# Patient Record
Sex: Female | Born: 1950 | Race: White | Hispanic: No | Smoking: Never smoker
Health system: Southern US, Community
[De-identification: ages and names within clinical notes are randomized; demographics above are authoritative.]

## PROBLEM LIST (undated history)

## (undated) DIAGNOSIS — E039 Hypothyroidism, unspecified: Secondary | ICD-10-CM

## (undated) DIAGNOSIS — G629 Polyneuropathy, unspecified: Secondary | ICD-10-CM

## (undated) DIAGNOSIS — I10 Essential (primary) hypertension: Secondary | ICD-10-CM

## (undated) DIAGNOSIS — E538 Deficiency of other specified B group vitamins: Secondary | ICD-10-CM

## (undated) DIAGNOSIS — F32A Depression, unspecified: Secondary | ICD-10-CM

## (undated) DIAGNOSIS — A419 Sepsis, unspecified organism: Secondary | ICD-10-CM

## (undated) DIAGNOSIS — F419 Anxiety disorder, unspecified: Secondary | ICD-10-CM

## (undated) DIAGNOSIS — M549 Dorsalgia, unspecified: Secondary | ICD-10-CM

## (undated) DIAGNOSIS — E079 Disorder of thyroid, unspecified: Secondary | ICD-10-CM

## (undated) DIAGNOSIS — F329 Major depressive disorder, single episode, unspecified: Secondary | ICD-10-CM

## (undated) DIAGNOSIS — F319 Bipolar disorder, unspecified: Secondary | ICD-10-CM

## (undated) HISTORY — PX: GASTRIC BYPASS: SHX52

## (undated) HISTORY — PX: HERNIA REPAIR: SHX51

## (undated) HISTORY — PX: OTHER SURGICAL HISTORY: SHX169

## (undated) HISTORY — PX: CHOLECYSTECTOMY: SHX55

## (undated) HISTORY — PX: ABDOMINAL HYSTERECTOMY: SHX81

## (undated) HISTORY — PX: LUMBAR FUSION: SHX111

---

## 2013-04-28 ENCOUNTER — Emergency Department: Payer: Self-pay | Admitting: Emergency Medicine

## 2013-04-28 LAB — COMPREHENSIVE METABOLIC PANEL
Albumin: 1.8 g/dL — ABNORMAL LOW (ref 3.4–5.0)
Alkaline Phosphatase: 183 U/L — ABNORMAL HIGH (ref 50–136)
Anion Gap: 7 (ref 7–16)
BUN: 10 mg/dL (ref 7–18)
Bilirubin,Total: 0.4 mg/dL (ref 0.2–1.0)
Calcium, Total: 8.1 mg/dL — ABNORMAL LOW (ref 8.5–10.1)
Chloride: 103 mmol/L (ref 98–107)
Co2: 31 mmol/L (ref 21–32)
Creatinine: 1.26 mg/dL (ref 0.60–1.30)
EGFR (African American): 54 — ABNORMAL LOW
EGFR (Non-African Amer.): 47 — ABNORMAL LOW
Glucose: 139 mg/dL — ABNORMAL HIGH (ref 65–99)
Osmolality: 283 (ref 275–301)
Potassium: 3.6 mmol/L (ref 3.5–5.1)
SGOT(AST): 22 U/L (ref 15–37)
SGPT (ALT): 21 U/L (ref 12–78)
Sodium: 141 mmol/L (ref 136–145)
Total Protein: 5.2 g/dL — ABNORMAL LOW (ref 6.4–8.2)

## 2013-04-28 LAB — CBC
HCT: 32.9 % — ABNORMAL LOW (ref 35.0–47.0)
HGB: 11.3 g/dL — ABNORMAL LOW (ref 12.0–16.0)
MCH: 32.2 pg (ref 26.0–34.0)
MCHC: 34.3 g/dL (ref 32.0–36.0)
MCV: 94 fL (ref 80–100)
Platelet: 339 10*3/uL (ref 150–440)
RBC: 3.49 10*6/uL — ABNORMAL LOW (ref 3.80–5.20)
RDW: 14.6 % — ABNORMAL HIGH (ref 11.5–14.5)
WBC: 6.9 10*3/uL (ref 3.6–11.0)

## 2013-04-28 LAB — URINALYSIS, COMPLETE
Bilirubin,UR: NEGATIVE
Blood: NEGATIVE
Glucose,UR: NEGATIVE mg/dL (ref 0–75)
Ketone: NEGATIVE
Nitrite: NEGATIVE
Ph: 6 (ref 4.5–8.0)
Protein: NEGATIVE
RBC,UR: 1 /HPF (ref 0–5)
Specific Gravity: 1.011 (ref 1.003–1.030)
Squamous Epithelial: 10
WBC UR: 47 /HPF (ref 0–5)

## 2013-04-28 LAB — LIPASE, BLOOD: Lipase: 46 U/L — ABNORMAL LOW (ref 73–393)

## 2013-10-25 ENCOUNTER — Ambulatory Visit: Payer: Self-pay | Admitting: Gastroenterology

## 2013-11-26 ENCOUNTER — Ambulatory Visit: Payer: Self-pay

## 2015-03-28 ENCOUNTER — Ambulatory Visit: Admit: 2015-03-28 | Disposition: A | Discharge status: Home / Self Care | Payer: Self-pay

## 2016-10-03 ENCOUNTER — Other Ambulatory Visit: Payer: Self-pay | Admitting: Internal Medicine

## 2016-10-03 DIAGNOSIS — Z1231 Encounter for screening mammogram for malignant neoplasm of breast: Secondary | ICD-10-CM

## 2016-10-04 ENCOUNTER — Other Ambulatory Visit: Payer: Self-pay | Admitting: Internal Medicine

## 2016-10-04 ENCOUNTER — Ambulatory Visit
Admission: RE | Admit: 2016-10-04 | Discharge: 2016-10-04 | Disposition: A | Discharge status: Home / Self Care | Payer: Medicare PPO | Source: Ambulatory Visit | Attending: Internal Medicine | Admitting: Internal Medicine

## 2016-10-04 DIAGNOSIS — Z1231 Encounter for screening mammogram for malignant neoplasm of breast: Secondary | ICD-10-CM

## 2017-04-28 ENCOUNTER — Observation Stay
Admission: EM | Admit: 2017-04-28 | Discharge: 2017-04-30 | Disposition: A | Discharge status: Home / Self Care | Payer: Medicare PPO | Attending: Internal Medicine | Admitting: Internal Medicine

## 2017-04-28 ENCOUNTER — Emergency Department: Payer: Medicare PPO

## 2017-04-28 ENCOUNTER — Encounter: Payer: Self-pay | Admitting: Emergency Medicine

## 2017-04-28 DIAGNOSIS — Z79899 Other long term (current) drug therapy: Secondary | ICD-10-CM | POA: Diagnosis not present

## 2017-04-28 DIAGNOSIS — G8929 Other chronic pain: Secondary | ICD-10-CM | POA: Diagnosis not present

## 2017-04-28 DIAGNOSIS — Z888 Allergy status to other drugs, medicaments and biological substances status: Secondary | ICD-10-CM | POA: Insufficient documentation

## 2017-04-28 DIAGNOSIS — D500 Iron deficiency anemia secondary to blood loss (chronic): Secondary | ICD-10-CM | POA: Insufficient documentation

## 2017-04-28 DIAGNOSIS — Z880 Allergy status to penicillin: Secondary | ICD-10-CM | POA: Diagnosis not present

## 2017-04-28 DIAGNOSIS — Z801 Family history of malignant neoplasm of trachea, bronchus and lung: Secondary | ICD-10-CM | POA: Insufficient documentation

## 2017-04-28 DIAGNOSIS — K625 Hemorrhage of anus and rectum: Secondary | ICD-10-CM | POA: Diagnosis not present

## 2017-04-28 DIAGNOSIS — Z7982 Long term (current) use of aspirin: Secondary | ICD-10-CM | POA: Insufficient documentation

## 2017-04-28 DIAGNOSIS — Z9884 Bariatric surgery status: Secondary | ICD-10-CM | POA: Diagnosis not present

## 2017-04-28 DIAGNOSIS — I700 Atherosclerosis of aorta: Secondary | ICD-10-CM | POA: Insufficient documentation

## 2017-04-28 DIAGNOSIS — Z9049 Acquired absence of other specified parts of digestive tract: Secondary | ICD-10-CM | POA: Insufficient documentation

## 2017-04-28 DIAGNOSIS — K5730 Diverticulosis of large intestine without perforation or abscess without bleeding: Secondary | ICD-10-CM | POA: Diagnosis not present

## 2017-04-28 DIAGNOSIS — K449 Diaphragmatic hernia without obstruction or gangrene: Secondary | ICD-10-CM | POA: Diagnosis not present

## 2017-04-28 DIAGNOSIS — M549 Dorsalgia, unspecified: Secondary | ICD-10-CM | POA: Insufficient documentation

## 2017-04-28 DIAGNOSIS — K640 First degree hemorrhoids: Secondary | ICD-10-CM | POA: Insufficient documentation

## 2017-04-28 DIAGNOSIS — E039 Hypothyroidism, unspecified: Secondary | ICD-10-CM | POA: Insufficient documentation

## 2017-04-28 DIAGNOSIS — K922 Gastrointestinal hemorrhage, unspecified: Secondary | ICD-10-CM | POA: Diagnosis present

## 2017-04-28 DIAGNOSIS — E785 Hyperlipidemia, unspecified: Secondary | ICD-10-CM | POA: Insufficient documentation

## 2017-04-28 DIAGNOSIS — D649 Anemia, unspecified: Secondary | ICD-10-CM | POA: Diagnosis present

## 2017-04-28 DIAGNOSIS — Z90710 Acquired absence of both cervix and uterus: Secondary | ICD-10-CM | POA: Diagnosis not present

## 2017-04-28 DIAGNOSIS — F329 Major depressive disorder, single episode, unspecified: Secondary | ICD-10-CM | POA: Insufficient documentation

## 2017-04-28 HISTORY — DX: Major depressive disorder, single episode, unspecified: F32.9

## 2017-04-28 HISTORY — DX: Disorder of thyroid, unspecified: E07.9

## 2017-04-28 HISTORY — DX: Dorsalgia, unspecified: M54.9

## 2017-04-28 HISTORY — DX: Depression, unspecified: F32.A

## 2017-04-28 LAB — COMPREHENSIVE METABOLIC PANEL
ALT: 16 U/L (ref 14–54)
AST: 19 U/L (ref 15–41)
Albumin: 2.9 g/dL — ABNORMAL LOW (ref 3.5–5.0)
Alkaline Phosphatase: 81 U/L (ref 38–126)
Anion gap: 8 (ref 5–15)
BUN: 41 mg/dL — ABNORMAL HIGH (ref 6–20)
CO2: 22 mmol/L (ref 22–32)
Calcium: 7.9 mg/dL — ABNORMAL LOW (ref 8.9–10.3)
Chloride: 105 mmol/L (ref 101–111)
Creatinine, Ser: 1.36 mg/dL — ABNORMAL HIGH (ref 0.44–1.00)
GFR calc Af Amer: 47 mL/min — ABNORMAL LOW (ref >60)
GFR calc non Af Amer: 40 mL/min — ABNORMAL LOW (ref >60)
Glucose, Bld: 223 mg/dL — ABNORMAL HIGH (ref 65–99)
Potassium: 4.3 mmol/L (ref 3.5–5.1)
Sodium: 135 mmol/L (ref 135–145)
Total Bilirubin: 0.6 mg/dL (ref 0.3–1.2)
Total Protein: 5.5 g/dL — ABNORMAL LOW (ref 6.5–8.1)

## 2017-04-28 LAB — CBC WITH DIFFERENTIAL/PLATELET
Basophils Absolute: 0 10*3/uL (ref 0–0.1)
Basophils Relative: 0 %
Eosinophils Absolute: 0.1 10*3/uL (ref 0–0.7)
Eosinophils Relative: 0 %
HCT: 27 % — ABNORMAL LOW (ref 35.0–47.0)
Hemoglobin: 8.9 g/dL — ABNORMAL LOW (ref 12.0–16.0)
Lymphocytes Relative: 9 %
Lymphs Abs: 1.3 10*3/uL (ref 1.0–3.6)
MCH: 31.5 pg (ref 26.0–34.0)
MCHC: 33 g/dL (ref 32.0–36.0)
MCV: 95.4 fL (ref 80.0–100.0)
Monocytes Absolute: 0.7 10*3/uL (ref 0.2–0.9)
Monocytes Relative: 5 %
Neutro Abs: 12.7 10*3/uL — ABNORMAL HIGH (ref 1.4–6.5)
Neutrophils Relative %: 86 %
Platelets: 348 10*3/uL (ref 150–440)
RBC: 2.83 MIL/uL — ABNORMAL LOW (ref 3.80–5.20)
RDW: 12.7 % (ref 11.5–14.5)
WBC: 14.8 10*3/uL — ABNORMAL HIGH (ref 3.6–11.0)

## 2017-04-28 LAB — URINALYSIS, COMPLETE (UACMP) WITH MICROSCOPIC
Bacteria, UA: NONE SEEN
Bilirubin Urine: NEGATIVE
Glucose, UA: NEGATIVE mg/dL
Ketones, ur: NEGATIVE mg/dL
Leukocytes, UA: NEGATIVE
Nitrite: NEGATIVE
Protein, ur: NEGATIVE mg/dL
RBC / HPF: NONE SEEN RBC/hpf (ref 0–5)
Specific Gravity, Urine: 1.014 (ref 1.005–1.030)
pH: 5 (ref 5.0–8.0)

## 2017-04-28 LAB — PREPARE RBC (CROSSMATCH)

## 2017-04-28 LAB — TROPONIN I: Troponin I: <0.03 ng/mL (ref <0.03)

## 2017-04-28 LAB — LIPASE, BLOOD: Lipase: 17 U/L (ref 11–51)

## 2017-04-28 LAB — ABO/RH: ABO/RH(D): O POS

## 2017-04-28 LAB — MRSA PCR SCREENING: MRSA by PCR: NEGATIVE

## 2017-04-28 MED ORDER — PANCRELIPASE (LIP-PROT-AMYL) 12000-38000 UNITS PO CPEP
24000.0000 [IU] | ORAL_CAPSULE | Freq: Three times a day (TID) | ORAL | Status: DC
  Administered 2017-04-29 – 2017-04-30 (×4): 24000 [IU] via ORAL
  Filled 2017-04-28 (×6): qty 2

## 2017-04-28 MED ORDER — CYANOCOBALAMIN 1000 MCG/ML IJ SOLN
1000.0000 ug | INTRAMUSCULAR | Status: DC
Start: 2017-05-16 — End: 2017-04-30

## 2017-04-28 MED ORDER — VENLAFAXINE HCL ER 75 MG PO CP24
150.0000 mg | ORAL_CAPSULE | Freq: Every evening | ORAL | Status: DC
  Administered 2017-04-28 – 2017-04-29 (×2): 150 mg via ORAL
  Filled 2017-04-28 (×2): qty 2

## 2017-04-28 MED ORDER — LEVOTHYROXINE SODIUM 25 MCG PO TABS
25.0000 ug | ORAL_TABLET | Freq: Every day | ORAL | Status: DC
  Administered 2017-04-29 – 2017-04-30 (×2): 25 ug via ORAL
  Filled 2017-04-28 (×2): qty 1

## 2017-04-28 MED ORDER — SODIUM CHLORIDE 0.9 % IV SOLN
Freq: Once | INTRAVENOUS | Status: AC
  Administered 2017-04-28: 13:00:00 via INTRAVENOUS

## 2017-04-28 MED ORDER — DOCUSATE SODIUM 100 MG PO CAPS: 100.0000 mg | ORAL_CAPSULE | Freq: Two times a day (BID) | ORAL | Status: DC | PRN

## 2017-04-28 MED ORDER — IOPAMIDOL (ISOVUE-300) INJECTION 61%
75.0000 mL | Freq: Once | INTRAVENOUS | Status: AC | PRN
  Administered 2017-04-28: 75 mL via INTRAVENOUS

## 2017-04-28 MED ORDER — CALCIUM CARBONATE-VITAMIN D 500-200 MG-UNIT PO TABS
1.0000 | ORAL_TABLET | Freq: Two times a day (BID) | ORAL | Status: DC
  Administered 2017-04-28 – 2017-04-30 (×4): 1 via ORAL
  Filled 2017-04-28 (×4): qty 1

## 2017-04-28 MED ORDER — RISPERIDONE 3 MG PO TABS
4.0000 mg | ORAL_TABLET | Freq: Every evening | ORAL | Status: DC
  Filled 2017-04-28 (×2): qty 1

## 2017-04-28 MED ORDER — IOPAMIDOL (ISOVUE-300) INJECTION 61%
30.0000 mL | Freq: Once | INTRAVENOUS | Status: AC | PRN
  Administered 2017-04-28: 30 mL via ORAL

## 2017-04-28 MED ORDER — CALCIUM CARBONATE ANTACID 500 MG PO CHEW
1.0000 | CHEWABLE_TABLET | Freq: Once | ORAL | Status: AC
  Administered 2017-04-29: 200 mg via ORAL
  Filled 2017-04-28: qty 1

## 2017-04-28 MED ORDER — SODIUM CHLORIDE 0.9 % IV SOLN
INTRAVENOUS | Status: DC
Start: 2017-04-28 — End: 2017-04-29
  Administered 2017-04-28: 14:00:00 via INTRAVENOUS

## 2017-04-28 MED ORDER — PROMETHAZINE HCL 25 MG PO TABS
25.0000 mg | ORAL_TABLET | Freq: Four times a day (QID) | ORAL | Status: DC | PRN
  Administered 2017-04-29 – 2017-04-30 (×2): 25 mg via ORAL
  Filled 2017-04-28 (×2): qty 1

## 2017-04-28 MED ORDER — MORPHINE SULFATE (PF) 2 MG/ML IV SOLN
2.0000 mg | INTRAVENOUS | Status: DC | PRN
  Administered 2017-04-28 – 2017-04-29 (×4): 2 mg via INTRAVENOUS
  Filled 2017-04-28 (×4): qty 1

## 2017-04-28 MED ORDER — LORAZEPAM 0.5 MG PO TABS
0.5000 mg | ORAL_TABLET | Freq: Two times a day (BID) | ORAL | Status: DC
  Administered 2017-04-29 (×2): 0.5 mg via ORAL
  Filled 2017-04-28 (×4): qty 1

## 2017-04-28 MED ORDER — MAGNESIUM OXIDE 400 (241.3 MG) MG PO TABS
400.0000 mg | ORAL_TABLET | Freq: Every day | ORAL | Status: DC
Start: 2017-04-28 — End: 2017-04-30
  Administered 2017-04-29: 16:00:00 400 mg via ORAL
  Filled 2017-04-28: qty 1

## 2017-04-28 MED ORDER — PANTOPRAZOLE SODIUM 40 MG IV SOLR
40.0000 mg | Freq: Two times a day (BID) | INTRAVENOUS | Status: DC
Start: 2017-04-28 — End: 2017-04-30
  Administered 2017-04-28 – 2017-04-30 (×5): 40 mg via INTRAVENOUS
  Filled 2017-04-28 (×5): qty 40

## 2017-04-28 MED ORDER — LORAZEPAM 0.5 MG PO TABS: 0.5000 mg | ORAL_TABLET | Freq: Four times a day (QID) | ORAL | Status: DC

## 2017-04-28 MED ORDER — LORAZEPAM 1 MG PO TABS
1.0000 mg | ORAL_TABLET | Freq: Two times a day (BID) | ORAL | Status: DC
  Administered 2017-04-28 – 2017-04-30 (×4): 1 mg via ORAL
  Filled 2017-04-28 (×3): qty 1

## 2017-04-28 MED ORDER — GABAPENTIN 100 MG PO CAPS
100.0000 mg | ORAL_CAPSULE | Freq: Two times a day (BID) | ORAL | Status: DC
  Administered 2017-04-28 – 2017-04-30 (×4): 100 mg via ORAL
  Filled 2017-04-28 (×4): qty 1

## 2017-04-28 NOTE — ED Provider Notes (Signed)
[facility] Emergency Department Provider Note       Time seen: ----------------------------------------- 10:11 AM on 04/28/2017 -----------------------------------------     I have reviewed the triage vital signs and the nursing notes.   HISTORY   Chief Complaint No chief complaint on file.    HPI Jacqueline Williams is a 66 y.o. female who presents to the ED for abdominal pain and rectal bleeding. Patient feels dizzy and weak. Reportedly this is happened to her once in the remote past of uncertain etiology. She had lost significant amount of blood she states and required blood transfusion. She has not had any recent bleeding and she only takes an aspirin daily. She denies fevers, chills or other complaints. She was having left lower quadrant pain earlier.   No past medical history on file.  There are no active problems to display for this patient.   No past surgical history on file.  Allergies Patient has no allergy information on record.  Social History Social History  Substance Use Topics  . Smoking status: Not on file  . Smokeless tobacco: Not on file  . Alcohol use Not on file    Review of Systems Constitutional: Negative for fever. Eyes: Negative for vision changes ENT:  Negative for congestion, sore throat Cardiovascular: Negative for chest pain. Respiratory: Negative for shortness of breath. Gastrointestinal:Positive for abdominal pain, rectal bleeding Genitourinary: Negative for dysuria. Musculoskeletal: Negative for back pain. Skin: Negative for rash. Neurological: Positive for weakness and dizziness  All systems negative/normal/unremarkable except as stated in the HPI  ____________________________________________   PHYSICAL EXAM:  VITAL SIGNS: ED Triage Vitals  Enc Vitals Group     BP      Pulse      Resp      Temp      Temp src      SpO2      Weight      Height      Head Circumference      Peak Flow      Pain  Score      Pain Loc      Pain Edu?      Excl. in GC?     Constitutional: Alert and oriented. Mild distress Eyes: Conjunctivae are pale. PERRL. Normal extraocular movements. ENT   Head: Normocephalic and atraumatic.   Nose: No congestion/rhinnorhea.   Mouth/Throat: Mucous membranes are moist.   Neck: No stridor. Cardiovascular: Normal rate, regular rhythm. No murmurs, rubs, or gallops. Respiratory: Normal respiratory effort without tachypnea nor retractions. Breath sounds are clear and equal bilaterally. No wheezes/rales/rhonchi. Gastrointestinal: Soft and nontender. Normal bowel sounds Musculoskeletal: Nontender with normal range of motion in extremities. No lower extremity tenderness nor edema. Neurologic:  Normal speech and language. No gross focal neurologic deficits are appreciated.  Skin:  Skin is warm, dry and intact. Palor is noted. Psychiatric: Mood and affect are normal. Speech and behavior are normal.  ____________________________________________  EKG: Interpreted by me. Sinus tachycardia with a rate of 110 bpm, normal PR interval, normal QRS, normal QT.  ____________________________________________  ED COURSE:  Pertinent labs & imaging results that were available during my care of the patient were reviewed by me and considered in my medical decision making (see chart for details). Patient presents for weakness and GI bleeding, we will assess with labs and imaging as indicated.   Procedures ____________________________________________   LABS (pertinent positives/negatives)  Labs Reviewed  CBC WITH DIFFERENTIAL/PLATELET - Abnormal; Notable for the following:  Result Value   WBC 14.8 (*)    RBC 2.83 (*)    Hemoglobin 8.9 (*)    HCT 27.0 (*)    Neutro Abs 12.7 (*)    All other components within normal limits  COMPREHENSIVE METABOLIC PANEL - Abnormal; Notable for the following:    Glucose, Bld 223 (*)    BUN 41 (*)    Creatinine, Ser 1.36 (*)     Calcium 7.9 (*)    Total Protein 5.5 (*)    Albumin 2.9 (*)    GFR calc non Af Amer 40 (*)    GFR calc Af Amer 47 (*)    All other components within normal limits  LIPASE, BLOOD  TROPONIN I  URINALYSIS, COMPLETE (UACMP) WITH MICROSCOPIC  TYPE AND SCREEN  PREPARE RBC (CROSSMATCH)  ABO/RH   CRITICAL CARE Performed by: Emily FilbertWilliams, Jonathan E   Total critical care time: 30 minutes  Critical care time was exclusive of separately billable procedures and treating other patients.  Critical care was necessary to treat or prevent imminent or life-threatening deterioration.  Critical care was time spent personally by me on the following activities: development of treatment plan with patient and/or surrogate as well as nursing, discussions with consultants, evaluation of patient's response to treatment, examination of patient, obtaining history from patient or surrogate, ordering and performing treatments and interventions, ordering and review of laboratory studies, ordering and review of radiographic studies, pulse oximetry and re-evaluation of patient's condition.  RADIOLOGY  CT of the abdomen and pelvis with contrast is pending at this time  ____________________________________________  FINAL ASSESSMENT AND PLAN  Abdominal pain, rectal bleeding, near syncope  Plan: Patient's labs and imaging were dictated above. Patient had presented for Bright red blood per rectum and some lower abdominal pain. She is anemic requiring a blood transfusion. Likely in class II or class III hemorrhagic shock. I have ordered 2 units of blood to be transfused. Currently she is awake and alert and following commands. She has responded to a fluid challenge. CT scan is pending and I suspect underlying diverticular bleed. Family notes she was recently started on naproxen but has not really describe melena or tarry stools.   Emily FilbertWilliams, Jonathan E, MD   Note: This note was generated in part or whole with voice  recognition software. Voice recognition is usually quite accurate but there are transcription errors that can and very often do occur. I apologize for any typographical errors that were not detected and corrected.     Emily FilbertWilliams, Jonathan E, MD 04/28/17 [phone]

## 2017-04-28 NOTE — H&P (Signed)
[facility] [place] [facility]   PATIENT NAME: Jacqueline Williams    MR#:  [id number]  DATE OF BIRTH:  03/31/1951  DATE OF ADMISSION:  04/28/2017  PRIMARY CARE PHYSICIAN: Clovis Pu, Arturo Morton, DO   REQUESTING/REFERRING PHYSICIAN: Mayford Knife  CHIEF COMPLAINT:   Chief Complaint  Patient presents with  . GI Bleeding    HISTORY OF PRESENT ILLNESS: Jacqueline Williams  is a 66 y.o. female with a known history of Back pain, depression, hypothyroidism- does not take over-the-counter pain medications, today morning around 4:00 woke up with pain in left lower abdominal quadrant and started having loose bowel movement with blood she had 6 episodes at home and started feeling dizzy concerned with this she was brought to emergency room and noted to have a hemoglobin less than 9 where in remote past it was around 11 patient was also noted to be tachycardic and slightly hypotensive so ER physician ordered blood transfusion for symptomatic anemia and given to hospitalist team for further management.  PAST MEDICAL HISTORY:   Past Medical History:  Diagnosis Date  . Back pain   . Depression   . Thyroid disease     PAST SURGICAL HISTORY: Past Surgical History:  Procedure Laterality Date  . ABDOMINAL HYSTERECTOMY    . CHOLECYSTECTOMY    . gastric bipass    . HERNIA REPAIR      SOCIAL HISTORY:  Social History  Substance Use Topics  . Smoking status: Never Smoker  . Smokeless tobacco: Never Used  . Alcohol use No    FAMILY HISTORY:  Family History  Problem Relation Age of Onset  . Lung cancer Father     DRUG ALLERGIES:  Allergies  Allergen Reactions  . Other Shortness Of Breath    caffergot  . Penicillins   . Seroquel [Quetiapine Fumarate] Other (See Comments)    hallucinations    REVIEW OF SYSTEMS:   CONSTITUTIONAL: No fever, fatigue or weakness.  EYES: No blurred or double vision.  EARS, NOSE, AND THROAT: No tinnitus or ear pain.  RESPIRATORY: No cough, shortness of  breath, wheezing or hemoptysis.  CARDIOVASCULAR: No chest pain, orthopnea, edema.  GASTROINTESTINAL: No nausea, vomiting, Some blood with diarrhea and abdominal pain.  GENITOURINARY: No dysuria, hematuria.  ENDOCRINE: No polyuria, nocturia,  HEMATOLOGY: No anemia, easy bruising or bleeding SKIN: No rash or lesion. MUSCULOSKELETAL: No joint pain or arthritis.   NEUROLOGIC: No tingling, numbness, weakness.  PSYCHIATRY: No anxiety or depression.   MEDICATIONS AT HOME:  Prior to Admission medications   Medication Sig Start Date End Date Taking? Authorizing Provider  acetaminophen (TYLENOL) 500 MG tablet Take 500 mg by mouth every 6 (six) hours as needed.   Yes [provider]  aspirin EC 81 MG tablet Take 81 mg by mouth daily.   Yes [provider]  calcium-vitamin D (OSCAL WITH D) 500-200 MG-UNIT tablet Take 1 tablet by mouth.   Yes [provider]  cetirizine (ZYRTEC) 10 MG tablet Take 10 mg by mouth daily.   Yes [provider]  cyanocobalamin (,VITAMIN B-12,) 1000 MCG/ML injection Inject 1 mL into the skin every 30 (thirty) days. 04/16/17  Yes [provider]  diclofenac sodium (VOLTAREN) 1 % GEL Apply topically 4 (four) times daily. Prn arthritis pain   Yes [provider]  ferrous gluconate (FERGON) 324 MG tablet Take 324 mg by mouth daily with breakfast.   Yes [provider]  fluticasone (FLONASE) 50 MCG/ACT nasal spray Place 1  spray into both nostrils daily as needed for allergies or rhinitis.   Yes [provider]  gabapentin (NEURONTIN) 100 MG capsule Take 100 mg by mouth 2 (two) times daily. 02/25/17  Yes [provider]  lactase (LACTAID) 3000 units tablet Take by mouth 3 (three) times daily with meals.   Yes [provider]  levothyroxine (SYNTHROID, LEVOTHROID) 25 MCG tablet Take 25 mcg by mouth daily. 04/26/17  Yes [provider]  lipase/protease/amylase (CREON) 12000 units CPEP capsule  Take 24,000 Units by mouth 3 (three) times daily before meals.   Yes [provider]  loperamide (IMODIUM) 2 MG capsule Take by mouth as needed for diarrhea or loose stools.   Yes [provider]  LORazepam (ATIVAN) 1 MG tablet Take 0.5-1 mg by mouth 4 (four) times daily. 0.5 mg 0630,  1 mg 1100, 0.5 mg 1600, 1 mg 2030 04/05/17  Yes [provider]  magnesium oxide (MAG-OX) 400 MG tablet Take 400 mg by mouth daily.   Yes [provider]  oxybutynin (DITROPAN) 5 MG tablet Take 5 mg by mouth 2 (two) times daily.   Yes [provider]  oxyCODONE-acetaminophen (PERCOCET) 5-325 MG tablet Take 1 tablet by mouth 4 (four) times daily. 0630,1100,1600,2030   Yes [provider]  pantoprazole (PROTONIX) 40 MG tablet Take 40 mg by mouth daily.   Yes [provider]  promethazine (PHENERGAN) 25 MG tablet Take 25 mg by mouth every 6 (six) hours as needed. 04/28/17  Yes [provider]  risperidone (RISPERDAL) 4 MG tablet Take 4 mg by mouth daily. 03/11/17  Yes [provider]  simethicone (MYLICON) 80 MG chewable tablet Chew 80 mg by mouth every 6 (six) hours as needed for flatulence.   Yes [provider]  traZODone (DESYREL) 100 MG tablet Take 100 mg by mouth at bedtime. 04/14/17  Yes [provider]  venlafaxine XR (EFFEXOR-XR) 150 MG 24 hr capsule Take 150 mg by mouth daily. 02/25/17  Yes [provider]      PHYSICAL EXAMINATION:   VITAL SIGNS: Blood pressure 111/64, pulse 96, temperature 98.7 F (37.1 C), temperature source Oral, resp. rate 15, height 5\' 7"  (1.702 m), weight 90.7 kg (200 lb), SpO2 97 %.  GENERAL:  66 y.o.-year-old patient lying in the bed with no acute distress.  EYES: Pupils equal, round, reactive to light and accommodation. No scleral icterus. Extraocular muscles intact.  HEENT: Head atraumatic, normocephalic. Oropharynx and nasopharynx clear.  NECK:  Supple, no jugular venous  distention. No thyroid enlargement, no tenderness.  LUNGS: Normal breath sounds bilaterally, no wheezing, rales,rhonchi or crepitation. No use of accessory muscles of respiration.  CARDIOVASCULAR: S1, S2 normal. No murmurs, rubs, or gallops.  ABDOMEN: Soft, Mild left lower tender, nondistended. Bowel sounds present. No organomegaly or mass.  EXTREMITIES: No pedal edema, cyanosis, or clubbing.  NEUROLOGIC: Cranial nerves II through XII are intact. Muscle strength 5/5 in all extremities. Sensation intact. Gait not checked.  PSYCHIATRIC: The patient is alert and oriented x 3.  SKIN: No obvious rash, lesion, or ulcer.   LABORATORY PANEL:   CBC  Recent Labs Lab 04/28/17 1016  WBC 14.8*  HGB 8.9*  HCT 27.0*  PLT 348  MCV 95.4  MCH 31.5  MCHC 33.0  RDW 12.7  LYMPHSABS 1.3  MONOABS 0.7  EOSABS 0.1  BASOSABS 0.0   ------------------------------------------------------------------------------------------------------------------  Chemistries   Recent Labs Lab 04/28/17 1016  NA 135  K 4.3  CL 105  CO2  22  GLUCOSE 223*  BUN 41*  CREATININE 1.36*  CALCIUM 7.9*  AST 19  ALT 16  ALKPHOS 81  BILITOT 0.6   ------------------------------------------------------------------------------------------------------------------ estimated creatinine clearance is 48.9 mL/min (A) (by C-G formula based on SCr of 1.36 mg/dL (H)). ------------------------------------------------------------------------------------------------------------------ No results for input(s): TSH, T4TOTAL, T3FREE, THYROIDAB in the last 72 hours.  Invalid input(s): FREET3   Coagulation profile No results for input(s): INR, PROTIME in the last 168 hours. ------------------------------------------------------------------------------------------------------------------- No results for input(s): DDIMER in the last 72  hours. -------------------------------------------------------------------------------------------------------------------  Cardiac Enzymes  Recent Labs Lab 04/28/17 1016  TROPONINI <0.03   ------------------------------------------------------------------------------------------------------------------ Invalid input(s): POCBNP  ---------------------------------------------------------------------------------------------------------------  Urinalysis    Component Value Date/Time   COLORURINE Yellow 04/28/2013 2126   APPEARANCEUR Hazy 04/28/2013 2126   LABSPEC 1.011 04/28/2013 2126   PHURINE 6.0 04/28/2013 2126   GLUCOSEU Negative 04/28/2013 2126   HGBUR Negative 04/28/2013 2126   BILIRUBINUR Negative 04/28/2013 2126   KETONESUR Negative 04/28/2013 2126   PROTEINUR Negative 04/28/2013 2126   NITRITE Negative 04/28/2013 2126   LEUKOCYTESUR 3+ 04/28/2013 2126     RADIOLOGY: Ct Abdomen Pelvis W Contrast  Result Date: 04/28/2017 CLINICAL DATA:  Left-sided abdominal pain, bright red stools. Possible GI bleed. EXAM: CT ABDOMEN AND PELVIS WITH CONTRAST TECHNIQUE: Multidetector CT imaging of the abdomen and pelvis was performed using the standard protocol following bolus administration of intravenous contrast. CONTRAST:  75mL ISOVUE-300 IOPAMIDOL (ISOVUE-300) INJECTION 61% COMPARISON:  04/29/2013. FINDINGS: Lower chest: Lung bases show no acute findings. Heart size normal. No pericardial or pleural effusion. Fluid-filled distal esophagus with a small hiatal hernia. Hepatobiliary: Liver is unremarkable. Cholecystectomy. No biliary ductal dilatation. Pancreas: Negative. Spleen: Negative. Adrenals/Urinary Tract: Adrenal glands and kidneys are unremarkable. Ureters are decompressed. Bladder is grossly unremarkable. Stomach/Bowel: Postoperative changes of gastric bypass. There is dilatation of the distal duodenum and proximal jejunum to the level of an anastomotic suture line within the left  paramidline low abdomen (series 2, image 47). Findings may be similar to 04/29/2013. Small bowel and colon are otherwise unremarkable. Appendix not readily visualized. Vascular/Lymphatic: Mild atherosclerotic calcification of the arterial vasculature. Retroaortic left renal vein. No pathologically enlarged lymph nodes. Reproductive: Hysterectomy.  No adnexal mass. Other: No free fluid.  Mesenteries and peritoneum are unremarkable. Musculoskeletal: No worrisome lytic or sclerotic lesions. L5-S1 PLIF. IMPRESSION: 1. Dilatation of distal duodenal and proximal jejunal loops, to the level of a small bowel anastomosis in the left paramidline lower abdomen, somewhat similar to the prior exam, suggesting atony. Partial obstruction is not excluded. 2.  Aortic atherosclerosis (ICD10-170.0). Electronically Signed   By: Leanna Battles M.D.   On: 04/28/2017 12:20    EKG: Orders placed or performed during the hospital encounter of 04/28/17  . ED EKG  . ED EKG  . EKG 12-Lead  . EKG 12-Lead    IMPRESSION AND PLAN:  * GI bleed, likely lower   Symptomatic anemia    Blood transfusion ordered by ER physician, will give IV fluid and Protonix IV.   Keep on Lipitor for now, mostly the bleeding would be diverticular but will wait for improvement.   If continued to feel we may need to call GI consult.  * Hypothyroidism   Continue levothyroxine  * Chronic pain and depression   Currently we will hold oral medications with complain of GI bleed.   All the records are reviewed and case discussed with ED provider. Management plans discussed with the patient, family and they are in agreement.  CODE STATUS:  Full Code Status History    This patient does not have a recorded code status. Please follow your organizational policy for patients in this situation.     Son was present in room.  TOTAL TIME TAKING CARE OF THIS PATIENT: 45 minutes.    Altamese Dilling M.D on 04/28/2017   Between 7am to 6pm - Pager  - [phone]  After 6pm go to [email or web address] - password [email or web address] [facility]  [facility]  Office  [phone]  CC: Primary care physician; Clovis Pu, Arturo Morton, DO   Note: This dictation was prepared with Dragon dictation along with smaller phrase technology. Any transcriptional errors that result from this process are unintentional.

## 2017-04-28 NOTE — Progress Notes (Signed)
Per Dr. Elisabeth PigeonVachhani, ok to hold IVF until blood transfusions are complete then restart fluids as ordered.

## 2017-04-28 NOTE — ED Triage Notes (Signed)
Ems from [facility] for GI bleed. Pt awakened at 0400 this am with left abd pain. Pt started having bright and dark red stools (6) since then.

## 2017-04-28 NOTE — ED Notes (Signed)
Pt states hx GI bleed 15 years ago with blood transfusion.   Attempted to call report, Rn is currently giving report on another pt. Was informed to call back in 5 minutes.

## 2017-04-28 NOTE — ED Notes (Signed)
Pt in CT scan via stretcher

## 2017-04-29 DIAGNOSIS — K625 Hemorrhage of anus and rectum: Secondary | ICD-10-CM | POA: Diagnosis not present

## 2017-04-29 LAB — BASIC METABOLIC PANEL
Anion gap: 8 (ref 5–15)
BUN: 27 mg/dL — ABNORMAL HIGH (ref 6–20)
CO2: 25 mmol/L (ref 22–32)
Calcium: 8.6 mg/dL — ABNORMAL LOW (ref 8.9–10.3)
Chloride: 106 mmol/L (ref 101–111)
Creatinine, Ser: 1 mg/dL (ref 0.44–1.00)
GFR calc Af Amer: >60 mL/min (ref >60)
GFR calc non Af Amer: 59 mL/min — ABNORMAL LOW (ref >60)
Glucose, Bld: 125 mg/dL — ABNORMAL HIGH (ref 65–99)
Potassium: 4.1 mmol/L (ref 3.5–5.1)
Sodium: 139 mmol/L (ref 135–145)

## 2017-04-29 LAB — HEMOGLOBIN AND HEMATOCRIT, BLOOD
HCT: 28.3 % — ABNORMAL LOW (ref 35.0–47.0)
HCT: 29.2 % — ABNORMAL LOW (ref 35.0–47.0)
Hemoglobin: 9.7 g/dL — ABNORMAL LOW (ref 12.0–16.0)
Hemoglobin: 10.1 g/dL — ABNORMAL LOW (ref 12.0–16.0)

## 2017-04-29 LAB — CBC
HCT: 29.3 % — ABNORMAL LOW (ref 35.0–47.0)
Hemoglobin: 10 g/dL — ABNORMAL LOW (ref 12.0–16.0)
MCH: 30.9 pg (ref 26.0–34.0)
MCHC: 34.1 g/dL (ref 32.0–36.0)
MCV: 90.6 fL (ref 80.0–100.0)
Platelets: 305 10*3/uL (ref 150–440)
RBC: 3.24 MIL/uL — ABNORMAL LOW (ref 3.80–5.20)
RDW: 16.1 % — ABNORMAL HIGH (ref 11.5–14.5)
WBC: 12.2 10*3/uL — ABNORMAL HIGH (ref 3.6–11.0)

## 2017-04-29 MED ORDER — PEG 3350-KCL-NA BICARB-NACL 420 G PO SOLR
4000.0000 mL | Freq: Once | ORAL | Status: AC
  Administered 2017-04-29: 4000 mL via ORAL
  Filled 2017-04-29: qty 4000

## 2017-04-29 MED ORDER — SODIUM CHLORIDE 0.9 % IV SOLN
INTRAVENOUS | Status: DC
  Administered 2017-04-29: 19:00:00 via INTRAVENOUS

## 2017-04-29 MED ORDER — OXYCODONE-ACETAMINOPHEN 5-325 MG PO TABS
1.0000 | ORAL_TABLET | Freq: Four times a day (QID) | ORAL | Status: DC | PRN
  Administered 2017-04-29 – 2017-04-30 (×2): 1 via ORAL
  Filled 2017-04-29 (×2): qty 1

## 2017-04-29 MED ORDER — MORPHINE SULFATE (PF) 2 MG/ML IV SOLN
2.0000 mg | INTRAVENOUS | Status: DC | PRN
  Administered 2017-04-30: 2 mg via INTRAVENOUS
  Filled 2017-04-29: qty 1

## 2017-04-29 MED ORDER — RISPERIDONE 1 MG PO TABS
4.0000 mg | ORAL_TABLET | Freq: Every evening | ORAL | Status: DC
  Administered 2017-04-29: 4 mg via ORAL
  Filled 2017-04-29 (×3): qty 4

## 2017-04-29 MED ORDER — OXYBUTYNIN CHLORIDE 5 MG PO TABS
5.0000 mg | ORAL_TABLET | Freq: Two times a day (BID) | ORAL | Status: DC
  Administered 2017-04-29 – 2017-04-30 (×3): 5 mg via ORAL
  Filled 2017-04-29 (×3): qty 1

## 2017-04-29 MED ORDER — ACETAMINOPHEN 325 MG PO TABS: 650.0000 mg | ORAL_TABLET | Freq: Four times a day (QID) | ORAL | Status: DC | PRN

## 2017-04-29 MED ORDER — TRAZODONE HCL 100 MG PO TABS
100.0000 mg | ORAL_TABLET | Freq: Every day | ORAL | Status: DC
  Administered 2017-04-29: 100 mg via ORAL
  Filled 2017-04-29: qty 1

## 2017-04-29 NOTE — Progress Notes (Signed)
Inpatient Diabetes Program Recommendations  AACE/ADA: New Consensus Statement on Inpatient Glycemic Control (2015)  Target Ranges:  Prepandial:   less than 140 mg/dL      Peak postprandial:   less than 180 mg/dL (1-2 hours)      Critically ill patients:  140 - 180 mg/dL   Results for Jacqueline Williams, [name] (MRN [id number]) as of 04/29/2017 09:26  Ref. Range 04/28/2017 10:16 04/29/2017 07:40  Glucose Latest Ref Range: 65 - 99 mg/dL 409223 (H) 811125 (H)   Review of Glycemic Control  Diabetes history: No Outpatient Diabetes medications: NA Current orders for Inpatient glycemic control: None  Inpatient Diabetes Program Recommendations: Correction (SSI): Initial lab glucose 223 mg/dl on 9/1/475/7/18 and fasting 829125 mg/dl today. While inpatient, please consider ordering CBGs with Novolog sensitive correction ACHS. HgbA1C: If A1C ordered at this point, will not be accurate due to low hemoglobin and blood transfusion. Would recommend patient follow up with PCP regarding glycemic control and evaluation of A1C.  Thanks, Orlando PennerMarie Hassel Uphoff, RN, MSN, CDE Diabetes Coordinator Inpatient Diabetes Program [phone] (Team Pager from 8am to 5pm)

## 2017-04-29 NOTE — Care Management Note (Signed)
Case Management Note  Patient Details  Name: Jacqueline Williams MRN: [id number] Date of Birth: [birth year]  Subjective/Objective:         Admitted to this facility with the diagnosis of GI bleed under observation status. [facility] 6 [facility]. Dr. Clarice PoleVan Horne with [facility] last seen [name] about a month ago. Also, seen Dr, Joseph ArtWoods. Did have physical therapy per Home Health, but doesn't remember name of agency. Sk[facility] Nursing [facility] [place] prior to moving here. No home oxygen. Rolling walker and shower chair in apartment. [facility] system in place. Eats   All meals in the dining room. Takes care of all basic activities of daily living herself, doesn't drive. Daughter helps with errands. Prescriptions are filled at [facility]. No falls. Great appetite. Daughter is Jacqueline Lofflerrystal Williams [phone]). Daughter will transport.         Action/Plan: GI bleed work up in progress   Expected Discharge Date:                  Expected Discharge Plan:     In-House Referral:     Discharge planning Services     Post Acute Care Choice:    Choice offered to:     DME Arranged:    DME Agency:     HH Arranged:    HH Agency:     Status of Service:     If discussed at [employer] Meetings, dates discussed:    Additional Comments: No needs identified at this time. Will continue to follow  Gwenette GreetBrenda S [place], RN MSN CCM Care Management [phone] 04/29/2017, 9:01 AM

## 2017-04-29 NOTE — Care Management Obs Status (Signed)
MEDICARE OBSERVATION STATUS NOTIFICATION   Patient Details  Name: Jacqueline MallickKay W Tauer MRN: [id number] Date of Birth: 13-Apr-1951   Medicare Observation Status Notification Given:  Yes    Gwenette GreetBrenda S Ninette Cotta, RN 04/29/2017, 9:10 AM

## 2017-04-29 NOTE — Progress Notes (Signed)
[facility] -  at [facility]   PATIENT NAME: Jacqueline Williams    MR#:  [id number]  DATE OF BIRTH:  Mar 15, 1951  SUBJECTIVE:  CHIEF COMPLAINT:  Patient denies any other episodes of bleeding. No abdominal pain. No nausea. Tolerating clear liquids  REVIEW OF SYSTEMS:  CONSTITUTIONAL: No fever, fatigue or weakness.  EYES: No blurred or double vision.  EARS, NOSE, AND THROAT: No tinnitus or ear pain.  RESPIRATORY: No cough, shortness of breath, wheezing or hemoptysis.  CARDIOVASCULAR: No chest pain, orthopnea, edema.  GASTROINTESTINAL: No nausea, vomiting, diarrhea or abdominal pain.  GENITOURINARY: No dysuria, hematuria.  ENDOCRINE: No polyuria, nocturia,  HEMATOLOGY: No anemia, easy bruising or bleeding SKIN: No rash or lesion. MUSCULOSKELETAL: No joint pain or arthritis.   NEUROLOGIC: No tingling, numbness, weakness.  PSYCHIATRY: No anxiety or depression.   DRUG ALLERGIES:   Allergies  Allergen Reactions  . Other Shortness Of Breath    caffergot  . Penicillins   . Seroquel [Quetiapine Fumarate] Other (See Comments)    hallucinations    VITALS:  Blood pressure (!) 149/67, pulse 96, temperature 97.7 F (36.5 C), temperature source Oral, resp. rate 20, height 5\' 7"  (1.702 m), weight 90.7 kg (200 lb), SpO2 98 %.  PHYSICAL EXAMINATION:  GENERAL:  66 y.o.-year-old patient lying in the bed with no acute distress.  EYES: Pupils equal, round, reactive to light and accommodation. No scleral icterus. Extraocular muscles intact.  HEENT: Head atraumatic, normocephalic. Oropharynx and nasopharynx clear.  NECK:  Supple, no jugular venous distention. No thyroid enlargement, no tenderness.  LUNGS: Normal breath sounds bilaterally, no wheezing, rales,rhonchi or crepitation. No use of accessory muscles of respiration.  CARDIOVASCULAR: S1, S2 normal. No murmurs, rubs, or gallops.  ABDOMEN: Soft, nontender, nondistended. Bowel sounds present. No organomegaly or mass.   EXTREMITIES: No pedal edema, cyanosis, or clubbing.  NEUROLOGIC: Cranial nerves II through XII are intact. Muscle strength 5/5 in all extremities. Sensation intact. Gait not checked.  PSYCHIATRIC: The patient is alert and oriented x 3.  SKIN: No obvious rash, lesion, or ulcer.    LABORATORY PANEL:   CBC  Recent Labs Lab 04/29/17 0740  WBC 12.2*  HGB 10.0*  HCT 29.3*  PLT 305   ------------------------------------------------------------------------------------------------------------------  Chemistries   Recent Labs Lab 04/28/17 1016 04/29/17 0740  NA 135 139  K 4.3 4.1  CL 105 106  CO2 22 25  GLUCOSE 223* 125*  BUN 41* 27*  CREATININE 1.36* 1.00  CALCIUM 7.9* 8.6*  AST 19  --   ALT 16  --   ALKPHOS 81  --   BILITOT 0.6  --    ------------------------------------------------------------------------------------------------------------------  Cardiac Enzymes  Recent Labs Lab 04/28/17 1016  TROPONINI <0.03   ------------------------------------------------------------------------------------------------------------------  RADIOLOGY:  Ct Abdomen Pelvis W Contrast  Result Date: 04/28/2017 CLINICAL DATA:  Left-sided abdominal pain, bright red stools. Possible GI bleed. EXAM: CT ABDOMEN AND PELVIS WITH CONTRAST TECHNIQUE: Multidetector CT imaging of the abdomen and pelvis was performed using the standard protocol following bolus administration of intravenous contrast. CONTRAST:  75mL ISOVUE-300 IOPAMIDOL (ISOVUE-300) INJECTION 61% COMPARISON:  04/29/2013. FINDINGS: Lower chest: Lung bases show no acute findings. Heart size normal. No pericardial or pleural effusion. Fluid-filled distal esophagus with a small hiatal hernia. Hepatobiliary: Liver is unremarkable. Cholecystectomy. No biliary ductal dilatation. Pancreas: Negative. Spleen: Negative. Adrenals/Urinary Tract: Adrenal glands and kidneys are unremarkable. Ureters are decompressed. Bladder is grossly unremarkable.  Stomach/Bowel: Postoperative changes of gastric bypass. There is dilatation of the distal  duodenum and proximal jejunum to the level of an anastomotic suture line within the left paramidline low abdomen (series 2, image 47). Findings may be similar to 04/29/2013. Small bowel and colon are otherwise unremarkable. Appendix not readily visualized. Vascular/Lymphatic: Mild atherosclerotic calcification of the arterial vasculature. Retroaortic left renal vein. No pathologically enlarged lymph nodes. Reproductive: Hysterectomy.  No adnexal mass. Other: No free fluid.  Mesenteries and peritoneum are unremarkable. Musculoskeletal: No worrisome lytic or sclerotic lesions. L5-S1 PLIF. IMPRESSION: 1. Dilatation of distal duodenal and proximal jejunal loops, to the level of a small bowel anastomosis in the left paramidline lower abdomen, somewhat similar to the prior exam, suggesting atony. Partial obstruction is not excluded. 2.  Aortic atherosclerosis (ICD10-170.0). Electronically Signed   By: Leanna BattlesMelinda  Blietz M.D.   On: 04/28/2017 12:20    EKG:   Orders placed or performed during the hospital encounter of 04/28/17  . ED EKG  . ED EKG  . EKG 12-Lead  . EKG 12-Lead    ASSESSMENT AND PLAN:    * GI bleed, likely lower could be diverticular versus hemorrhoidal bleed No other episodes of GI bleed noticed. Continue close monitoring of hemoglobin and hematocrit. On liquid diet and advance as tolerated. GI consult  * Symptomatic anemia secondary to GI bleed Hemoglobin 11.3-8.9 status post blood transfusion and hemoglobin today is at 10.0  IV fluid and Protonix IV.  *Hyperlipidemia continue Lipitor     * Hypothyroidism   Continue levothyroxine  * Chronic pain and depression    resume home medicationPercocet and Effexor  *Hypothyroidism continue Synthroid    All the records are reviewed and case discussed with Care Management/Social Workerr. Management plans discussed with the patient, family and  they are in agreement.  CODE STATUS: fc   TOTAL TIME TAKING CARE OF THIS PATIENT: 35  minutes.   POSSIBLE D/C IN 1-2 DAYS, DEPENDING ON CLINICAL CONDITION.  Note: This dictation was prepared with Dragon dictation along with smaller phrase technology. Any transcriptional errors that result from this process are unintentional.   Ramonita LabGouru, Mahlani Berninger M.D on 04/29/2017 at 3:33 PM  Between 7am to 6pm - Pager - [phone] After 6pm go to [email or web address] - password [email or web address] [facility]  [facility]  Office  [phone]  CC: Primary care physician; Clovis PuVan Horn, Arturo MortonJill E, DO

## 2017-04-29 NOTE — Consult Note (Signed)
Wyline Mood MD  [address] [place], [place] [address] Phone: [phone] Fax : [phone]  Consultation  Referring Provider:    Dr Amado Coe Primary Care Physician:  Clovis Pu, Arturo Morton, DO Primary Gastroenterologist:  None          Reason for Consultation:     GI bleed   Date of Admission:  04/28/2017 Date of Consultation:  04/29/2017         HPI:   COREY CAULFIELD is a 66 y.o. female was admitted yesterday with left lower abdominal pain , came into the ER and found to have a HB of 9 grams drop from a baseline of 11 grams. . She was hypotensive and tachycardic and hence admitted.   She says she was doing fine when all of a sudden had some lower abdominal sharp pain followed by 5-6 bright bloody bowel movements and then stopped. None since this morning. Recalls last colonoscopy 3-4 years back , had no polyps but had diverticuli. Denies any weight loss, use of NSAID's or blood thinners.   Past Medical History:  Diagnosis Date  . Back pain   . Depression   . Thyroid disease     Past Surgical History:  Procedure Laterality Date  . ABDOMINAL HYSTERECTOMY    . CHOLECYSTECTOMY    . gastric bipass    . HERNIA REPAIR      Prior to Admission medications   Medication Sig Start Date End Date Taking? Authorizing Provider  acetaminophen (TYLENOL) 500 MG tablet Take 500 mg by mouth every 6 (six) hours as needed.   Yes [provider]  aspirin EC 81 MG tablet Take 81 mg by mouth daily.   Yes [provider]  calcium-vitamin D (OSCAL WITH D) 500-200 MG-UNIT tablet Take 1 tablet by mouth.   Yes [provider]  cetirizine (ZYRTEC) 10 MG tablet Take 10 mg by mouth daily.   Yes [provider]  cyanocobalamin (,VITAMIN B-12,) 1000 MCG/ML injection Inject 1 mL into the skin every 30 (thirty) days. 04/16/17  Yes [provider]  diclofenac sodium (VOLTAREN) 1 % GEL Apply topically 4 (four) times daily. Prn arthritis pain   Yes [provider]  ferrous  gluconate (FERGON) 324 MG tablet Take 324 mg by mouth daily with breakfast.   Yes [provider]  fluticasone (FLONASE) 50 MCG/ACT nasal spray Place 1 spray into both nostrils daily as needed for allergies or rhinitis.   Yes [provider]  gabapentin (NEURONTIN) 100 MG capsule Take 100 mg by mouth 2 (two) times daily. 02/25/17  Yes [provider]  lactase (LACTAID) 3000 units tablet Take by mouth 3 (three) times daily with meals.   Yes [provider]  levothyroxine (SYNTHROID, LEVOTHROID) 25 MCG tablet Take 25 mcg by mouth daily. 04/26/17  Yes [provider]  lipase/protease/amylase (CREON) 12000 units CPEP capsule Take 24,000 Units by mouth 3 (three) times daily before meals.   Yes [provider]  loperamide (IMODIUM) 2 MG capsule Take by mouth as needed for diarrhea or loose stools.   Yes [provider]  LORazepam (ATIVAN) 1 MG tablet Take 0.5-1 mg by mouth 4 (four) times daily. 0.5 mg 0630,  1 mg 1100, 0.5 mg 1600, 1 mg 2030 04/05/17  Yes [provider]  magnesium oxide (MAG-OX) 400 MG tablet Take 400 mg by mouth daily.   Yes [provider]  oxybutynin (DITROPAN) 5 MG tablet Take 5 mg by mouth 2 (two) times  daily.   Yes [provider]  oxyCODONE-acetaminophen (PERCOCET) 5-325 MG tablet Take 1 tablet by mouth 4 (four) times daily. 0630,1100,1600,2030   Yes [provider]  pantoprazole (PROTONIX) 40 MG tablet Take 40 mg by mouth daily.   Yes [provider]  promethazine (PHENERGAN) 25 MG tablet Take 25 mg by mouth every 6 (six) hours as needed. 04/28/17  Yes [provider]  risperidone (RISPERDAL) 4 MG tablet Take 4 mg by mouth daily. 03/11/17  Yes [provider]  simethicone (MYLICON) 80 MG chewable tablet Chew 80 mg by mouth every 6 (six) hours as needed for flatulence.   Yes [provider]  traZODone (DESYREL) 100 MG tablet Take 100 mg by mouth at  bedtime. 04/14/17  Yes [provider]  venlafaxine XR (EFFEXOR-XR) 150 MG 24 hr capsule Take 150 mg by mouth daily. 02/25/17  Yes [provider]    Family History  Problem Relation Age of Onset  . Lung cancer Father      Social History  Substance Use Topics  . Smoking status: Never Smoker  . Smokeless tobacco: Never Used  . Alcohol use No    Allergies as of 04/28/2017 - Review Complete 04/28/2017  Allergen Reaction Noted  . Other Shortness Of Breath 04/28/2017  . Penicillins  04/28/2017  . Seroquel [quetiapine fumarate] Other (See Comments) 04/28/2017    Review of Systems:    All systems reviewed and negative except where noted in HPI.   Physical Exam:  Vital signs in last 24 hours: Temp:  [97.7 F (36.5 C)-99 F (37.2 C)] 97.7 F (36.5 C) (05/08 1406) Pulse Rate:  [96-103] 96 (05/08 1406) Resp:  [16-20] 20 (05/08 1406) BP: (120-149)/(53-70) 149/67 (05/08 1406) SpO2:  [96 %-98 %] 98 % (05/08 1406) Last BM Date: 04/29/17 General:   Pleasant, cooperative in NAD Head:  Normocephalic and atraumatic. Eyes:   No icterus.   Conjunctiva pink. PERRLA. Ears:  Normal auditory acuity. Neck:  Supple; no masses or thyroidomegaly Lungs: Respirations even and unlabored. Lungs clear to auscultation bilaterally.   No wheezes, crackles, or rhonchi.  Heart:  Regular rate and rhythm;  Without murmur, clicks, rubs or gallops Abdomen:  Soft, nondistended, nontender. Normal bowel sounds. No appreciable masses or hepatomegaly.  No rebound or guarding.  Extremities:  Without edema, cyanosis or clubbing. Neurologic:  Alert and oriented x3;  grossly normal neurologically. Skin:  Intact without significant lesions or rashes. Cervical Nodes:  No significant cervical adenopathy. Psych:  Alert and cooperative. Normal affect.  LAB RESULTS:  Recent Labs  04/28/17 1016 04/29/17 0740  WBC 14.8* 12.2*  HGB 8.9* 10.0*  HCT 27.0* 29.3*  PLT 348 305   BMET  Recent Labs   04/28/17 1016 04/29/17 0740  NA 135 139  K 4.3 4.1  CL 105 106  CO2 22 25  GLUCOSE 223* 125*  BUN 41* 27*  CREATININE 1.36* 1.00  CALCIUM 7.9* 8.6*   LFT  Recent Labs  04/28/17 1016  PROT 5.5*  ALBUMIN 2.9*  AST 19  ALT 16  ALKPHOS 81  BILITOT 0.6   PT/INR No results for input(s): LABPROT, INR in the last 72 hours.  STUDIES: Ct Abdomen Pelvis W Contrast  Result Date: 04/28/2017 CLINICAL DATA:  Left-sided abdominal pain, bright red stools. Possible GI bleed. EXAM: CT ABDOMEN AND PELVIS WITH CONTRAST TECHNIQUE: Multidetector CT imaging of the abdomen and pelvis was performed using the standard protocol following bolus administration of intravenous contrast. CONTRAST:  75mL  ISOVUE-300 IOPAMIDOL (ISOVUE-300) INJECTION 61% COMPARISON:  04/29/2013. FINDINGS: Lower chest: Lung bases show no acute findings. Heart size normal. No pericardial or pleural effusion. Fluid-filled distal esophagus with a small hiatal hernia. Hepatobiliary: Liver is unremarkable. Cholecystectomy. No biliary ductal dilatation. Pancreas: Negative. Spleen: Negative. Adrenals/Urinary Tract: Adrenal glands and kidneys are unremarkable. Ureters are decompressed. Bladder is grossly unremarkable. Stomach/Bowel: Postoperative changes of gastric bypass. There is dilatation of the distal duodenum and proximal jejunum to the level of an anastomotic suture line within the left paramidline low abdomen (series 2, image 47). Findings may be similar to 04/29/2013. Small bowel and colon are otherwise unremarkable. Appendix not readily visualized. Vascular/Lymphatic: Mild atherosclerotic calcification of the arterial vasculature. Retroaortic left renal vein. No pathologically enlarged lymph nodes. Reproductive: Hysterectomy.  No adnexal mass. Other: No free fluid.  Mesenteries and peritoneum are unremarkable. Musculoskeletal: No worrisome lytic or sclerotic lesions. L5-S1 PLIF. IMPRESSION: 1. Dilatation of distal duodenal and proximal  jejunal loops, to the level of a small bowel anastomosis in the left paramidline lower abdomen, somewhat similar to the prior exam, suggesting atony. Partial obstruction is not excluded. 2.  Aortic atherosclerosis (ICD10-170.0). Electronically Signed   By: Leanna Battles M.D.   On: 04/28/2017 12:20      Impression / Plan:   LAURIS KEEPERS is a 66 y.o. y/o female presented with sudden onset rectal bleeding which has stopped. Severe GI bleed associated with hypotension and diziness. Her history is suggestive of a diverticular bleed. Presently she has no abdominal pain .   Plan  1. Colonoscopy tomorrow morning  2. Bowel prep after 7 pm this evening ( she would like to have some broth prior to that )   I have discussed alternative options, risks & benefits,  which include, but are not limited to, bleeding, infection, perforation,respiratory complication & drug reaction.  The patient agrees with this plan & written consent will be obtained.     Thank you for involving me in the care of this patient.      LOS: 0 days   Wyline Mood, MD  04/29/2017, 4:13 PM

## 2017-04-30 ENCOUNTER — Observation Stay: Payer: Medicare PPO | Admitting: Anesthesiology

## 2017-04-30 ENCOUNTER — Encounter
Admission: EM | Disposition: A | Discharge status: Home / Self Care | Payer: Self-pay | Source: Home / Self Care | Attending: Emergency Medicine

## 2017-04-30 ENCOUNTER — Encounter: Payer: Self-pay | Admitting: Anesthesiology

## 2017-04-30 DIAGNOSIS — R531 Weakness: Secondary | ICD-10-CM | POA: Diagnosis not present

## 2017-04-30 DIAGNOSIS — K5731 Diverticulosis of large intestine without perforation or abscess with bleeding: Secondary | ICD-10-CM | POA: Diagnosis not present

## 2017-04-30 HISTORY — PX: COLONOSCOPY WITH PROPOFOL: SHX5780

## 2017-04-30 LAB — HIV ANTIBODY (ROUTINE TESTING W REFLEX): HIV Screen 4th Generation wRfx: NONREACTIVE

## 2017-04-30 LAB — CBC
HCT: 26.5 % — ABNORMAL LOW (ref 35.0–47.0)
Hemoglobin: 9.2 g/dL — ABNORMAL LOW (ref 12.0–16.0)
MCH: 31.6 pg (ref 26.0–34.0)
MCHC: 34.6 g/dL (ref 32.0–36.0)
MCV: 91.3 fL (ref 80.0–100.0)
Platelets: 299 10*3/uL (ref 150–440)
RBC: 2.9 MIL/uL — ABNORMAL LOW (ref 3.80–5.20)
RDW: 16.2 % — ABNORMAL HIGH (ref 11.5–14.5)
WBC: 12 10*3/uL — ABNORMAL HIGH (ref 3.6–11.0)

## 2017-04-30 LAB — BASIC METABOLIC PANEL
Anion gap: 6 (ref 5–15)
BUN: 13 mg/dL (ref 6–20)
CO2: 29 mmol/L (ref 22–32)
Calcium: 8.3 mg/dL — ABNORMAL LOW (ref 8.9–10.3)
Chloride: 106 mmol/L (ref 101–111)
Creatinine, Ser: 0.92 mg/dL (ref 0.44–1.00)
GFR calc Af Amer: >60 mL/min (ref >60)
GFR calc non Af Amer: >60 mL/min (ref >60)
Glucose, Bld: 118 mg/dL — ABNORMAL HIGH (ref 65–99)
Potassium: 4.1 mmol/L (ref 3.5–5.1)
Sodium: 141 mmol/L (ref 135–145)

## 2017-04-30 SURGERY — COLONOSCOPY WITH PROPOFOL
Anesthesia: General

## 2017-04-30 MED ORDER — PROPOFOL 10 MG/ML IV BOLUS
INTRAVENOUS | Status: DC | PRN
  Administered 2017-04-30: 50 mg via INTRAVENOUS

## 2017-04-30 MED ORDER — PROPOFOL 500 MG/50ML IV EMUL
INTRAVENOUS | Status: DC | PRN
  Administered 2017-04-30: 150 ug/kg/min via INTRAVENOUS

## 2017-04-30 MED ORDER — PROPOFOL 500 MG/50ML IV EMUL
INTRAVENOUS | Status: AC
  Filled 2017-04-30: qty 50

## 2017-04-30 MED ORDER — LIDOCAINE HCL (PF) 1 % IJ SOLN
INTRAMUSCULAR | Status: AC
  Administered 2017-04-30: 11:00:00
  Filled 2017-04-30: qty 2

## 2017-04-30 MED ORDER — PROPOFOL 10 MG/ML IV BOLUS
INTRAVENOUS | Status: AC
  Filled 2017-04-30: qty 20

## 2017-04-30 MED ORDER — SODIUM CHLORIDE 0.9 % IV SOLN: INTRAVENOUS | Status: DC

## 2017-04-30 NOTE — Progress Notes (Signed)
Pt d/ced home.  Colonoscopy revealed internal hemorrhoids and some diverticuli.  Patient has had no further bleeding.  Hgb was 10.1 and then 9.2.  Tolerated soft diet.  Continues to have chronic back pain.  Patient has extensive hx of GI problems and 2 gastric bypass surgeries.  Patient has been able to ambulate to the BR using her walker.  Will f/u outpt with GI.  IV removed; d/c instructions reviewed along w/f/u appts.  Patient's son in law [facility]

## 2017-04-30 NOTE — Op Note (Signed)
[facility] Gastroenterology Patient Name: Jacqueline Williams Procedure Date: 04/30/2017 9:50 AM MRN: [id number] Account #: [id number] Date of Birth: April 09, 1951 Admit Type: Inpatient Age: 66 Room: [facility] ENDO ROOM 1 Gender: Female Note Status: Finalized Procedure:            Colonoscopy Indications:          Rectal bleeding Providers:            Wyline Mood MD, MD Referring MD:         Vania Rea. Mayford Knife, MD (Referring MD) Medicines:            Monitored Anesthesia Care Complications:        No immediate complications. Procedure:            Pre-Anesthesia Assessment:                       - Prior to the procedure, a History and Physical was                        performed, and patient medications, allergies and                        sensitivities were reviewed. The patient's tolerance of                        previous anesthesia was reviewed.                       - The risks and benefits of the procedure and the                        sedation options and risks were discussed with the                        patient. All questions were answered and informed                        consent was obtained.                       - ASA Grade Assessment: II - A patient with mild                        systemic disease.                       After obtaining informed consent, the colonoscope was                        passed under direct vision. Throughout the procedure,                        the patient's blood pressure, pulse, and oxygen                        saturations were monitored continuously. The                        Colonoscope was introduced through the anus and                        advanced  to the the cecum, identified by the                        appendiceal orifice, IC valve and transillumination.                        The colonoscopy was performed with ease. The patient                        tolerated the procedure well. The quality of the bowel              preparation was good. Findings:      The perianal and digital rectal examinations were normal.      Non-bleeding internal hemorrhoids were found during retroflexion. The       hemorrhoids were small and Grade I (internal hemorrhoids that do not       prolapse).      A few small-mouthed diverticula were found in the sigmoid colon.      The exam was otherwise without abnormality on direct and retroflexion       views. Impression:           - Non-bleeding internal hemorrhoids.                       - Diverticulosis in the sigmoid colon.                       - The examination was otherwise normal on direct and                        retroflexion views.                       - No specimens collected. Recommendation:       - Return patient to hospital ward for possible                        discharge same day.                       - Advance diet as tolerated.                       - Continue present medications.                       - Can be discharged today from GI point of view and                        follow up with PCP office Procedure Code(s):    --- Professional ---                       [phone], Colonoscopy, flexible; diagnostic, including                        collection of specimen(s) by brushing or washing, when                        performed (separate procedure) Diagnosis Code(s):    --- Professional ---  K64.0, First degree hemorrhoids                       K62.5, Hemorrhage of anus and rectum                       K57.30, Diverticulosis of large intestine without                        perforation or abscess without bleeding CPT copyright 2016 American Medical Association. All rights reserved. The codes documented in this report are preliminary and upon coder review may  be revised to meet current compliance requirements. Wyline Mood, MD Wyline Mood MD, MD 04/30/2017 10:26:53 AM This report has been signed electronically. Number of  Addenda: 0 Note Initiated On: 04/30/2017 9:50 AM Scope Withdrawal Time: 0 hours 11 minutes 31 seconds  Total Procedure Duration: 0 hours 22 minutes 52 seconds       [facility]

## 2017-04-30 NOTE — Anesthesia Postprocedure Evaluation (Signed)
Anesthesia Post Note  Patient: Jacqueline MallickKay W Moede  Procedure(s) Performed: Procedure(s) (LRB): COLONOSCOPY WITH PROPOFOL (N/A)  Patient location during evaluation: Endoscopy Anesthesia Type: General Level of consciousness: awake and alert Pain management: pain level controlled Vital Signs Assessment: post-procedure vital signs reviewed and stable Respiratory status: spontaneous breathing, nonlabored ventilation, respiratory function stable and patient connected to nasal cannula oxygen Cardiovascular status: blood pressure returned to baseline and stable Postop Assessment: no signs of nausea or vomiting Anesthetic complications: no     Last Vitals:  Vitals:   04/30/17 1052 04/30/17 1134  BP: (!) 93/59 (!) 139/54  Pulse: 88 89  Resp: 20   Temp:  36.6 C    Last Pain:  Vitals:   04/30/17 1241  TempSrc:   PainSc: 4                  Lenard SimmerAndrew Kody Vigil

## 2017-04-30 NOTE — Anesthesia Post-op Follow-up Note (Cosign Needed)
Anesthesia QCDR form completed.        

## 2017-04-30 NOTE — Anesthesia Preprocedure Evaluation (Signed)
Anesthesia Evaluation  Patient identified by MRN, date of birth, ID band Patient awake    Reviewed: Allergy & Precautions, H&P , NPO status , Patient's Chart, lab work & pertinent test results, reviewed documented beta blocker date and time   History of Anesthesia Complications Negative for: history of anesthetic complications  Airway Mallampati: III  TM Distance: >3 FB Neck ROM: full    Dental  (+) Edentulous Upper, Edentulous Lower   Pulmonary neg pulmonary ROS,           Cardiovascular Exercise Tolerance: Good negative cardio ROS       Neuro/Psych PSYCHIATRIC DISORDERS (Depression) negative neurological ROS     GI/Hepatic Neg liver ROS, GERD  ,  Endo/Other  neg diabetesHypothyroidism   Renal/GU negative Renal ROS  negative genitourinary   Musculoskeletal   Abdominal   Peds  Hematology negative hematology ROS (+)   Anesthesia Other Findings Past Medical History: No date: Back pain No date: Depression No date: Thyroid disease   Reproductive/Obstetrics negative OB ROS                             Anesthesia Physical Anesthesia Plan  ASA: II  Anesthesia Plan: General   Post-op Pain Management:    Induction:   Airway Management Planned:   Additional Equipment:   Intra-op Plan:   Post-operative Plan:   Informed Consent: I have reviewed the patients History and Physical, chart, labs and discussed the procedure including the risks, benefits and alternatives for the proposed anesthesia with the patient or authorized representative who has indicated his/her understanding and acceptance.   Dental Advisory Given  Plan Discussed with: Anesthesiologist, CRNA and Surgeon  Anesthesia Plan Comments:         Anesthesia Quick Evaluation

## 2017-04-30 NOTE — Transfer of Care (Signed)
Immediate Anesthesia Transfer of Care Note  Patient: Jacqueline Williams  Procedure(s) Performed: Procedure(s): COLONOSCOPY WITH PROPOFOL (N/A)  Patient Location: PACU  Anesthesia Type:General  Level of Consciousness: awake and patient cooperative  Airway & Oxygen Therapy: Patient Spontanous Breathing and Patient connected to nasal cannula oxygen  Post-op Assessment: Report given to RN and Post -op Vital signs reviewed and stable  Post vital signs: Reviewed and stable  Last Vitals:  Vitals:   04/30/17 0910 04/30/17 1031  BP: 133/68 (!) 120/54  Pulse: 96 84  Resp: 18 (!) 21  Temp: 36.4 C     Last Pain:  Vitals:   04/30/17 1031  TempSrc: Tympanic  PainSc: 0-No pain      Patients Stated Pain Goal: 4 (04/30/17 0330)  Complications: No apparent anesthesia complications

## 2017-04-30 NOTE — H&P (Signed)
Jacqueline Mood MD [address], [address] [place], [place] [address] Phone: [phone] Fax : [phone]  Primary Care Physician:  Clovis Pu, Arturo Morton, DO Primary Gastroenterologist:  Dr. Wyline Williams   Pre-Procedure History & Physical: HPI:  Jacqueline Williams is a 66 y.o. female is here for an colonoscopy.   Past Medical History:  Diagnosis Date  . Back pain   . Depression   . Thyroid disease     Past Surgical History:  Procedure Laterality Date  . ABDOMINAL HYSTERECTOMY    . CHOLECYSTECTOMY    . gastric bipass    . HERNIA REPAIR      Prior to Admission medications   Medication Sig Start Date End Date Taking? Authorizing Provider  acetaminophen (TYLENOL) 500 MG tablet Take 500 mg by mouth every 6 (six) hours as needed.   Yes [provider]  aspirin EC 81 MG tablet Take 81 mg by mouth daily.   Yes [provider]  calcium-vitamin D (OSCAL WITH D) 500-200 MG-UNIT tablet Take 1 tablet by mouth.   Yes [provider]  cetirizine (ZYRTEC) 10 MG tablet Take 10 mg by mouth daily.   Yes [provider]  cyanocobalamin (,VITAMIN B-12,) 1000 MCG/ML injection Inject 1 mL into the skin every 30 (thirty) days. 04/16/17  Yes [provider]  diclofenac sodium (VOLTAREN) 1 % GEL Apply topically 4 (four) times daily. Prn arthritis pain   Yes [provider]  ferrous gluconate (FERGON) 324 MG tablet Take 324 mg by mouth daily with breakfast.   Yes [provider]  fluticasone (FLONASE) 50 MCG/ACT nasal spray Place 1 spray into both nostrils daily as needed for allergies or rhinitis.   Yes [provider]  gabapentin (NEURONTIN) 100 MG capsule Take 100 mg by mouth 2 (two) times daily. 02/25/17  Yes [provider]  lactase (LACTAID) 3000 units tablet Take by mouth 3 (three) times daily with meals.   Yes [provider]  levothyroxine (SYNTHROID, LEVOTHROID) 25 MCG tablet Take 25 mcg by mouth daily. 04/26/17  Yes  [provider]  lipase/protease/amylase (CREON) 12000 units CPEP capsule Take 24,000 Units by mouth 3 (three) times daily before meals.   Yes [provider]  loperamide (IMODIUM) 2 MG capsule Take by mouth as needed for diarrhea or loose stools.   Yes [provider]  LORazepam (ATIVAN) 1 MG tablet Take 0.5-1 mg by mouth 4 (four) times daily. 0.5 mg 0630,  1 mg 1100, 0.5 mg 1600, 1 mg 2030 04/05/17  Yes [provider]  magnesium oxide (MAG-OX) 400 MG tablet Take 400 mg by mouth daily.   Yes [provider]  oxybutynin (DITROPAN) 5 MG tablet Take 5 mg by mouth 2 (two) times daily.   Yes [provider]  oxyCODONE-acetaminophen (PERCOCET) 5-325 MG tablet Take 1 tablet by mouth 4 (four) times daily. 0630,1100,1600,2030   Yes [provider]  pantoprazole (PROTONIX) 40 MG tablet Take 40 mg by mouth daily.   Yes [provider]  promethazine (PHENERGAN) 25 MG tablet Take 25 mg by mouth every 6 (six) hours as needed. 04/28/17  Yes [provider]  risperidone (RISPERDAL) 4 MG tablet Take 4 mg by mouth daily. 03/11/17  Yes [provider]  simethicone (MYLICON) 80 MG chewable tablet Chew 80 mg by mouth every 6 (six) hours as needed for flatulence.   Yes [provider]  traZODone (DESYREL) 100 MG tablet Take 100 mg by mouth at bedtime. 04/14/17  Yes [provider]  venlafaxine XR (EFFEXOR-XR) 150 MG 24 hr capsule Take 150 mg by mouth daily. 02/25/17  Yes [provider]    Allergies as of 04/28/2017 - Review Complete 04/28/2017  Allergen Reaction Noted  . Other Shortness Of Breath 04/28/2017  . Penicillins  04/28/2017  . Seroquel [quetiapine fumarate] Other (See Comments) 04/28/2017    Family History  Problem Relation Age of Onset  . Lung cancer Father     Social History   Social History  . Marital status: Widowed    Spouse name: N/A  . Number of children: N/A  . Years of  education: N/A   Occupational History  . Not on file.   Social History Main Topics  . Smoking status: Never Smoker  . Smokeless tobacco: Never Used  . Alcohol use No  . Drug use: No  . Sexual activity: Not on file   Other Topics Concern  . Not on file   Social History Narrative  . No narrative on file    Review of Systems: See HPI, otherwise negative ROS  Physical Exam: BP 133/68   Pulse 96   Temp 97.6 F (36.4 C) (Tympanic)   Resp 18   Ht 5\' 7"  (1.702 m)   Wt 200 lb (90.7 kg)   SpO2 95%   BMI 31.32 kg/m  General:   Alert,  pleasant and cooperative in NAD Head:  Normocephalic and atraumatic. Neck:  Supple; no masses or thyromegaly. Lungs:  Clear throughout to auscultation.    Heart:  Regular rate and rhythm. Abdomen:  Soft, nontender and nondistended. Normal bowel sounds, without guarding, and without rebound.   Neurologic:  Alert and  oriented x4;  grossly normal neurologically.  Impression/Plan: Jacqueline Williams is here for an colonoscopy to be performed for rectal bleeding   Risks, benefits, limitations, and alternatives regarding  colonoscopy have been reviewed with the patient.  Questions have been answered.  All parties agreeable.   Jacqueline MoodKiran Karmello Abercrombie, MD  04/30/2017, 9:19 AM

## 2017-04-30 NOTE — Discharge Instructions (Signed)
F/U WITH pcp in a week f/u with GI in 2 weeks

## 2017-04-30 NOTE — Discharge Summary (Signed)
[facility] -  at [facility]   PATIENT NAME: Jacqueline Williams    MR#:  [id number]  DATE OF BIRTH:  [birth year]  DATE OF ADMISSION:  04/28/2017 ADMITTING PHYSICIAN: Altamese Dilling, MD  DATE OF DISCHARGE: 04/30/17  PRIMARY CARE PHYSICIAN: Clovis Pu, Arturo Morton, DO    ADMISSION DIAGNOSIS:  Gastrointestinal hemorrhage, unspecified gastrointestinal hemorrhage type [K92.2] Anemia, unspecified type [D64.9]  DISCHARGE DIAGNOSIS:  Principal Problem:   GI bleed diverticulosis  SECONDARY DIAGNOSIS:   Past Medical History:  Diagnosis Date  . Back pain   . Depression   . Thyroid disease     HOSPITAL COURSE:   HISTORY OF PRESENT ILLNESS: Jacqueline Williams  is a 66 y.o. female with a known history of Back pain, depression, hypothyroidism- does not take over-the-counter pain medications, today morning around 4:00 woke up with pain in left lower abdominal quadrant and started having loose bowel movement with blood she had 6 episodes at home and started feeling dizzy concerned with this she was brought to emergency room and noted to have a hemoglobin less than 9 where in remote past it was around 11 patient was also noted to be tachycardic and slightly hypotensive so ER physician ordered blood transfusion for symptomatic anemia and given to hospitalist team for further management.  * GI bleed, likely lower could be diverticular versus hemorrhoidal bleed No other episodes of GI bleed noticed. Colonoscopy 04/30/17 revealed diverticulosis and internal hemorrhods  advance as tolerated. GI is ok to d/c patient .   * Symptomatic anemia secondary to GI bleed Hemoglobin 11.3-8.9 status post blood transfusion and hemoglobin today is at 10.0- 9.2   IV fluid and Protonix IV are given. D/c with PPI  *Hyperlipidemia continue Lipitor   * Hypothyroidism Continue levothyroxine  * Chronic pain and depression  resume home medicationPercocet and Effexor  *Hypothyroidism  continue Synthroid    DISCHARGE CONDITIONS:   Stable   CONSULTS OBTAINED:     PROCEDURES  Colonoscopy   DRUG ALLERGIES:   Allergies  Allergen Reactions  . Other Shortness Of Breath    caffergot  . Penicillins   . Seroquel [Quetiapine Fumarate] Other (See Comments)    hallucinations    DISCHARGE MEDICATIONS:   Current Discharge Medication List    CONTINUE these medications which have NOT CHANGED   Details  acetaminophen (TYLENOL) 500 MG tablet Take 500 mg by mouth every 6 (six) hours as needed.    aspirin EC 81 MG tablet Take 81 mg by mouth daily.    calcium-vitamin D (OSCAL WITH D) 500-200 MG-UNIT tablet Take 1 tablet by mouth.    cetirizine (ZYRTEC) 10 MG tablet Take 10 mg by mouth daily.    cyanocobalamin (,VITAMIN B-12,) 1000 MCG/ML injection Inject 1 mL into the skin every 30 (thirty) days.    diclofenac sodium (VOLTAREN) 1 % GEL Apply topically 4 (four) times daily. Prn arthritis pain    ferrous gluconate (FERGON) 324 MG tablet Take 324 mg by mouth daily with breakfast.    fluticasone (FLONASE) 50 MCG/ACT nasal spray Place 1 spray into both nostrils daily as needed for allergies or rhinitis.    gabapentin (NEURONTIN) 100 MG capsule Take 100 mg by mouth 2 (two) times daily.    lactase (LACTAID) 3000 units tablet Take by mouth 3 (three) times daily with meals.    levothyroxine (SYNTHROID, LEVOTHROID) 25 MCG tablet Take 25 mcg by mouth daily.    lipase/protease/amylase (CREON) 12000 units CPEP capsule Take 24,000 Units by mouth  3 (three) times daily before meals.    loperamide (IMODIUM) 2 MG capsule Take by mouth as needed for diarrhea or loose stools.    LORazepam (ATIVAN) 1 MG tablet Take 0.5-1 mg by mouth 4 (four) times daily. 0.5 mg 0630,  1 mg 1100, 0.5 mg 1600, 1 mg 2030    magnesium oxide (MAG-OX) 400 MG tablet Take 400 mg by mouth daily.    oxybutynin (DITROPAN) 5 MG tablet Take 5 mg by mouth 2 (two) times daily.    oxyCODONE-acetaminophen  (PERCOCET) 5-325 MG tablet Take 1 tablet by mouth 4 (four) times daily. 0630,1100,1600,2030    pantoprazole (PROTONIX) 40 MG tablet Take 40 mg by mouth daily.    promethazine (PHENERGAN) 25 MG tablet Take 25 mg by mouth every 6 (six) hours as needed.    risperidone (RISPERDAL) 4 MG tablet Take 4 mg by mouth daily.    simethicone (MYLICON) 80 MG chewable tablet Chew 80 mg by mouth every 6 (six) hours as needed for flatulence.    traZODone (DESYREL) 100 MG tablet Take 100 mg by mouth at bedtime.    venlafaxine XR (EFFEXOR-XR) 150 MG 24 hr capsule Take 150 mg by mouth daily.         DISCHARGE INSTRUCTIONS:   F/U WITH pcp in a week f/u with GI in 2 weeks   DIET:  Low fat, Low cholesterol diet  DISCHARGE CONDITION:  Fair  ACTIVITY:  Activity as tolerated  OXYGEN:  Home Oxygen: No.   Oxygen Delivery: room air  DISCHARGE LOCATION:  home   If you experience worsening of your admission symptoms, develop shortness of breath, life threatening emergency, suicidal or homicidal thoughts you must seek medical attention immediately by calling 911 or calling your MD immediately  if symptoms less severe.  You Must read complete instructions/literature along with all the possible adverse reactions/side effects for all the Medicines you take and that have been prescribed to you. Take any new Medicines after you have completely understood and accpet all the possible adverse reactions/side effects.   Please note  You were cared for by a hospitalist during your hospital stay. If you have any questions about your discharge medications or the care you received while you were in the hospital after you are discharged, you can call the unit and asked to speak with the hospitalist on call if the hospitalist that took care of you is not available. Once you are discharged, your primary care physician will handle any further medical issues. Please note that NO REFILLS for any discharge medications  will be authorized once you are discharged, as it is imperative that you return to your primary care physician (or establish a relationship with a primary care physician if you do not have one) for your aftercare needs so that they can reassess your need for medications and monitor your lab values.     Today  Chief Complaint  Patient presents with  . GI Bleeding   Pt is feeling fine, had colonoscopy today, Diverticulosis and internal hemorrhoids  ROS:  CONSTITUTIONAL: Denies fevers, chills. Denies any fatigue, weakness.  EYES: Denies blurry vision, double vision, eye pain. EARS, NOSE, THROAT: Denies tinnitus, ear pain, hearing loss. RESPIRATORY: Denies cough, wheeze, shortness of breath.  CARDIOVASCULAR: Denies chest pain, palpitations, edema.  GASTROINTESTINAL: Denies nausea, vomiting, diarrhea, abdominal pain. Denies bright red blood per rectum. GENITOURINARY: Denies dysuria, hematuria. ENDOCRINE: Denies nocturia or thyroid problems. HEMATOLOGIC AND LYMPHATIC: Denies easy bruising or bleeding. SKIN: Denies rash or  lesion. MUSCULOSKELETAL: Denies pain in neck, back, shoulder, knees, hips or arthritic symptoms.  NEUROLOGIC: Denies paralysis, paresthesias.  PSYCHIATRIC: Denies anxiety or depressive symptoms.   VITAL SIGNS:  Blood pressure (!) 139/54, pulse 89, temperature 97.9 F (36.6 C), temperature source Oral, resp. rate 20, height 5\' 7"  (1.702 m), weight 90.7 kg (200 lb), SpO2 99 %.  I/O:    Intake/Output Summary (Last 24 hours) at 04/30/17 1344 Last data filed at 04/30/17 1025  Gross per 24 hour  Intake             1110 ml  Output                0 ml  Net             1110 ml    PHYSICAL EXAMINATION:  GENERAL:  66 y.o.-year-old patient lying in the bed with no acute distress.  EYES: Pupils equal, round, reactive to light and accommodation. No scleral icterus. Extraocular muscles intact.  HEENT: Head atraumatic, normocephalic. Oropharynx and nasopharynx clear.  NECK:   Supple, no jugular venous distention. No thyroid enlargement, no tenderness.  LUNGS: Normal breath sounds bilaterally, no wheezing, rales,rhonchi or crepitation. No use of accessory muscles of respiration.  CARDIOVASCULAR: S1, S2 normal. No murmurs, rubs, or gallops.  ABDOMEN: Soft, non-tender, non-distended. Bowel sounds present. No organomegaly or mass.  EXTREMITIES: No pedal edema, cyanosis, or clubbing.  NEUROLOGIC: Cranial nerves II through XII are intact. Muscle strength 5/5 in all extremities. Sensation intact. Gait not checked.  PSYCHIATRIC: The patient is alert and oriented x 3.  SKIN: No obvious rash, lesion, or ulcer.   DATA REVIEW:   CBC  Recent Labs Lab 04/30/17 0639  WBC 12.0*  HGB 9.2*  HCT 26.5*  PLT 299    Chemistries   Recent Labs Lab 04/28/17 1016  04/30/17 0639  NA 135  < > 141  K 4.3  < > 4.1  CL 105  < > 106  CO2 22  < > 29  GLUCOSE 223*  < > 118*  BUN 41*  < > 13  CREATININE 1.36*  < > 0.92  CALCIUM 7.9*  < > 8.3*  AST 19  --   --   ALT 16  --   --   ALKPHOS 81  --   --   BILITOT 0.6  --   --   < > = values in this interval not displayed.  Cardiac Enzymes  Recent Labs Lab 04/28/17 1016  TROPONINI <0.03    Microbiology Results  Results for orders placed or performed during the hospital encounter of 04/28/17  MRSA PCR Screening     Status: None   Collection Time: 04/28/17  5:01 PM  Result Value Ref Range Status   MRSA by PCR NEGATIVE NEGATIVE Final    Comment:        The GeneXpert MRSA Assay (FDA approved for NASAL specimens only), is one component of a comprehensive MRSA colonization surveillance program. It is not intended to diagnose MRSA infection nor to guide or monitor treatment for MRSA infections.     RADIOLOGY:  Ct Abdomen Pelvis W Contrast  Result Date: 04/28/2017 CLINICAL DATA:  Left-sided abdominal pain, bright red stools. Possible GI bleed. EXAM: CT ABDOMEN AND PELVIS WITH CONTRAST TECHNIQUE: Multidetector CT  imaging of the abdomen and pelvis was performed using the standard protocol following bolus administration of intravenous contrast. CONTRAST:  75mL ISOVUE-300 IOPAMIDOL (ISOVUE-300) INJECTION 61% COMPARISON:  04/29/2013. FINDINGS: Lower chest:  Lung bases show no acute findings. Heart size normal. No pericardial or pleural effusion. Fluid-filled distal esophagus with a small hiatal hernia. Hepatobiliary: Liver is unremarkable. Cholecystectomy. No biliary ductal dilatation. Pancreas: Negative. Spleen: Negative. Adrenals/Urinary Tract: Adrenal glands and kidneys are unremarkable. Ureters are decompressed. Bladder is grossly unremarkable. Stomach/Bowel: Postoperative changes of gastric bypass. There is dilatation of the distal duodenum and proximal jejunum to the level of an anastomotic suture line within the left paramidline low abdomen (series 2, image 47). Findings may be similar to 04/29/2013. Small bowel and colon are otherwise unremarkable. Appendix not readily visualized. Vascular/Lymphatic: Mild atherosclerotic calcification of the arterial vasculature. Retroaortic left renal vein. No pathologically enlarged lymph nodes. Reproductive: Hysterectomy.  No adnexal mass. Other: No free fluid.  Mesenteries and peritoneum are unremarkable. Musculoskeletal: No worrisome lytic or sclerotic lesions. L5-S1 PLIF. IMPRESSION: 1. Dilatation of distal duodenal and proximal jejunal loops, to the level of a small bowel anastomosis in the left paramidline lower abdomen, somewhat similar to the prior exam, suggesting atony. Partial obstruction is not excluded. 2.  Aortic atherosclerosis (ICD10-170.0). Electronically Signed   By: Leanna Battles M.D.   On: 04/28/2017 12:20    EKG:   Orders placed or performed during the hospital encounter of 04/28/17  . ED EKG  . ED EKG  . EKG 12-Lead  . EKG 12-Lead      Management plans discussed with the patient, family and they are in agreement.  CODE STATUS:     Code Status  Orders        Start     Ordered   04/28/17 1503  Full code  Continuous     04/28/17 1502    Code Status History    Date Active Date Inactive Code Status Order ID Comments User Context   This patient has a current code status but no historical code status.    Advance Directive Documentation     Most Recent Value  Type of Advance Directive  Living will, Healthcare Power of Attorney  Pre-existing out of facility DNR order (yellow form or pink MOST form)  -  "MOST" Form in Place?  -      TOTAL TIME TAKING CARE OF THIS PATIENT: 43  minutes.   Note: This dictation was prepared with Dragon dictation along with smaller phrase technology. Any transcriptional errors that result from this process are unintentional.   @M[facility] @  on 04/30/2017 at 1:44 PM  Between 7am to 6pm - Pager - [phone]  After 6pm go to [email or web address] - password [email or web address] [facility]  [facility]  Office  [phone]  CC: Primary care physician; Clovis Pu, Arturo Morton, DO

## 2017-04-30 NOTE — Anesthesia Procedure Notes (Signed)
Date/Time: 04/30/2017 10:00 AM Performed by: Marlana SalvageJESSUP, Eveline Sauve Pre-anesthesia Checklist: Patient identified, Emergency Drugs available, Suction available, Patient being monitored and Timeout performed Patient Re-evaluated:Patient Re-evaluated prior to inductionOxygen Delivery Method: Nasal cannula Intubation Type: IV induction Placement Confirmation: positive ETCO2

## 2017-05-01 LAB — TYPE AND SCREEN
ABO/RH(D): O POS
Antibody Screen: POSITIVE
Unit division: 0
Unit division: 0
Unit division: 0
Unit division: 0

## 2017-05-01 LAB — BPAM RBC
Blood Product Expiration Date: 201805152359
Blood Product Expiration Date: 201805152359
Blood Product Expiration Date: 201805152359
Blood Product Expiration Date: 201805302359
ISSUE DATE / TIME: 201805071643
ISSUE DATE / TIME: 201805072233
Unit Type and Rh: 5100
Unit Type and Rh: 5100
Unit Type and Rh: 5100
Unit Type and Rh: 5100

## 2017-05-02 ENCOUNTER — Encounter: Payer: Self-pay | Admitting: Gastroenterology

## 2017-05-03 ENCOUNTER — Encounter: Payer: Self-pay | Admitting: Emergency Medicine

## 2017-05-03 ENCOUNTER — Inpatient Hospital Stay: Payer: Medicare PPO | Admitting: Anesthesiology

## 2017-05-03 ENCOUNTER — Other Ambulatory Visit: Payer: Self-pay | Admitting: Gastroenterology

## 2017-05-03 ENCOUNTER — Encounter
Admission: EM | Disposition: A | Discharge status: Home Health | Payer: Self-pay | Source: Home / Self Care | Attending: Internal Medicine

## 2017-05-03 ENCOUNTER — Inpatient Hospital Stay
Admission: EM | Admit: 2017-05-03 | Discharge: 2017-05-12 | DRG: 378 | Disposition: A | Discharge status: Home Health | Payer: Medicare PPO | Attending: Internal Medicine | Admitting: Internal Medicine

## 2017-05-03 ENCOUNTER — Emergency Department: Payer: Medicare PPO

## 2017-05-03 DIAGNOSIS — D62 Acute posthemorrhagic anemia: Secondary | ICD-10-CM

## 2017-05-03 DIAGNOSIS — E860 Dehydration: Secondary | ICD-10-CM

## 2017-05-03 DIAGNOSIS — K5731 Diverticulosis of large intestine without perforation or abscess with bleeding: Secondary | ICD-10-CM | POA: Diagnosis present

## 2017-05-03 DIAGNOSIS — D500 Iron deficiency anemia secondary to blood loss (chronic): Secondary | ICD-10-CM

## 2017-05-03 DIAGNOSIS — Z7982 Long term (current) use of aspirin: Secondary | ICD-10-CM | POA: Diagnosis not present

## 2017-05-03 DIAGNOSIS — Z6841 Body Mass Index (BMI) 40.0 and over, adult: Secondary | ICD-10-CM

## 2017-05-03 DIAGNOSIS — K921 Melena: Secondary | ICD-10-CM | POA: Diagnosis not present

## 2017-05-03 DIAGNOSIS — E039 Hypothyroidism, unspecified: Secondary | ICD-10-CM | POA: Diagnosis present

## 2017-05-03 DIAGNOSIS — Z9289 Personal history of other medical treatment: Secondary | ICD-10-CM

## 2017-05-03 DIAGNOSIS — K922 Gastrointestinal hemorrhage, unspecified: Secondary | ICD-10-CM | POA: Diagnosis not present

## 2017-05-03 DIAGNOSIS — R197 Diarrhea, unspecified: Secondary | ICD-10-CM

## 2017-05-03 DIAGNOSIS — R079 Chest pain, unspecified: Secondary | ICD-10-CM

## 2017-05-03 DIAGNOSIS — Z90710 Acquired absence of both cervix and uterus: Secondary | ICD-10-CM | POA: Diagnosis not present

## 2017-05-03 DIAGNOSIS — N179 Acute kidney failure, unspecified: Secondary | ICD-10-CM | POA: Diagnosis present

## 2017-05-03 DIAGNOSIS — M549 Dorsalgia, unspecified: Secondary | ICD-10-CM | POA: Diagnosis present

## 2017-05-03 DIAGNOSIS — N289 Disorder of kidney and ureter, unspecified: Secondary | ICD-10-CM | POA: Diagnosis present

## 2017-05-03 DIAGNOSIS — Z9884 Bariatric surgery status: Secondary | ICD-10-CM

## 2017-05-03 DIAGNOSIS — E871 Hypo-osmolality and hyponatremia: Secondary | ICD-10-CM

## 2017-05-03 DIAGNOSIS — D649 Anemia, unspecified: Secondary | ICD-10-CM

## 2017-05-03 DIAGNOSIS — T81509A Unspecified complication of foreign body accidentally left in body following unspecified procedure, initial encounter: Secondary | ICD-10-CM

## 2017-05-03 DIAGNOSIS — D72829 Elevated white blood cell count, unspecified: Secondary | ICD-10-CM

## 2017-05-03 DIAGNOSIS — R531 Weakness: Secondary | ICD-10-CM | POA: Diagnosis present

## 2017-05-03 DIAGNOSIS — G8929 Other chronic pain: Secondary | ICD-10-CM | POA: Diagnosis present

## 2017-05-03 HISTORY — PX: ESOPHAGOGASTRODUODENOSCOPY (EGD) WITH PROPOFOL: SHX5813

## 2017-05-03 LAB — COMPREHENSIVE METABOLIC PANEL
ALT: 15 U/L (ref 14–54)
AST: 23 U/L (ref 15–41)
Albumin: 2.6 g/dL — ABNORMAL LOW (ref 3.5–5.0)
Alkaline Phosphatase: 74 U/L (ref 38–126)
Anion gap: 8 (ref 5–15)
BUN: 43 mg/dL — ABNORMAL HIGH (ref 6–20)
CO2: 26 mmol/L (ref 22–32)
Calcium: 8.4 mg/dL — ABNORMAL LOW (ref 8.9–10.3)
Chloride: 100 mmol/L — ABNORMAL LOW (ref 101–111)
Creatinine, Ser: 1.29 mg/dL — ABNORMAL HIGH (ref 0.44–1.00)
GFR calc Af Amer: 50 mL/min — ABNORMAL LOW (ref >60)
GFR calc non Af Amer: 43 mL/min — ABNORMAL LOW (ref >60)
Glucose, Bld: 177 mg/dL — ABNORMAL HIGH (ref 65–99)
Potassium: 4 mmol/L (ref 3.5–5.1)
Sodium: 134 mmol/L — ABNORMAL LOW (ref 135–145)
Total Bilirubin: 0.5 mg/dL (ref 0.3–1.2)
Total Protein: 5 g/dL — ABNORMAL LOW (ref 6.5–8.1)

## 2017-05-03 LAB — CBC
HCT: 15.6 % — ABNORMAL LOW (ref 35.0–47.0)
Hemoglobin: 5.3 g/dL — ABNORMAL LOW (ref 12.0–16.0)
MCH: 32.1 pg (ref 26.0–34.0)
MCHC: 34.2 g/dL (ref 32.0–36.0)
MCV: 93.8 fL (ref 80.0–100.0)
Platelets: 290 10*3/uL (ref 150–440)
RBC: 1.66 MIL/uL — ABNORMAL LOW (ref 3.80–5.20)
RDW: 15.5 % — ABNORMAL HIGH (ref 11.5–14.5)
WBC: 18.7 10*3/uL — ABNORMAL HIGH (ref 3.6–11.0)

## 2017-05-03 LAB — HEMOGLOBIN AND HEMATOCRIT, BLOOD
HCT: 14.5 % — CL (ref 35.0–47.0)
HCT: 19.9 % — ABNORMAL LOW (ref 35.0–47.0)
HCT: 21.9 % — ABNORMAL LOW (ref 35.0–47.0)
Hemoglobin: 5 g/dL — ABNORMAL LOW (ref 12.0–16.0)
Hemoglobin: 6.6 g/dL — ABNORMAL LOW (ref 12.0–16.0)
Hemoglobin: 7.2 g/dL — ABNORMAL LOW (ref 12.0–16.0)

## 2017-05-03 LAB — BASIC METABOLIC PANEL
Anion gap: 9 (ref 5–15)
BUN: 37 mg/dL — ABNORMAL HIGH (ref 6–20)
CO2: 24 mmol/L (ref 22–32)
Calcium: 8.5 mg/dL — ABNORMAL LOW (ref 8.9–10.3)
Chloride: 102 mmol/L (ref 101–111)
Creatinine, Ser: 1.14 mg/dL — ABNORMAL HIGH (ref 0.44–1.00)
GFR calc Af Amer: 58 mL/min — ABNORMAL LOW (ref >60)
GFR calc non Af Amer: 50 mL/min — ABNORMAL LOW (ref >60)
Glucose, Bld: 130 mg/dL — ABNORMAL HIGH (ref 65–99)
Potassium: 4.3 mmol/L (ref 3.5–5.1)
Sodium: 135 mmol/L (ref 135–145)

## 2017-05-03 LAB — PREPARE RBC (CROSSMATCH)

## 2017-05-03 LAB — TROPONIN I: Troponin I: <0.03 ng/mL (ref <0.03)

## 2017-05-03 SURGERY — ESOPHAGOGASTRODUODENOSCOPY (EGD) WITH PROPOFOL
Anesthesia: General

## 2017-05-03 SURGERY — EGD (ESOPHAGOGASTRODUODENOSCOPY)
Anesthesia: Moderate Sedation | Laterality: Left

## 2017-05-03 MED ORDER — MAGNESIUM OXIDE 400 (241.3 MG) MG PO TABS
400.0000 mg | ORAL_TABLET | Freq: Every day | ORAL | Status: DC
  Administered 2017-05-03 – 2017-05-12 (×10): 400 mg via ORAL
  Filled 2017-05-03 (×10): qty 1

## 2017-05-03 MED ORDER — SODIUM CHLORIDE 0.9 % IV SOLN
Freq: Once | INTRAVENOUS | Status: AC
  Administered 2017-05-09: 08:00:00 via INTRAVENOUS

## 2017-05-03 MED ORDER — LIDOCAINE HCL (CARDIAC) 20 MG/ML IV SOLN
INTRAVENOUS | Status: DC | PRN
  Administered 2017-05-03: 100 mg via INTRATRACHEAL

## 2017-05-03 MED ORDER — ONDANSETRON HCL 4 MG/2ML IJ SOLN
4.0000 mg | Freq: Four times a day (QID) | INTRAMUSCULAR | Status: DC | PRN
  Administered 2017-05-03 – 2017-05-11 (×4): 4 mg via INTRAVENOUS
  Filled 2017-05-03 (×3): qty 2

## 2017-05-03 MED ORDER — RISPERIDONE 0.5 MG PO TABS: 4.0000 mg | ORAL_TABLET | Freq: Every day | ORAL | Status: DC

## 2017-05-03 MED ORDER — VENLAFAXINE HCL ER 75 MG PO CP24
150.0000 mg | ORAL_CAPSULE | Freq: Every day | ORAL | Status: DC
  Administered 2017-05-03 – 2017-05-11 (×9): 150 mg via ORAL
  Filled 2017-05-03 (×9): qty 2

## 2017-05-03 MED ORDER — FLUTICASONE PROPIONATE 50 MCG/ACT NA SUSP
1.0000 | Freq: Every day | NASAL | Status: DC | PRN
  Filled 2017-05-03: qty 16

## 2017-05-03 MED ORDER — ONDANSETRON HCL 4 MG PO TABS
4.0000 mg | ORAL_TABLET | Freq: Four times a day (QID) | ORAL | Status: DC | PRN
  Administered 2017-05-11: 4 mg via ORAL
  Filled 2017-05-03: qty 1

## 2017-05-03 MED ORDER — OXYBUTYNIN CHLORIDE 5 MG PO TABS
5.0000 mg | ORAL_TABLET | Freq: Two times a day (BID) | ORAL | Status: DC
  Administered 2017-05-03 – 2017-05-12 (×19): 5 mg via ORAL
  Filled 2017-05-03 (×19): qty 1

## 2017-05-03 MED ORDER — ONDANSETRON HCL 4 MG/2ML IJ SOLN
INTRAMUSCULAR | Status: AC
  Filled 2017-05-03: qty 2

## 2017-05-03 MED ORDER — LIDOCAINE HCL 2 % IJ SOLN
INTRAMUSCULAR | Status: AC
  Filled 2017-05-03: qty 10

## 2017-05-03 MED ORDER — VENLAFAXINE HCL ER 75 MG PO CP24: 150.0000 mg | ORAL_CAPSULE | Freq: Every day | ORAL | Status: DC

## 2017-05-03 MED ORDER — ACETAMINOPHEN 650 MG RE SUPP: 650.0000 mg | Freq: Four times a day (QID) | RECTAL | Status: DC | PRN

## 2017-05-03 MED ORDER — SODIUM CHLORIDE 0.9 % IV SOLN
8.0000 mg/h | INTRAVENOUS | Status: AC
  Administered 2017-05-03 – 2017-05-05 (×7): 8 mg/h via INTRAVENOUS
  Filled 2017-05-03 (×7): qty 80

## 2017-05-03 MED ORDER — ACETAMINOPHEN 325 MG PO TABS
650.0000 mg | ORAL_TABLET | Freq: Four times a day (QID) | ORAL | Status: DC | PRN
  Administered 2017-05-11: 650 mg via ORAL
  Filled 2017-05-03: qty 2

## 2017-05-03 MED ORDER — PROPOFOL 10 MG/ML IV BOLUS
INTRAVENOUS | Status: DC | PRN
  Administered 2017-05-03: 70 mg via INTRAVENOUS
  Administered 2017-05-03 (×2): 50 mg via INTRAVENOUS

## 2017-05-03 MED ORDER — SODIUM CHLORIDE 0.9 % IV BOLUS (SEPSIS)
1000.0000 mL | Freq: Once | INTRAVENOUS | Status: AC
  Administered 2017-05-03: 1000 mL via INTRAVENOUS

## 2017-05-03 MED ORDER — MORPHINE SULFATE (PF) 2 MG/ML IV SOLN
INTRAVENOUS | Status: AC
  Filled 2017-05-03: qty 1

## 2017-05-03 MED ORDER — FERROUS GLUCONATE 324 (38 FE) MG PO TABS
324.0000 mg | ORAL_TABLET | Freq: Every day | ORAL | Status: DC
  Administered 2017-05-03 – 2017-05-12 (×10): 324 mg via ORAL
  Filled 2017-05-03 (×13): qty 1

## 2017-05-03 MED ORDER — PHENYLEPHRINE HCL 10 MG/ML IJ SOLN
INTRAMUSCULAR | Status: DC | PRN
  Administered 2017-05-03: 200 ug via INTRAVENOUS

## 2017-05-03 MED ORDER — SODIUM CHLORIDE 0.9 % IV SOLN
Freq: Once | INTRAVENOUS | Status: AC
  Administered 2017-05-03 (×2): via INTRAVENOUS

## 2017-05-03 MED ORDER — RISPERIDONE 2 MG PO TABS
4.0000 mg | ORAL_TABLET | Freq: Every day | ORAL | Status: DC
  Filled 2017-05-03: qty 2

## 2017-05-03 MED ORDER — RISPERIDONE 1 MG PO TABS
4.0000 mg | ORAL_TABLET | Freq: Every day | ORAL | Status: DC
  Administered 2017-05-03 – 2017-05-11 (×9): 4 mg via ORAL
  Filled 2017-05-03: qty 8
  Filled 2017-05-03 (×9): qty 4

## 2017-05-03 MED ORDER — RISPERIDONE 1 MG PO TABS
4.0000 mg | ORAL_TABLET | Freq: Every day | ORAL | Status: DC
  Filled 2017-05-03: qty 4

## 2017-05-03 MED ORDER — SODIUM CHLORIDE 0.9 % IV SOLN
INTRAVENOUS | Status: DC
  Administered 2017-05-03 – 2017-05-08 (×10): via INTRAVENOUS

## 2017-05-03 MED ORDER — TRAZODONE HCL 100 MG PO TABS
100.0000 mg | ORAL_TABLET | Freq: Every day | ORAL | Status: DC
  Administered 2017-05-03 – 2017-05-11 (×9): 100 mg via ORAL
  Filled 2017-05-03: qty 2
  Filled 2017-05-03 (×4): qty 1
  Filled 2017-05-03: qty 2
  Filled 2017-05-03 (×3): qty 1

## 2017-05-03 MED ORDER — LORAZEPAM 1 MG PO TABS
1.0000 mg | ORAL_TABLET | Freq: Four times a day (QID) | ORAL | Status: DC | PRN
  Administered 2017-05-03 – 2017-05-12 (×21): 1 mg via ORAL
  Filled 2017-05-03 (×12): qty 1
  Filled 2017-05-03: qty 2
  Filled 2017-05-03 (×4): qty 1
  Filled 2017-05-03: qty 2
  Filled 2017-05-03 (×3): qty 1

## 2017-05-03 MED ORDER — LEVOTHYROXINE SODIUM 50 MCG PO TABS
25.0000 ug | ORAL_TABLET | Freq: Every day | ORAL | Status: DC
  Administered 2017-05-03 – 2017-05-12 (×10): 25 ug via ORAL
  Filled 2017-05-03 (×8): qty 1
  Filled 2017-05-03: qty 2
  Filled 2017-05-03: qty 1

## 2017-05-03 MED ORDER — SODIUM CHLORIDE 0.9 % IV SOLN
INTRAVENOUS | Status: DC
  Administered 2017-05-04: 16:00:00 via INTRAVENOUS

## 2017-05-03 MED ORDER — MORPHINE SULFATE (PF) 2 MG/ML IV SOLN
2.0000 mg | INTRAVENOUS | Status: DC | PRN
  Administered 2017-05-03 – 2017-05-09 (×22): 2 mg via INTRAVENOUS
  Filled 2017-05-03 (×22): qty 1

## 2017-05-03 MED ORDER — PHENYLEPHRINE HCL 10 MG/ML IJ SOLN
INTRAMUSCULAR | Status: AC
  Filled 2017-05-03: qty 1

## 2017-05-03 NOTE — H&P (Signed)
[facility] [place] at [facility]   PATIENT NAME: Jacqueline Williams    MR#:  [id number]  DATE OF BIRTH:  [birth year]  DATE OF ADMISSION:  05/03/2017  PRIMARY CARE PHYSICIAN: Clovis Pu, Arturo Morton, DO   REQUESTING/REFERRING PHYSICIAN:   CHIEF COMPLAINT:   Chief Complaint  Patient presents with  . Weakness    HISTORY OF PRESENT ILLNESS: Jacqueline Williams  is a 66 y.o. female with a known history of Chronic back pain, hypothyroidism presented to the emergency room with generalized weakness and fatigue. She also experienced multiple episodes of rectal bleed since Monday that prompted her to come to the emergency room. Has some back pain and chest discomfort. Hemoglobin was around 5.3 when she was evaluated in the emergency room. Patient recently had colonoscopy on Wednesday which showed hemorrhoids. No vomiting of blood. No coughing of blood. No abdominal pain nausea and vomiting. Hospitalist service was consulted for further care of the patient.  PAST MEDICAL HISTORY:   Past Medical History:  Diagnosis Date  . Back pain   . Depression   . Thyroid disease     PAST SURGICAL HISTORY: Past Surgical History:  Procedure Laterality Date  . ABDOMINAL HYSTERECTOMY    . CHOLECYSTECTOMY    . COLONOSCOPY WITH PROPOFOL N/A 04/30/2017   Procedure: COLONOSCOPY WITH PROPOFOL;  Surgeon: Wyline Mood, MD;  Location: [facility] ENDOSCOPY;  Service: Endoscopy;  Laterality: N/A;  . gastric bipass    . HERNIA REPAIR      SOCIAL HISTORY:  Social History  Substance Use Topics  . Smoking status: Never Smoker  . Smokeless tobacco: Never Used  . Alcohol use No    FAMILY HISTORY:  Family History  Problem Relation Age of Onset  . Lung cancer Father   . Hypertension Neg Hx   . Diabetes Neg Hx     DRUG ALLERGIES:  Allergies  Allergen Reactions  . Other Shortness Of Breath    caffergot  . Penicillins   . Seroquel [Quetiapine Fumarate] Other (See Comments)    hallucinations    REVIEW OF  SYSTEMS:   CONSTITUTIONAL: No fever, fatigue or weakness.  EYES: No blurred or double vision.  EARS, NOSE, AND THROAT: No tinnitus or ear pain.  RESPIRATORY: No cough, shortness of breath, wheezing or hemoptysis.  CARDIOVASCULAR: No chest pain, orthopnea, edema.  GASTROINTESTINAL: No nausea, vomiting, diarrhea and abdominal pain.  Has rectal bleed. GENITOURINARY: No dysuria, hematuria.  ENDOCRINE: No polyuria, nocturia,  HEMATOLOGY: Has anemia, no easy bruising or bleeding SKIN: No rash or lesion. MUSCULOSKELETAL: No joint pain or arthritis.   NEUROLOGIC: No tingling, numbness, weakness.  PSYCHIATRY: No anxiety or depression.   MEDICATIONS AT HOME:  Prior to Admission medications   Medication Sig Start Date End Date Taking? Authorizing Provider  acetaminophen (TYLENOL) 500 MG tablet Take 500 mg by mouth every 6 (six) hours as needed.   Yes [provider]  aspirin EC 81 MG tablet Take 81 mg by mouth daily.   Yes [provider]  calcium-vitamin D (OSCAL WITH D) 500-200 MG-UNIT tablet Take 1 tablet by mouth.   Yes [provider]  cetirizine (ZYRTEC) 10 MG tablet Take 10 mg by mouth daily.   Yes [provider]  cyanocobalamin (,VITAMIN B-12,) 1000 MCG/ML injection Inject 1 mL into the skin every 30 (thirty) days. 04/16/17  Yes [provider]  diclofenac sodium (VOLTAREN) 1 % GEL Apply topically 4 (four) times daily. Prn arthritis pain   Yes [provider]  ferrous gluconate (FERGON) 324 MG tablet Take 324 mg by mouth daily with breakfast.   Yes [provider]  fluticasone (FLONASE) 50 MCG/ACT nasal spray Place 1 spray into both nostrils daily as needed for allergies or rhinitis.   Yes [provider]  lactase (LACTAID) 3000 units tablet Take by mouth 3 (three) times daily with meals.   Yes [provider]  levothyroxine (SYNTHROID, LEVOTHROID) 25 MCG tablet Take 25 mcg by mouth daily. 04/26/17  Yes [provider]  lipase/protease/amylase (CREON) 12000 units CPEP capsule Take 24,000 Units by mouth 3 (three) times daily before meals.   Yes [provider]  loperamide (IMODIUM) 2 MG capsule Take by mouth as needed for diarrhea or loose stools.   Yes [provider]  LORazepam (ATIVAN) 1 MG tablet Take 0.5-1 mg by mouth 4 (four) times daily. 0.5 mg 0630,  1 mg 1100, 0.5 mg 1600, 1 mg 2030 04/05/17  Yes [provider]  magnesium oxide (MAG-OX) 400 MG tablet Take 400 mg by mouth daily.   Yes [provider]  oxybutynin (DITROPAN) 5 MG tablet Take 5 mg by mouth 2 (two) times daily.   Yes [provider]  oxyCODONE-acetaminophen (PERCOCET) 5-325 MG tablet Take 1 tablet by mouth 4 (four) times daily. 0630,1100,1600,2030   Yes [provider]  pantoprazole (PROTONIX) 40 MG tablet Take 40 mg by mouth daily.   Yes [provider]  promethazine (PHENERGAN) 25 MG tablet Take 25 mg by mouth every 6 (six) hours as needed. 04/28/17  Yes [provider]  risperidone (RISPERDAL) 4 MG tablet Take 4 mg by mouth daily. 03/11/17  Yes [provider]  simethicone (MYLICON) 80 MG chewable tablet Chew 80 mg by mouth every 6 (six) hours as needed for flatulence.   Yes [provider]  traZODone (DESYREL) 100 MG tablet Take 100 mg by mouth at bedtime. 04/14/17  Yes [provider]  venlafaxine XR (EFFEXOR-XR) 150 MG 24 hr capsule Take 150 mg by mouth daily. 02/25/17  Yes [provider]  gabapentin (NEURONTIN) 100 MG capsule Take 100 mg by mouth 2 (two) times daily. 02/25/17   [provider]      PHYSICAL EXAMINATION:   VITAL SIGNS: Blood pressure 130/80, pulse (!) 112, temperature 98.5 F (36.9 C), temperature source Oral, resp. rate (!) 24, SpO2 97 %.  GENERAL:  66 y.o.-year-old patient lying in the bed with no acute distress.  EYES: Pupils equal, round, reactive to light and accommodation. No scleral  icterus. Extraocular muscles intact. Pallor present. HEENT: Head atraumatic, normocephalic. Oropharynx and nasopharynx clear.  NECK:  Supple, no jugular venous distention. No thyroid enlargement, no tenderness.  LUNGS: Normal breath sounds bilaterally, no wheezing, rales,rhonchi or crepitation. No use of accessory muscles of respiration.  CARDIOVASCULAR: S1, S2 normal. No murmurs, rubs, or gallops.  ABDOMEN: Soft, nontender, nondistended. Bowel sounds present. No organomegaly or mass.  EXTREMITIES: No pedal edema, cyanosis, or clubbing.  NEUROLOGIC: Cranial nerves II through XII are intact. Muscle strength 5/5 in all extremities. Sensation intact. Gait not checked.  PSYCHIATRIC: The patient is alert and oriented x 3.  SKIN: No obvious rash, lesion, or ulcer.   LABORATORY PANEL:   CBC  Recent Labs Lab 04/28/17 1016 04/29/17 0740 04/29/17 1617 04/29/17 2134 04/30/17 0639 05/03/17 0158  WBC 14.8* 12.2*  --   --  12.0* 18.7*  HGB 8.9* 10.0* 9.7* 10.1* 9.2* 5.3*  HCT 27.0* 29.3* 28.3* 29.2* 26.5*  15.6*  PLT 348 305  --   --  299 290  MCV 95.4 90.6  --   --  91.3 93.8  MCH 31.5 30.9  --   --  31.6 32.1  MCHC 33.0 34.1  --   --  34.6 34.2  RDW 12.7 16.1*  --   --  16.2* 15.5*  LYMPHSABS 1.3  --   --   --   --   --   MONOABS 0.7  --   --   --   --   --   EOSABS 0.1  --   --   --   --   --   BASOSABS 0.0  --   --   --   --   --    ------------------------------------------------------------------------------------------------------------------  Chemistries   Recent Labs Lab 04/28/17 1016 04/29/17 0740 04/30/17 0639 05/03/17 0158  NA 135 139 141 134*  K 4.3 4.1 4.1 4.0  CL 105 106 106 100*  CO2 22 25 29 26   GLUCOSE 223* 125* 118* 177*  BUN 41* 27* 13 43*  CREATININE 1.36* 1.00 0.92 1.29*  CALCIUM 7.9* 8.6* 8.3* 8.4*  AST 19  --   --  23  ALT 16  --   --  15  ALKPHOS 81  --   --  74  BILITOT 0.6  --   --  0.5    ------------------------------------------------------------------------------------------------------------------ estimated creatinine clearance is 51.6 mL/min (A) (by C-G formula based on SCr of 1.29 mg/dL (H)). ------------------------------------------------------------------------------------------------------------------ No results for input(s): TSH, T4TOTAL, T3FREE, THYROIDAB in the last 72 hours.  Invalid input(s): FREET3   Coagulation profile No results for input(s): INR, PROTIME in the last 168 hours. ------------------------------------------------------------------------------------------------------------------- No results for input(s): DDIMER in the last 72 hours. -------------------------------------------------------------------------------------------------------------------  Cardiac Enzymes  Recent Labs Lab 04/28/17 1016 05/03/17 0158  TROPONINI <0.03 <0.03   ------------------------------------------------------------------------------------------------------------------ Invalid input(s): POCBNP  ---------------------------------------------------------------------------------------------------------------  Urinalysis    Component Value Date/Time   COLORURINE STRAW (A) 04/28/2017 2056   APPEARANCEUR CLEAR (A) 04/28/2017 2056   APPEARANCEUR Hazy 04/28/2013 2126   LABSPEC 1.014 04/28/2017 2056   LABSPEC 1.011 04/28/2013 2126   PHURINE 5.0 04/28/2017 2056   GLUCOSEU NEGATIVE 04/28/2017 2056   GLUCOSEU Negative 04/28/2013 2126   HGBUR MODERATE (A) 04/28/2017 2056   BILIRUBINUR NEGATIVE 04/28/2017 2056   BILIRUBINUR Negative 04/28/2013 2126   KETONESUR NEGATIVE 04/28/2017 2056   PROTEINUR NEGATIVE 04/28/2017 2056   NITRITE NEGATIVE 04/28/2017 2056   LEUKOCYTESUR NEGATIVE 04/28/2017 2056   LEUKOCYTESUR 3+ 04/28/2013 2126     RADIOLOGY: Dg Chest 1 View  Result Date: 05/03/2017 CLINICAL DATA:  66 y/o  F; chest pain and back pain. EXAM: CHEST 1 VIEW  COMPARISON:  None. FINDINGS: The heart size and mediastinal contours are within normal limits. Both lungs are clear. The visualized skeletal structures are unremarkable. IMPRESSION: No active disease. Electronically Signed   By: Mitzi HansenLance  Furusawa-Stratton M.D.   On: 05/03/2017 02:56    EKG: Orders placed or performed during the hospital encounter of 05/03/17  . EKG 12-Lead  . EKG 12-Lead    IMPRESSION AND PLAN: 66 year old female patient with history of chronic back pain, hypothyroidism presented to the emergency room with rectal bleed and weakness. Admitting diagnosis 1. Acute gastrointestinal bleeding 2. Symptomatic anemia 3. Chronic back pain 4. Hypothyroidism Treatment plan Admit patient to medical floor Transfuse 3 units of packed red blood cells intravenously Keep patient nothing by mouth Gastroenterology consultation IV Protonix drip DVT prophylaxis sequential compression  device to lower extremities Serial hemoglobin and hematocrit monitoring Hold aspirin Supportive care.  All the records are reviewed and case discussed with ED provider. Management plans discussed with the patient, family and they are in agreement.  CODE STATUS:FULL CODE Code Status History    Date Active Date Inactive Code Status Order ID Comments User Context   04/28/2017  3:02 PM 04/30/2017  7:04 PM Full Code [id number]  Altamese Dilling, MD Inpatient       TOTAL TIME TAKING CARE OF THIS PATIENT: 51 minutes.    Ihor Austin M.D on 05/03/2017 at 4:04 AM  Between 7am to 6pm - Pager - [phone]  After 6pm go to [email or web address] - password [email or web address] [facility]  [facility]  Office  [phone]  CC: Primary care physician; Clovis Pu, Arturo Morton, DO

## 2017-05-03 NOTE — ED Provider Notes (Signed)
[facility] Emergency Department Provider Note  ____________________________________________   First MD Initiated Contact with Patient 05/03/17 0148     (approximate)  I have reviewed the triage vital signs and the nursing notes.   HISTORY  Chief Complaint Weakness   HPI Jacqueline Williams is a 66 y.o. female with a recent admission to the hospital for what was presumed to be a lower GI bleed from either hemorrhoids or diverticulosis was presenting to the emergency department today with weakness, chest pain and further gastrointestinal bleeding. She says that she had multiple bowel movements Route the day today which appeared bloody. She says that she has been weak and now with chest pain which she describes as a 9 out of 10 and to the center of her chest which radiates through to her back. She says the pain is sharp. No associated shortness of breath. She takes a daily aspirin.   Past Medical History:  Diagnosis Date  . Back pain   . Depression   . Thyroid disease     Patient Active Problem List   Diagnosis Date Noted  . GI bleed 04/28/2017    Past Surgical History:  Procedure Laterality Date  . ABDOMINAL HYSTERECTOMY    . CHOLECYSTECTOMY    . COLONOSCOPY WITH PROPOFOL N/A 04/30/2017   Procedure: COLONOSCOPY WITH PROPOFOL;  Surgeon: Wyline MoodAnna, Kiran, MD;  [facility];  Service: Endoscopy;  Laterality: N/A;  . gastric bipass    . HERNIA REPAIR      Prior to Admission medications   Medication Sig Start Date End Date Taking? Authorizing Provider  acetaminophen (TYLENOL) 500 MG tablet Take 500 mg by mouth every 6 (six) hours as needed.    [provider]  aspirin EC 81 MG tablet Take 81 mg by mouth daily.    [provider]  calcium-vitamin D (OSCAL WITH D) 500-200 MG-UNIT tablet Take 1 tablet by mouth.    [provider]  cetirizine (ZYRTEC) 10 MG tablet Take 10 mg by mouth daily.    [provider]    cyanocobalamin (,VITAMIN B-12,) 1000 MCG/ML injection Inject 1 mL into the skin every 30 (thirty) days. 04/16/17   [provider]  diclofenac sodium (VOLTAREN) 1 % GEL Apply topically 4 (four) times daily. Prn arthritis pain    [provider]  ferrous gluconate (FERGON) 324 MG tablet Take 324 mg by mouth daily with breakfast.    [provider]  fluticasone (FLONASE) 50 MCG/ACT nasal spray Place 1 spray into both nostrils daily as needed for allergies or rhinitis.    [provider]  gabapentin (NEURONTIN) 100 MG capsule Take 100 mg by mouth 2 (two) times daily. 02/25/17   [provider]  lactase (LACTAID) 3000 units tablet Take by mouth 3 (three) times daily with meals.    [provider]  levothyroxine (SYNTHROID, LEVOTHROID) 25 MCG tablet Take 25 mcg by mouth daily. 04/26/17   [provider]  lipase/protease/amylase (CREON) 12000 units CPEP capsule Take 24,000 Units by mouth 3 (three) times daily before meals.    [provider]  loperamide (IMODIUM) 2 MG capsule Take by mouth as needed for diarrhea or loose stools.    [provider]  LORazepam (ATIVAN) 1 MG tablet Take 0.5-1 mg by mouth 4 (four) times daily. 0.5 mg 0630,  1 mg 1100, 0.5 mg 1600, 1 mg 2030 04/05/17   [provider]  magnesium oxide (MAG-OX) 400 MG tablet Take 400 mg  by mouth daily.    [provider]  oxybutynin (DITROPAN) 5 MG tablet Take 5 mg by mouth 2 (two) times daily.    [provider]  oxyCODONE-acetaminophen (PERCOCET) 5-325 MG tablet Take 1 tablet by mouth 4 (four) times daily. [phone]    [provider]  pantoprazole (PROTONIX) 40 MG tablet Take 40 mg by mouth daily.    [provider]  promethazine (PHENERGAN) 25 MG tablet Take 25 mg by mouth every 6 (six) hours as needed. 04/28/17   [provider]  risperidone (RISPERDAL) 4 MG tablet Take 4 mg by mouth daily. 03/11/17    [provider]  simethicone (MYLICON) 80 MG chewable tablet Chew 80 mg by mouth every 6 (six) hours as needed for flatulence.    [provider]  traZODone (DESYREL) 100 MG tablet Take 100 mg by mouth at bedtime. 04/14/17   [provider]  venlafaxine XR (EFFEXOR-XR) 150 MG 24 hr capsule Take 150 mg by mouth daily. 02/25/17   [provider]    Allergies Other; Penicillins; and Seroquel [quetiapine fumarate]  Family History  Problem Relation Age of Onset  . Lung cancer Father     Social History Social History  Substance Use Topics  . Smoking status: Never Smoker  . Smokeless tobacco: Never Used  . Alcohol use No    Review of Systems  Constitutional: No fever/chills Eyes: No visual changes. ENT: No sore throat. Cardiovascular:as above Respiratory: Denies shortness of breath. Gastrointestinal: No abdominal pain.  No nausea, no vomiting.   No constipation. Genitourinary: Negative for dysuria. Musculoskeletal: Negative for back pain. Skin: Negative for rash. Neurological: Negative for headaches, focal weakness or numbness.   ____________________________________________   PHYSICAL EXAM:  VITAL SIGNS: ED Triage Vitals  Enc Vitals Group     BP 05/03/17 0152 (!) 110/46     Pulse Rate 05/03/17 0142 (!) 120     Resp 05/03/17 0142 14     Temp 05/03/17 0152 97.7 F (36.5 C)     Temp Source 05/03/17 0152 Oral     SpO2 05/03/17 0142 98 %     Weight --      Height --      Head Circumference --      Peak Flow --      Pain Score --      Pain Loc --      Pain Edu? --      Excl. in GC? --     Constitutional: Alert and oriented. Well appearing and in no acute distress.Pale Eyes: Conjunctivae are pale. PERRL. EOMI. Head: Atraumatic. Nose: No congestion/rhinnorhea. Mouth/Throat: Mucous membranes are moist.  Oropharynx non-erythematous. Neck: No stridor.   Cardiovascular: tachycardic, regular rhythm. Grossly normal heart sounds.     Respiratory: Normal respiratory effort.  No retractions. Lungs CTAB. Gastrointestinal: Soft and nontender. No distention. No CVA tenderness.  Hematochezia grossly on digital rectal exam.  Musculoskeletal: No lower extremity tenderness nor edema.  No joint effusions. Neurologic:  Normal speech and language. No gross focal neurologic deficits are appreciated.  Skin:  Skin is warm, dry and intact. No rash noted. Psychiatric: Mood and affect are normal. Speech and behavior are normal.  ____________________________________________   LABS (all labs ordered are listed, but only abnormal results are displayed)  Labs Reviewed  COMPREHENSIVE METABOLIC PANEL - Abnormal; Notable for the following:       Result Value   Sodium 134 (*)    Chloride 100 (*)  Glucose, Bld 177 (*)    BUN 43 (*)    Creatinine, Ser 1.29 (*)    Calcium 8.4 (*)    Total Protein 5.0 (*)    Albumin 2.6 (*)    GFR calc non Af Amer 43 (*)    GFR calc Af Amer 50 (*)    All other components within normal limits  CBC - Abnormal; Notable for the following:    WBC 18.7 (*)    RBC 1.66 (*)    Hemoglobin 5.3 (*)    HCT 15.6 (*)    RDW 15.5 (*)    All other components within normal limits  TROPONIN I  POC OCCULT BLOOD, ED  TYPE AND SCREEN   ____________________________________________  EKG  ED ECG REPORT I, Arelia Longest, the attending physician, personally viewed and interpreted this ECG.   Date: 05/03/2017  EKG Time: 0145  Rate: 117  Rhythm: sinus tachycardia  Axis: normal  Intervals:none  ST&T Change: Minimal ST depression in the lateral leads which is likely rate related.  ____________________________________________  RADIOLOGY  Pending cxr ____________________________________________   PROCEDURES  Procedure(s) performed:   Procedures  Critical Care performed:  CRITICAL CARE Performed by: Arelia Longest   Total critical care time: 35 minutes  Critical care time was  exclusive of separately billable procedures and treating other patients.  Critical care was necessary to treat or prevent imminent or life-threatening deterioration.  Critical care was time spent personally by me on the following activities: development of treatment plan with patient and/or surrogate as well as nursing, discussions with consultants, evaluation of patient's response to treatment, examination of patient, obtaining history from patient or surrogate, ordering and performing treatments and interventions, ordering and review of laboratory studies, ordering and review of radiographic studies, pulse oximetry and re-evaluation of patient's condition.   ____________________________________________   INITIAL IMPRESSION / ASSESSMENT AND PLAN / ED COURSE  Pertinent labs & imaging results that were available during my care of the patient were reviewed by me and considered in my medical decision making (see chart for details).  ----------------------------------------- 2:59 AM on 05/03/2017 -----------------------------------------  Patient with critical anemia. She is symptomatic from the anemia. She'll be admitted to the hospital for transfusion and further observation and treatment. Signed out to Dr. Tobi Bastos.  The diagnosis as well as the treatment plan was explained to the patient as well as her daughter who is at the bedside. They're understanding willing to comply. Patient likely with chest pain from demand because of her tachycardia and anemia.      ____________________________________________   FINAL CLINICAL IMPRESSION(S) / ED DIAGNOSES  Final diagnoses:  Gastrointestinal hemorrhage, unspecified gastrointestinal hemorrhage type  Anemia, unspecified type  Chest pain, unspecified type      NEW MEDICATIONS STARTED DURING THIS VISIT:  New Prescriptions   No medications on file     Note:  This document was prepared using Dragon voice recognition software and may  include unintentional dictation errors.    Myrna Blazer, MD 05/03/17 [phone]

## 2017-05-03 NOTE — ED Notes (Signed)
Patient's IV no longer patent. RN Onalee Huaavid to bedside to attempt new IV placement

## 2017-05-03 NOTE — Progress Notes (Signed)
[facility] - [place] at [facility]   PATIENT NAME: Jacqueline Williams    MR#:  [id number]  DATE OF BIRTH:  11-19-1951  SUBJECTIVE:  CHIEF COMPLAINT:   Chief Complaint  Patient presents with  . Weakness   The patient is 66 year old Caucasian female with past medical history significant for history of chronic back pain, hypothyroidism, who presented to the hospital with complaints of generalized weakness and fatigue, multiple episodes of rectal bleeding. She admitted of back pain as well as chest pain. On arrival to emergency room, her hemoglobin level was found to be 5.3. Apparently, patient had colonoscopy 2 days ago, which revealed hemorrhoids. No nausea, vomiting. No hematemesis was noted. Patient was admitted to the hospital for further evaluation, treatment, she was transfused 1 unit of packed red blood cells after which hemoglobin level improved to 6.6. She feels somewhat better now. Last bleeding was yesterday at home. No diarrheal stool, however, had diarrhea 04/30/2017. Stool cultures were not taken. Review of Systems  Constitutional: Negative for chills, fever and weight loss.  HENT: Negative for congestion.   Eyes: Negative for blurred vision and double vision.  Respiratory: Negative for cough, sputum production, shortness of breath and wheezing.   Cardiovascular: Positive for chest pain. Negative for palpitations, orthopnea, leg swelling and PND.  Gastrointestinal: Positive for blood in stool. Negative for abdominal pain, constipation, diarrhea, nausea and vomiting.  Genitourinary: Negative for dysuria, frequency, hematuria and urgency.  Musculoskeletal: Positive for back pain. Negative for falls.  Neurological: Positive for weakness. Negative for dizziness, tremors, focal weakness and headaches.  Endo/Heme/Allergies: Does not bruise/bleed easily.  Psychiatric/Behavioral: Negative for depression. The patient does not have insomnia.     VITAL SIGNS: Blood  pressure (!) 109/39, pulse 98, temperature 98.5 F (36.9 C), temperature source Oral, resp. rate 16, height 5\' 7"  (1.702 m), weight 123.9 kg (273 lb 1.6 oz), SpO2 94 %.  PHYSICAL EXAMINATION:   GENERAL:  66 y.o.-year-old pale white patient lying in the bed with no acute distress.  EYES: Pupils equal, round, reactive to light and accommodation. No scleral icterus. Extraocular muscles intact.  HEENT: Head atraumatic, normocephalic. Oropharynx and nasopharynx clear.  NECK:  Supple, no jugular venous distention. No thyroid enlargement, no tenderness.  LUNGS: Normal breath sounds bilaterally, no wheezing, rales,rhonchi or crepitation. No use of accessory muscles of respiration.  CARDIOVASCULAR: S1, S2 normal. No murmurs, rubs, or gallops.  ABDOMEN: Soft, nontender, nondistended. Bowel sounds present. No organomegaly or mass.  EXTREMITIES: No pedal edema, cyanosis, or clubbing.  NEUROLOGIC: Cranial nerves II through XII are intact. Muscle strength 5/5 in all extremities. Sensation intact. Gait not checked.  PSYCHIATRIC: The patient is alert and oriented x 3.  SKIN: No obvious rash, lesion, or ulcer.   ORDERS/RESULTS REVIEWED:   CBC  Recent Labs Lab 04/28/17 1016 04/29/17 0740  04/29/17 2134 04/30/17 4098 05/03/17 0158 05/03/17 0349 05/03/17 0949  WBC 14.8* 12.2*  --   --  12.0* 18.7*  --   --   HGB 8.9* 10.0*  < > 10.1* 9.2* 5.3* 5.0* 6.6*  HCT 27.0* 29.3*  < > 29.2* 26.5* 15.6* 14.5* 19.9*  PLT 348 305  --   --  299 290  --   --   MCV 95.4 90.6  --   --  91.3 93.8  --   --   MCH 31.5 30.9  --   --  31.6 32.1  --   --   MCHC 33.0 34.1  --   --  34.6 34.2  --   --   RDW 12.7 16.1*  --   --  16.2* 15.5*  --   --   LYMPHSABS 1.3  --   --   --   --   --   --   --   MONOABS 0.7  --   --   --   --   --   --   --   EOSABS 0.1  --   --   --   --   --   --   --   BASOSABS 0.0  --   --   --   --   --   --   --   < > = values in this interval not  displayed. ------------------------------------------------------------------------------------------------------------------  Chemistries   Recent Labs Lab 04/28/17 1016 04/29/17 0740 04/30/17 0639 05/03/17 0158 05/03/17 0949  NA 135 139 141 134* 135  K 4.3 4.1 4.1 4.0 4.3  CL 105 106 106 100* 102  CO2 22 25 29 26 24   GLUCOSE 223* 125* 118* 177* 130*  BUN 41* 27* 13 43* 37*  CREATININE 1.36* 1.00 0.92 1.29* 1.14*  CALCIUM 7.9* 8.6* 8.3* 8.4* 8.5*  AST 19  --   --  23  --   ALT 16  --   --  15  --   ALKPHOS 81  --   --  74  --   BILITOT 0.6  --   --  0.5  --    ------------------------------------------------------------------------------------------------------------------ estimated creatinine clearance is 69 mL/min (A) (by C-G formula based on SCr of 1.14 mg/dL (H)). ------------------------------------------------------------------------------------------------------------------ No results for input(s): TSH, T4TOTAL, T3FREE, THYROIDAB in the last 72 hours.  Invalid input(s): FREET3  Cardiac Enzymes  Recent Labs Lab 04/28/17 1016 05/03/17 0158  TROPONINI <0.03 <0.03   ------------------------------------------------------------------------------------------------------------------ Invalid input(s): POCBNP ---------------------------------------------------------------------------------------------------------------  RADIOLOGY: Dg Chest 1 View  Result Date: 05/03/2017 CLINICAL DATA:  66 y/o  F; chest pain and back pain. EXAM: CHEST 1 VIEW COMPARISON:  None. FINDINGS: The heart size and mediastinal contours are within normal limits. Both lungs are clear. The visualized skeletal structures are unremarkable. IMPRESSION: No active disease. Electronically Signed   By: Mitzi HansenLance  Furusawa-Stratton M.D.   On: 05/03/2017 02:56    EKG:  Orders placed or performed during the hospital encounter of 05/03/17  . EKG 12-Lead  . EKG 12-Lead    ASSESSMENT AND PLAN:  Active  Problems:   GI bleed  #1. Acute post hemorrhagic anemia, transfuse packed red blood cells as needed, follow hemoglobin level after transfusion, follow every 8 hours #2. Gastrointestinal bleed, status post recent colonoscopy, a neurologist is consulted for further evaluation/recommendations. Patient had bowel movement today, no bleeding was noted, last bleeding was yesterday. #3. Hyponatremia, resolved with IV fluid administration #4 acute renal insufficiency, improving with therapy, follow creatinine in the morning #5. Leukocytosis, etiology is unclear, patient had recent colonoscopy, is always concern of perforation, however, patient's abdomen is soft, doubt acute abdomen, follow CBC in the morning, patient is afebrile, not on antibiotic therapy at this time. C. difficile and stool cultures are pending #6 diarrhea, seemed to be resolved, C. difficile and stool PCR are pending  Management plans discussed with the patient, family and they are in agreement.   DRUG ALLERGIES:  Allergies  Allergen Reactions  . Other Shortness Of Breath    caffergot  . Penicillins   . Seroquel [Quetiapine Fumarate] Other (See Comments)    hallucinations  CODE STATUS:     Code Status Orders        Start     Ordered   05/03/17 0526  Full code  Continuous     05/03/17 0525    Code Status History    Date Active Date Inactive Code Status Order ID Comments User Context   04/28/2017  3:02 PM 04/30/2017  7:04 PM Full Code [id number]  Altamese Dilling, MD Inpatient    Advance Directive Documentation     Most Recent Value  Type of Advance Directive  Healthcare Power of Attorney  Pre-existing out of facility DNR order (yellow form or pink MOST form)  -  "MOST" Form in Place?  -      TOTAL Critical care TIME TAKING CARE OF THIS PATIENT: 40 minutes.    Katharina Caper M.D on 05/03/2017 at 1:57 PM  Between 7am to 6pm - Pager - [phone]  After 6pm go to [email or web address] - password [email or web address] [facility]  [facility]  Office  [phone]  CC: Primary care physician; Clovis Pu, Arturo Morton, DO

## 2017-05-03 NOTE — ED Notes (Signed)
ED Provider at bedside. 

## 2017-05-03 NOTE — OR Nursing (Signed)
I obtained consent for the EGD from patient and signed.  Asked pt if anesthesiologist had talked to her pre-op.  She stated he had .  Witnessed by CRNA Ala DachJ. Crisson and Dr. Orvan FalconerBeavers.  We proceeded to do a time out and the procedure.  Dr. Randa NgoPiscitello came in the procedure room at the completion and stated he refused to sign consent b/c he had not seen the pt preop

## 2017-05-03 NOTE — ED Triage Notes (Signed)
Pt to ED via EMS from home with c/o CP and back pain that started earlier today, pt recently discharged for GI bleed x4 days ago, pt pale in color, HR 120s. Pt A&Ox4.

## 2017-05-03 NOTE — ED Notes (Signed)
Admitting Provider at bedside. 

## 2017-05-03 NOTE — Progress Notes (Signed)
Brief Summary of EGD Report EGD performed 05/03/17 I do not have access to Provation to write the complete note  EGD to the small bowel was normal. Food and medications were identified. 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 The remainder of the examined mucosa appeared normal. No blood present. No mucosal abnormalities.  No source of bleeding identified on this EGD. Suspect a stuttering diverticular bleed as the source of her recurrent GI blood loss anemia. Would recommend a tagged bleeding scan with any additional bleeding noted during this hospitalization.  Diet advanced today.  No additional inpatient GI evaluation planned at this time.

## 2017-05-03 NOTE — Transfer of Care (Signed)
Immediate Anesthesia Transfer of Care Note  Patient: Jacqueline Williams  Procedure(s) Performed: Procedure(s): ESOPHAGOGASTRODUODENOSCOPY (EGD) WITH PROPOFOL (N/A)  Patient Location: PACU  Anesthesia Type:General  Level of Consciousness: sedated  Airway & Oxygen Therapy: Patient Spontanous Breathing and Patient connected to nasal cannula oxygen  Post-op Assessment: Report given to RN and Post -op Vital signs reviewed and stable  Post vital signs: Reviewed and stable  Last Vitals:  Vitals:   05/03/17 1321 05/03/17 1400  BP: (!) 109/39 (!) (P) 94/43  Pulse: 98 (P) 84  Resp: 16 (P) 14  Temp: 36.9 C (P) 36.4 C    Last Pain:  Vitals:   05/03/17 1400  TempSrc: (P) Tympanic  PainSc:          Complications: No apparent anesthesia complications

## 2017-05-03 NOTE — Anesthesia Preprocedure Evaluation (Signed)
Anesthesia Evaluation  Patient identified by MRN, date of birth, ID band Patient awake    Reviewed: Allergy & Precautions, H&P , NPO status , Patient's Chart, lab work & pertinent test results, reviewed documented beta blocker date and time   History of Anesthesia Complications Negative for: history of anesthetic complications  Airway Mallampati: III  TM Distance: >3 FB Neck ROM: full    Dental  (+) Edentulous Upper, Edentulous Lower   Pulmonary neg pulmonary ROS,    breath sounds clear to auscultation       Cardiovascular Exercise Tolerance: Good negative cardio ROS   Rhythm:Regular Rate:Normal     Neuro/Psych PSYCHIATRIC DISORDERS (Depression) Anxiety Depression negative neurological ROS     GI/Hepatic Neg liver ROS, GERD  ,  Endo/Other  neg diabetesHypothyroidism   Renal/GU negative Renal ROS  negative genitourinary   Musculoskeletal   Abdominal (+) + obese,   Peds  Hematology negative hematology ROS (+)   Anesthesia Other Findings Past Medical History: No date: Back pain No date: Depression No date: Thyroid disease   Reproductive/Obstetrics negative OB ROS                             Anesthesia Physical  Anesthesia Plan  ASA: II  Anesthesia Plan: General   Post-op Pain Management:    Induction:   Airway Management Planned: Natural Airway and Nasal Cannula  Additional Equipment:   Intra-op Plan:   Post-operative Plan:   Informed Consent: I have reviewed the patients History and Physical, chart, labs and discussed the procedure including the risks, benefits and alternatives for the proposed anesthesia with the patient or authorized representative who has indicated his/her understanding and acceptance.   Dental Advisory Given  Plan Discussed with: Anesthesiologist, CRNA and Surgeon  Anesthesia Plan Comments:         Anesthesia Quick Evaluation

## 2017-05-03 NOTE — Anesthesia Postprocedure Evaluation (Signed)
Anesthesia Post Note  Patient: Milus MallickKay W Eckard  Procedure(s) Performed: Procedure(s) (LRB): ESOPHAGOGASTRODUODENOSCOPY (EGD) WITH PROPOFOL (N/A)  Patient location during evaluation: Endoscopy Anesthesia Type: General Level of consciousness: awake and alert Pain management: pain level controlled Vital Signs Assessment: post-procedure vital signs reviewed and stable Respiratory status: spontaneous breathing, nonlabored ventilation, respiratory function stable and patient connected to nasal cannula oxygen Cardiovascular status: blood pressure returned to baseline and stable Postop Assessment: no signs of nausea or vomiting Anesthetic complications: no     Last Vitals:  Vitals:   05/03/17 1508 05/03/17 1549  BP: (!) 134/54 (!) 118/43  Pulse: 100 99  Resp: 16 16  Temp: 36.6 C 37.2 C    Last Pain:  Vitals:   05/03/17 1549  TempSrc: Oral  PainSc:                  Cleda MccreedyJoseph K Ladd Cen

## 2017-05-03 NOTE — Consult Note (Signed)
Reason for Consult: GI bleed Referring Physician: Dr. Reatha Harps Pyreddy  Jacqueline Williams is an 66 y.o. female.  HPI: Seen in consultation with concerns for GI bleed. The history is obtained through the patient and review of the electronic health record. She was hospitalized for a presumed diverticular bleed 04/28/17-04/30/17. At that time, he presented with several episodes of rectal bleeding. Dr. Vicente Males performed a colonoscopy 04/30/17 that showed grade 1 internal hemorrhoids and sigmoid diverticulosis. She was discharged to home with a hemoglobin of 9.2.  She felt weak on discharge and continued to feel bad. Yesterday she was even too weak to put on make-up. She started passing dark red blood per rectum yesterday evening. There was no abdominal pain. No nausea or vomiting. No associated symptoms. No identified exacerbating or relieving features. She's had no further bleeding since she was admitted.   No prior upper endoscopy. She takes ASA 81 mg daily. She used Naproxen for back pain over the last month as prescribed by her primary care provider, but, this was discontinued during her recent hospitalization.   Past Medical History:  Diagnosis Date  . Back pain   . Depression   . Thyroid disease     Past Surgical History:  Procedure Laterality Date  . ABDOMINAL HYSTERECTOMY    . CHOLECYSTECTOMY    . COLONOSCOPY WITH PROPOFOL N/A 04/30/2017   Procedure: COLONOSCOPY WITH PROPOFOL;  Surgeon: Jonathon Bellows, MD;  Location: [facility] ENDOSCOPY;  Service: Endoscopy;  Laterality: N/A;  . gastric bipass    . HERNIA REPAIR      Family History  Problem Relation Age of Onset  . Lung cancer Father   . Hypertension Neg Hx   . Diabetes Neg Hx   There is no family history of GI disease.    Social History:  reports that she has never smoked. She has never used smokeless tobacco. She reports that she does not drink alcohol or use drugs. 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  Allergies:  Allergies  Allergen Reactions  . Other Shortness Of Breath    caffergot  . Penicillins   . Seroquel [Quetiapine Fumarate] Other (See Comments)    hallucinations    Medications:  I have reviewed the patient's current medications. Prior to Admission:  Prescriptions Prior to Admission  Medication Sig Dispense Refill Last Dose  . acetaminophen (TYLENOL) 500 MG tablet Take 500 mg by mouth every 6 (six) hours as needed.   PRN at PRN  . aspirin EC 81 MG tablet Take 81 mg by mouth daily.   05/02/2017 at Unknown time  . calcium-vitamin D (OSCAL WITH D) 500-200 MG-UNIT tablet Take 1 tablet by mouth.   05/02/2017 at Unknown time  . cetirizine (ZYRTEC) 10 MG tablet Take 10 mg by mouth daily.   05/02/2017 at Unknown time  . cyanocobalamin (,VITAMIN B-12,) 1000 MCG/ML injection Inject 1 mL into the skin every 30 (thirty) days.   Past Month at Unknown time  . diclofenac sodium (VOLTAREN) 1 % GEL Apply topically 4 (four) times daily. Prn arthritis pain   PRN at PRN  . ferrous gluconate (FERGON) 324 MG tablet Take 324 mg by mouth daily with breakfast.   05/02/2017 at Unknown time  . fluticasone (FLONASE) 50 MCG/ACT nasal spray Place 1 spray into both nostrils daily as needed for allergies or rhinitis.   PRN at PRN  . lactase (LACTAID) 3000 units tablet Take by mouth 3 (three) times daily with meals.  PRN at PRN  . levothyroxine (SYNTHROID, LEVOTHROID) 25 MCG tablet Take 25 mcg by mouth daily.   05/02/2017 at Unknown time  . lipase/protease/amylase (CREON) 12000 units CPEP capsule Take 24,000 Units by mouth 3 (three) times daily before meals.   05/02/2017 at Unknown time  . loperamide (IMODIUM) 2 MG capsule Take by mouth as needed for diarrhea or loose stools.   PRN at PRN  . LORazepam (ATIVAN) 1 MG tablet Take 0.5-1 mg by mouth 4 (four) times daily. 0.5 mg 0630,  1 mg 1100, 0.5 mg 1600, 1 mg 2030   05/02/2017 at Unknown time  . magnesium oxide (MAG-OX) 400 MG tablet Take 400 mg by  mouth daily.   05/02/2017 at Unknown time  . oxybutynin (DITROPAN) 5 MG tablet Take 5 mg by mouth 2 (two) times daily.   05/02/2017 at Unknown time  . oxyCODONE-acetaminophen (PERCOCET) 5-325 MG tablet Take 1 tablet by mouth 4 (four) times daily. 0630,1100,1600,2030   PRN at PRN  . pantoprazole (PROTONIX) 40 MG tablet Take 40 mg by mouth daily.   05/02/2017 at Unknown time  . promethazine (PHENERGAN) 25 MG tablet Take 25 mg by mouth every 6 (six) hours as needed.   PRN at PRN  . risperidone (RISPERDAL) 4 MG tablet Take 4 mg by mouth daily.   05/02/2017 at Unknown time  . simethicone (MYLICON) 80 MG chewable tablet Chew 80 mg by mouth every 6 (six) hours as needed for flatulence.   PRN at PRN  . traZODone (DESYREL) 100 MG tablet Take 100 mg by mouth at bedtime.   05/02/2017 at Unknown time  . venlafaxine XR (EFFEXOR-XR) 150 MG 24 hr capsule Take 150 mg by mouth daily.   05/02/2017 at Unknown time  . gabapentin (NEURONTIN) 100 MG capsule Take 100 mg by mouth 2 (two) times daily.   Not Taking at Unknown time   Scheduled: . [MAR Hold] ferrous gluconate  324 mg Oral Q breakfast  . [MAR Hold] levothyroxine  25 mcg Oral QAC breakfast  . [MAR Hold] magnesium oxide  400 mg Oral Daily  . [MAR Hold] oxybutynin  5 mg Oral BID  . [MAR Hold] risperiDONE  4 mg Oral QHS  . [MAR Hold] traZODone  100 mg Oral QHS  . [MAR Hold] venlafaxine XR  150 mg Oral QHS   Continuous: . sodium chloride    . pantoprozole (PROTONIX) infusion 8 mg/hr (05/03/17 0452)   PRN:[MAR Hold] acetaminophen **OR** [MAR Hold] acetaminophen, [MAR Hold] fluticasone, [MAR Hold] LORazepam, [MAR Hold]  morphine injection, [MAR Hold] ondansetron **OR** [MAR Hold] ondansetron (ZOFRAN) IV  Results for orders placed or performed during the hospital encounter of 05/03/17 (from the past 48 hour(s))  Comprehensive metabolic panel     Status: Abnormal   Collection Time: 05/03/17  1:58 AM  Result Value Ref Range   Sodium 134 (L) 135 - 145 mmol/L    Potassium 4.0 3.5 - 5.1 mmol/L   Chloride 100 (L) 101 - 111 mmol/L   CO2 26 22 - 32 mmol/L   Glucose, Bld 177 (H) 65 - 99 mg/dL   BUN 43 (H) 6 - 20 mg/dL   Creatinine, Ser 1.29 (H) 0.44 - 1.00 mg/dL   Calcium 8.4 (L) 8.9 - 10.3 mg/dL   Total Protein 5.0 (L) 6.5 - 8.1 g/dL   Albumin 2.6 (L) 3.5 - 5.0 g/dL   AST 23 15 - 41 U/L   ALT 15 14 - 54 U/L   Alkaline Phosphatase 74 38 -  126 U/L   Total Bilirubin 0.5 0.3 - 1.2 mg/dL   GFR calc non Af Amer 43 (L) >60 mL/min   GFR calc Af Amer 50 (L) >60 mL/min    Comment: (NOTE) The eGFR has been calculated using the CKD EPI equation. This calculation has not been validated in all clinical situations. eGFR's persistently <60 mL/min signify possible Chronic Kidney Disease.    Anion gap 8 5 - 15  CBC     Status: Abnormal   Collection Time: 05/03/17  1:58 AM  Result Value Ref Range   WBC 18.7 (H) 3.6 - 11.0 K/uL   RBC 1.66 (L) 3.80 - 5.20 MIL/uL   Hemoglobin 5.3 (L) 12.0 - 16.0 g/dL   HCT 15.6 (L) 35.0 - 47.0 %   MCV 93.8 80.0 - 100.0 fL   MCH 32.1 26.0 - 34.0 pg   MCHC 34.2 32.0 - 36.0 g/dL   RDW 15.5 (H) 11.5 - 14.5 %   Platelets 290 150 - 440 K/uL  Type and screen [place]     Status: None (Preliminary result)   Collection Time: 05/03/17  1:58 AM  Result Value Ref Range   ABO/RH(D) O POS    Antibody Screen NEG    Sample Expiration 05/06/2017    Unit Number [id number]    Blood Component Type RED CELLS,LR    Unit division 00    Status of Unit ISSUED    Transfusion Status OK TO TRANSFUSE    Crossmatch Result COMPATIBLE    Unit Number [id number]    Blood Component Type RBC LR PHER1    Unit division 00    Status of Unit ALLOCATED    Transfusion Status OK TO TRANSFUSE    Crossmatch Result COMPATIBLE    Unit Number [id number]    Blood Component Type RBC LR PHER2    Unit division 00    Status of Unit ISSUED    Transfusion Status OK TO TRANSFUSE    Crossmatch Result COMPATIBLE   Troponin I      Status: None   Collection Time: 05/03/17  1:58 AM  Result Value Ref Range   Troponin I <0.03 <0.03 ng/mL  Hemoglobin and hematocrit, blood     Status: Abnormal   Collection Time: 05/03/17  3:49 AM  Result Value Ref Range   Hemoglobin 5.0 (L) 12.0 - 16.0 g/dL   HCT 14.5 (LL) 35.0 - 47.0 %    Comment: RESULT REPEATED AND VERIFIED CRITICAL RESULT CALLED TO, READ BACK BY AND VERIFIED WITH: Marquis Lunch AT 9381 05/03/17.PMH   Prepare RBC     Status: None   Collection Time: 05/03/17  4:00 AM  Result Value Ref Range   Order Confirmation ORDER PROCESSED BY BLOOD BANK   Hemoglobin and hematocrit, blood     Status: Abnormal   Collection Time: 05/03/17  9:49 AM  Result Value Ref Range   Hemoglobin 6.6 (L) 12.0 - 16.0 g/dL    Comment: RESULT REPEATED AND VERIFIED   HCT 19.9 (L) 35.0 - 01.7 %  Basic metabolic panel     Status: Abnormal   Collection Time: 05/03/17  9:49 AM  Result Value Ref Range   Sodium 135 135 - 145 mmol/L   Potassium 4.3 3.5 - 5.1 mmol/L   Chloride 102 101 - 111 mmol/L   CO2 24 22 - 32 mmol/L   Glucose, Bld 130 (H) 65 - 99 mg/dL   BUN 37 (H) 6 - 20 mg/dL   Creatinine,  Ser 1.14 (H) 0.44 - 1.00 mg/dL   Calcium 8.5 (L) 8.9 - 10.3 mg/dL   GFR calc non Af Amer 50 (L) >60 mL/min   GFR calc Af Amer 58 (L) >60 mL/min    Comment: (NOTE) The eGFR has been calculated using the CKD EPI equation. This calculation has not been validated in all clinical situations. eGFR's persistently <60 mL/min signify possible Chronic Kidney Disease.    Anion gap 9 5 - 15    Dg Chest 1 View  Result Date: 05/03/2017 CLINICAL DATA:  66 y/o  F; chest pain and back pain. EXAM: CHEST 1 VIEW COMPARISON:  None. FINDINGS: The heart size and mediastinal contours are within normal limits. Both lungs are clear. The visualized skeletal structures are unremarkable. IMPRESSION: No active disease. Electronically Signed   By: Kristine Garbe M.D.   On: 05/03/2017 02:56    Review of Systems   Constitutional: Negative for chills and fever.  HENT: Negative for hearing loss and tinnitus.   Eyes: Negative for blurred vision and double vision.  Respiratory: Negative for cough and hemoptysis.   Cardiovascular: Negative for chest pain and palpitations.  Gastrointestinal: Negative for abdominal pain, heartburn, nausea and vomiting.  Genitourinary: Negative for dysuria and hematuria.  Musculoskeletal: Positive for back pain. Negative for myalgias.  Skin: Negative for itching and rash.  Neurological: Negative for dizziness and headaches.  Endo/Heme/Allergies: Negative for environmental allergies. Does not bruise/bleed easily.  Psychiatric/Behavioral: Negative for depression and suicidal ideas.   Blood pressure (!) 123/46, pulse 99, temperature 98.6 F (37 C), temperature source Oral, resp. rate 16, height '5\' 7"'  (1.702 m), weight 273 lb 1.6 oz (123.9 kg), SpO2 94 %. Physical Exam  Constitutional: She is oriented to person, place, and time. She appears well-developed and well-nourished.  HENT:  Head: Normocephalic and atraumatic.  Mouth/Throat: No oropharyngeal exudate.  Eyes: Conjunctivae are normal. Pupils are equal, round, and reactive to light.  Neck: Normal range of motion. Neck supple. No thyromegaly present.  Cardiovascular: Normal rate and regular rhythm.   Respiratory: Effort normal and breath sounds normal.  GI: Soft. Bowel sounds are normal. She exhibits no distension. There is no tenderness. There is no rebound.  Central obesity. Well healed midline abdominal scar.  Musculoskeletal: Normal range of motion. She exhibits no edema.  Neurological: She is alert and oriented to person, place, and time.  Skin: Skin is warm and dry. No erythema.  Psychiatric: She has a normal mood and affect. Her behavior is normal.    Assessment/Plan: Recurrent GI blood loss anemia Hematochezia Elevated BUN - 43 on admission, 13 on discharge earlier this week Sigmoid diverticulosis on  colonoscopy 04/30/17 Internal hemorrhoids on colonoscopy 04/30/17 Recent NSAIDs - one month of Naproxen, ASA 81 mg daily  She has had no further bleeding since arrival. She remains hemodynamically stable. This is likely to be a stuttering diverticular bleed. But, upper sources should be considered - especially given the dramatic increase in her BUN.  EGD recommended. The patient was consented at the bedside today discussing risks, benefits, and alternatives to endoscopic evaluation. In particular, we discussed the risks that include, but are not limited to, reaction to medication, cardiopulmonary compromise, bleeding requiring blood transfusion, aspiration resulting in pneumonia, perforation requiring surgery, and even death. She acknowledges all of these risks and asks that we proceed.  In the meantime, continue with NPO, IV PPI, and serial hgb/hct with transfusion as necessary. High fiber diet recommended.   Thornton Park 05/03/2017, 12:15 PM

## 2017-05-03 NOTE — Anesthesia Post-op Follow-up Note (Cosign Needed)
Anesthesia QCDR form completed.        

## 2017-05-04 LAB — BASIC METABOLIC PANEL
Anion gap: 6 (ref 5–15)
BUN: 26 mg/dL — ABNORMAL HIGH (ref 6–20)
CO2: 27 mmol/L (ref 22–32)
Calcium: 7.6 mg/dL — ABNORMAL LOW (ref 8.9–10.3)
Chloride: 106 mmol/L (ref 101–111)
Creatinine, Ser: 1.06 mg/dL — ABNORMAL HIGH (ref 0.44–1.00)
GFR calc Af Amer: >60 mL/min (ref >60)
GFR calc non Af Amer: 55 mL/min — ABNORMAL LOW (ref >60)
Glucose, Bld: 112 mg/dL — ABNORMAL HIGH (ref 65–99)
Potassium: 3.9 mmol/L (ref 3.5–5.1)
Sodium: 139 mmol/L (ref 135–145)

## 2017-05-04 LAB — GASTROINTESTINAL PANEL BY PCR, STOOL (REPLACES STOOL CULTURE)

## 2017-05-04 LAB — CBC
HCT: 20.2 % — ABNORMAL LOW (ref 35.0–47.0)
Hemoglobin: 6.9 g/dL — ABNORMAL LOW (ref 12.0–16.0)
MCH: 32.2 pg (ref 26.0–34.0)
MCHC: 34.1 g/dL (ref 32.0–36.0)
MCV: 94.4 fL (ref 80.0–100.0)
Platelets: 221 10*3/uL (ref 150–440)
RBC: 2.14 MIL/uL — ABNORMAL LOW (ref 3.80–5.20)
RDW: 15.9 % — ABNORMAL HIGH (ref 11.5–14.5)
WBC: 14.1 10*3/uL — ABNORMAL HIGH (ref 3.6–11.0)

## 2017-05-04 LAB — HEMOGLOBIN AND HEMATOCRIT, BLOOD
HCT: 21.4 % — ABNORMAL LOW (ref 35.0–47.0)
HCT: 25.9 % — ABNORMAL LOW (ref 35.0–47.0)
Hemoglobin: 7.2 g/dL — ABNORMAL LOW (ref 12.0–16.0)
Hemoglobin: 8.8 g/dL — ABNORMAL LOW (ref 12.0–16.0)

## 2017-05-04 LAB — C DIFFICILE QUICK SCREEN W PCR REFLEX
C Diff antigen: NEGATIVE
C Diff interpretation: NOT DETECTED
C Diff toxin: NEGATIVE

## 2017-05-04 LAB — PREPARE RBC (CROSSMATCH)

## 2017-05-04 MED ORDER — SODIUM CHLORIDE 0.9 % IV SOLN
Freq: Once | INTRAVENOUS | Status: AC
  Administered 2017-05-04: 09:00:00 via INTRAVENOUS

## 2017-05-04 NOTE — Progress Notes (Signed)
[facility]   PATIENT NAME: Jacqueline BatemanKay Brester    MR#:  [id number]  DATE OF BIRTH:  1951-04-01  SUBJECTIVE:  CHIEF COMPLAINT:   Chief Complaint  Patient presents with  . Weakness   The patient is 66 year old Caucasian female with past medical history significant for history of chronic back pain, hypothyroidism, who presented to the hospital with complaints of generalized weakness and fatigue, multiple episodes of rectal bleeding. She admitted of back pain as well as chest pain. On arrival to emergency room, her hemoglobin level was found to be 5.3. Apparently, patient had colonoscopy few days ago, which revealed hemorrhoids. No nausea, vomiting. No hematemesis was noted. Patient was admitted to the hospital for further evaluation, treatment, she was transfused 3 units of  packed red blood cells after which hemoglobin level improved to 6.9 today. She feels overall better , no more bleeding, no diarrheal stool. She is hungry and wants to have clear liquid diet. She underwent EGD yesterday, which was unremarkable. A radiologist recommended packed red blood cell scan if bleeding recurs.   Review of Systems  Constitutional: Negative for chills, fever and weight loss.  HENT: Negative for congestion.   Eyes: Negative for blurred vision and double vision.  Respiratory: Negative for cough, sputum production, shortness of breath and wheezing.   Cardiovascular: Positive for chest pain. Negative for palpitations, orthopnea, leg swelling and PND.  Gastrointestinal: Positive for blood in stool. Negative for abdominal pain, constipation, diarrhea, nausea and vomiting.  Genitourinary: Negative for dysuria, frequency, hematuria and urgency.  Musculoskeletal: Positive for back pain. Negative for falls.  Neurological: Positive for weakness. Negative for dizziness, tremors, focal weakness and headaches.  Endo/Heme/Allergies: Does not bruise/bleed easily.    Psychiatric/Behavioral: Negative for depression. The patient does not have insomnia.     VITAL SIGNS: Blood pressure (!) 110/51, pulse (!) 102, temperature 99 F (37.2 C), temperature source Oral, resp. rate 16, height 5\' 7"  (1.702 m), weight 123.9 kg (273 lb 1.6 oz), SpO2 94 %.  PHYSICAL EXAMINATION:   GENERAL:  66 y.o.-year-old pale white patient lying in the bed with no acute distress.  EYES: Pupils equal, round, reactive to light and accommodation. No scleral icterus. Extraocular muscles intact.  HEENT: Head atraumatic, normocephalic. Oropharynx and nasopharynx clear.  NECK:  Supple, no jugular venous distention. No thyroid enlargement, no tenderness.  LUNGS: Normal breath sounds bilaterally, no wheezing, rales,rhonchi or crepitation. No use of accessory muscles of respiration.  CARDIOVASCULAR: S1, S2 normal. No murmurs, rubs, or gallops.  ABDOMEN: Soft, nontender, nondistended. Bowel sounds present. No organomegaly or mass.  EXTREMITIES: No pedal edema, cyanosis, or clubbing.  NEUROLOGIC: Cranial nerves II through XII are intact. Muscle strength 5/5 in all extremities. Sensation intact. Gait not checked.  PSYCHIATRIC: The patient is alert and oriented x 3.  SKIN: No obvious rash, lesion, or ulcer.   ORDERS/RESULTS REVIEWED:   CBC  Recent Labs Lab 04/28/17 1016 04/29/17 0740  04/30/17 [id number] 05/03/17 0158 05/03/17 0349 05/03/17 0949 05/03/17 2124 05/04/17 0342  WBC 14.8* 12.2*  --  12.0* 18.7*  --   --   --  14.1*  HGB 8.9* 10.0*  < > 9.2* 5.3* 5.0* 6.6* 7.2* 6.9*  HCT 27.0* 29.3*  < > 26.5* 15.6* 14.5* 19.9* 21.9* 20.2*  PLT 348 305  --  299 290  --   --   --  221  MCV 95.4 90.6  --  91.3 93.8  --   --   --  94.4  MCH 31.5 30.9  --  31.6 32.1  --   --   --  32.2  MCHC 33.0 34.1  --  34.6 34.2  --   --   --  34.1  RDW 12.7 16.1*  --  16.2* 15.5*  --   --   --  15.9*  LYMPHSABS 1.3  --   --   --   --   --   --   --   --   MONOABS 0.7  --   --   --   --   --   --   --    --   EOSABS 0.1  --   --   --   --   --   --   --   --   BASOSABS 0.0  --   --   --   --   --   --   --   --   < > = values in this interval not displayed. ------------------------------------------------------------------------------------------------------------------  Chemistries   Recent Labs Lab 04/28/17 1016 04/29/17 0740 04/30/17 0639 05/03/17 0158 05/03/17 0949 05/04/17 0342  NA 135 139 141 134* 135 139  K 4.3 4.1 4.1 4.0 4.3 3.9  CL 105 106 106 100* 102 106  CO2 22 25 29 26 24 27   GLUCOSE 223* 125* 118* 177* 130* 112*  BUN 41* 27* 13 43* 37* 26*  CREATININE 1.36* 1.00 0.92 1.29* 1.14* 1.06*  CALCIUM 7.9* 8.6* 8.3* 8.4* 8.5* 7.6*  AST 19  --   --  23  --   --   ALT 16  --   --  15  --   --   ALKPHOS 81  --   --  74  --   --   BILITOT 0.6  --   --  0.5  --   --    ------------------------------------------------------------------------------------------------------------------ estimated creatinine clearance is 74.2 mL/min (A) (by C-G formula based on SCr of 1.06 mg/dL (H)). ------------------------------------------------------------------------------------------------------------------ No results for input(s): TSH, T4TOTAL, T3FREE, THYROIDAB in the last 72 hours.  Invalid input(s): FREET3  Cardiac Enzymes  Recent Labs Lab 04/28/17 1016 05/03/17 0158  TROPONINI <0.03 <0.03   ------------------------------------------------------------------------------------------------------------------ Invalid input(s): POCBNP ---------------------------------------------------------------------------------------------------------------  RADIOLOGY: Dg Chest 1 View  Result Date: 05/03/2017 CLINICAL DATA:  66 y/o  F; chest pain and back pain. EXAM: CHEST 1 VIEW COMPARISON:  None. FINDINGS: The heart size and mediastinal contours are within normal limits. Both lungs are clear. The visualized skeletal structures are unremarkable. IMPRESSION: No active disease. Electronically  Signed   By: Mitzi Hansen M.D.   On: 05/03/2017 02:56    EKG:  Orders placed or performed during the hospital encounter of 05/03/17  . EKG 12-Lead  . EKG 12-Lead    ASSESSMENT AND PLAN:  Active Problems:   GI bleed  #1. Acute post hemorrhagic anemia, transfuse 1 more unit of packed red blood cells , recheck hemoglobin  level after transfusion, folloclosely, no more bleeding, blood pressure is better, however, patient was tachycardic prior to today's transfusion  #2. Gastrointestinal bleed, status post recent colonoscopy, status post EGD 05/03/2017, unremarkable, appreciate gastroenterology's input, recommended tagged red blood cell scan if bleeding recurs. Initiate clear liquid diet, full liquid diet for lunch.  #3. Hyponatremia, resolved with IV fluid administration, likely dehydration related  #4 acute renal insufficiency, improving with transfusion, IV fluids, , follow creatinine in the morning #5. Leukocytosis, etiology is unclear, improving overall, follow CBC in the  morning, patient is afebrile, not on antibiotic therapy at this time. C. difficile and stool cultures are ordered  #6 diarrhea, resolving , C. difficile and stool PCR are ordered, pending   Management plans discussed with the patient, family and they are in agreement.   DRUG ALLERGIES:  Allergies  Allergen Reactions  . Other Shortness Of Breath    caffergot  . Penicillins   . Seroquel [Quetiapine Fumarate] Other (See Comments)    hallucinations    CODE STATUS:     Code Status Orders        Start     Ordered   05/03/17 0526  Full code  Continuous     05/03/17 0525    Code Status History    Date Active Date Inactive Code Status Order ID Comments User Context   04/28/2017  3:02 PM 04/30/2017  7:04 PM Full Code [id number]  Altamese Dilling, MD Inpatient    Advance Directive Documentation     Most Recent Value  Type of Advance Directive  Healthcare Power of Attorney  Pre-existing out of  facility DNR order (yellow form or pink MOST form)  -  "MOST" Form in Place?  -      TOTAL TIME TAKING CARE OF THIS PATIENT: 35 minutes.    Katharina Caper M.D on 05/04/2017 at 11:24 AM  Between 7am to 6pm - Pager - [phone]  After 6pm go to [email or web address] - password [email or web address] [facility]  [place] [facility]  Office  [phone]  CC: Primary care physician; Clovis Pu, Arturo Morton, DO

## 2017-05-04 NOTE — Progress Notes (Signed)
CC: I am anemic and still feel weak  Subjective: Since I last evaluated the patient she continues to feel weak and tired although she is overall slightly better. No family was present at the time of my evaluation. She is tolerating a full diet at the time of my evaluation. She has not have a BM since 05/02/17. No noted overt GI blood loss.   Objective: Vital signs in last 24 hours: Temp:  [97.8 F (36.6 C)-99.3 F (37.4 C)] 99.3 F (37.4 C) (05/13 1553) Pulse Rate:  [96-108] 97 (05/13 1553) Resp:  [16-20] 20 (05/13 1553) BP: (109-141)/(45-79) 120/45 (05/13 1553) SpO2:  [94 %-98 %] 95 % (05/13 1553) Last BM Date: 05/02/17  Intake/Output from previous day: 05/12 0701 - 05/13 0700 In: 2709.9 [I.V.:1858.7; Blood:851.2] Out: 1500 [Urine:1500] Intake/Output this shift: Total I/O In: 1201.9 [P.O.:480; I.V.:468.6; Blood:253.3] Out: 1500 [Urine:1500]   Constitutional: She is oriented to person, place, and time. She appears well-developed and well-nourished.  Cardiovascular: Normal rate and regular rhythm.   Respiratory: Effort normal and breath sounds normal.  GI: Soft. Bowel sounds are normal. She exhibits no distension. There is no tenderness. There is no rebound.  Central obesity. Well healed midline abdominal scar.    Lab Results:  Recent Labs  05/03/17 0158  05/03/17 2124 05/04/17 0342 05/04/17 1421  WBC 18.7*  --   --  14.1*  --   HGB 5.3*  < > 7.2* 6.9* 7.2*  HCT 15.6*  < > 21.9* 20.2* 21.4*  PLT 290  --   --  221  --   < > = values in this interval not displayed. BMET  Recent Labs  05/03/17 0158 05/03/17 0949 05/04/17 0342  NA 134* 135 139  K 4.0 4.3 3.9  CL 100* 102 106  CO2 26 24 27   GLUCOSE 177* 130* 112*  BUN 43* 37* 26*  CREATININE 1.29* 1.14* 1.06*  CALCIUM 8.4* 8.5* 7.6*   LFT  Recent Labs  05/03/17 0158  PROT 5.0*  ALBUMIN 2.6*  AST 23  ALT 15  ALKPHOS 74  BILITOT 0.5   PT/INR No results for input(s): LABPROT, INR in the last 72  hours. Hepatitis Panel No results for input(s): HEPBSAG, HCVAB, HEPAIGM, HEPBIGM in the last 72 hours. C-Diff No results for input(s): CDIFFTOX in the last 72 hours. No results for input(s): CDIFFPCR in the last 72 hours. Fecal Lactopherrin No results for input(s): FECLLACTOFRN in the last 72 hours.  Studies/Results: Dg Chest 1 View  Result Date: 05/03/2017 CLINICAL DATA:  66 y/o  F; chest pain and back pain. EXAM: CHEST 1 VIEW COMPARISON:  None. FINDINGS: The heart size and mediastinal contours are within normal limits. Both lungs are clear. The visualized skeletal structures are unremarkable. IMPRESSION: No active disease. Electronically Signed   By: Mitzi HansenLance  Furusawa-Stratton M.D.   On: 05/03/2017 02:56    Medications: I have reviewed the patient's current medications.  Assessment/Plan: Recurrent GI blood loss anemia    - admitted with suspected diverticular bleed last week    - readmitted with additional hematochezia 48 hours after discharge    - EGD 05/03/17 was normal Elevated BUN     - 43 on admission    - 13 on discharge earlier this week    - normalizing every day while she is here Sigmoid diverticulosis on colonoscopy 04/30/17 Internal hemorrhoids on colonoscopy 04/30/17 Recent NSAIDs - one month of Naproxen, ASA 81 mg daily Prior gastric bypass   Recently hospitalized for hematochezia  attributed to diverticular bleeding based on colonoscopy results. Admitted with recurrent hematochezia and symptomatic anemia. Minimal response to transfusion despite no additional overt blood loss. Hemoglobin was 6.6 before two units and is now 6.9. EGD identified no source of the anemia.  Recommend serial hgb/hct with transfusion as necessary. If she has additional hematochezia, would recommend a tagged bleeding scan given the suspected stuttering diverticular bleed. Would consider capsule endoscopy if that is negative. Avoid NSAIDs as she is able.    LOS: 1 day   Tressia Danas 05/04/2017, 5:54 PM

## 2017-05-05 ENCOUNTER — Encounter: Payer: Self-pay | Admitting: Gastroenterology

## 2017-05-05 DIAGNOSIS — N289 Disorder of kidney and ureter, unspecified: Secondary | ICD-10-CM

## 2017-05-05 DIAGNOSIS — E860 Dehydration: Secondary | ICD-10-CM

## 2017-05-05 DIAGNOSIS — D62 Acute posthemorrhagic anemia: Secondary | ICD-10-CM

## 2017-05-05 DIAGNOSIS — E871 Hypo-osmolality and hyponatremia: Secondary | ICD-10-CM

## 2017-05-05 DIAGNOSIS — Z9289 Personal history of other medical treatment: Secondary | ICD-10-CM

## 2017-05-05 DIAGNOSIS — R197 Diarrhea, unspecified: Secondary | ICD-10-CM

## 2017-05-05 DIAGNOSIS — D72829 Elevated white blood cell count, unspecified: Secondary | ICD-10-CM

## 2017-05-05 HISTORY — DX: Personal history of other medical treatment: Z92.89

## 2017-05-05 LAB — CBC
HCT: 23.9 % — ABNORMAL LOW (ref 35.0–47.0)
Hemoglobin: 8.1 g/dL — ABNORMAL LOW (ref 12.0–16.0)
MCH: 31.6 pg (ref 26.0–34.0)
MCHC: 34 g/dL (ref 32.0–36.0)
MCV: 92.8 fL (ref 80.0–100.0)
Platelets: 220 10*3/uL (ref 150–440)
RBC: 2.57 MIL/uL — ABNORMAL LOW (ref 3.80–5.20)
RDW: 16.4 % — ABNORMAL HIGH (ref 11.5–14.5)
WBC: 14.1 10*3/uL — ABNORMAL HIGH (ref 3.6–11.0)

## 2017-05-05 LAB — BASIC METABOLIC PANEL
Anion gap: 7 (ref 5–15)
BUN: 19 mg/dL (ref 6–20)
CO2: 26 mmol/L (ref 22–32)
Calcium: 7.4 mg/dL — ABNORMAL LOW (ref 8.9–10.3)
Chloride: 106 mmol/L (ref 101–111)
Creatinine, Ser: 0.95 mg/dL (ref 0.44–1.00)
GFR calc Af Amer: >60 mL/min (ref >60)
GFR calc non Af Amer: >60 mL/min (ref >60)
Glucose, Bld: 145 mg/dL — ABNORMAL HIGH (ref 65–99)
Potassium: 3.8 mmol/L (ref 3.5–5.1)
Sodium: 139 mmol/L (ref 135–145)

## 2017-05-05 LAB — HEMOGLOBIN: Hemoglobin: 7.3 g/dL — ABNORMAL LOW (ref 12.0–16.0)

## 2017-05-05 MED ORDER — FERROUS GLUCONATE 324 (38 FE) MG PO TABS: 324.0000 mg | ORAL_TABLET | Freq: Three times a day (TID) | ORAL | 3 refills | Status: DC

## 2017-05-05 MED ORDER — SODIUM CHLORIDE 0.9 % IV SOLN
510.0000 mg | Freq: Once | INTRAVENOUS | Status: AC
  Administered 2017-05-05: 510 mg via INTRAVENOUS
  Filled 2017-05-05: qty 17

## 2017-05-05 MED ORDER — LOPERAMIDE HCL 2 MG PO CAPS
2.0000 mg | ORAL_CAPSULE | Freq: Four times a day (QID) | ORAL | Status: DC | PRN
  Administered 2017-05-05 – 2017-05-06 (×3): 2 mg via ORAL
  Filled 2017-05-05 (×4): qty 1

## 2017-05-05 NOTE — Progress Notes (Signed)
Pt called RN to state "my IV has come out." RN found PIV sitting on the bed. IV site bruising, but no blood. Site covered with gauze. It was reported that pt was a hard stick and required ultrasound guidance to get previous access. Order placed for IV team.

## 2017-05-05 NOTE — Discharge Summary (Addendum)
[facility] - [place] at [facility]   PATIENT NAME: Jacqueline Williams    MR#:  [id number]  DATE OF BIRTH:  [birth year]  DATE OF ADMISSION:  05/03/2017 ADMITTING PHYSICIAN: Ihor Austin, MD  DATE OF DISCHARGE: No discharge date for patient encounter.  PRIMARY CARE PHYSICIAN: Clovis Pu, Arturo Morton, DO     ADMISSION DIAGNOSIS:  Gastrointestinal hemorrhage, unspecified gastrointestinal hemorrhage type [K92.2] Anemia, unspecified type [D64.9] Chest pain, unspecified type [R07.9]  DISCHARGE DIAGNOSIS:  Active Problems:   GI bleed   Acute posthemorrhagic anemia   History of recent blood transfusion   Hyponatremia   Dehydration   Acute renal insufficiency   Leukocytosis   Diarrhea   SECONDARY DIAGNOSIS:   Past Medical History:  Diagnosis Date  . Back pain   . Depression   . Thyroid disease     .pro HOSPITAL COURSE:  The patient is 66 year old Caucasian female with past medical history significant for history of chronic back pain, hypothyroidism, who presented to the hospital with complaints of generalized weakness and fatigue, multiple episodes of rectal bleeding. She admitted of back pain as well as chest pain. On arrival to emergency room, her hemoglobin level was found to be 5.3. Apparently, patient had colonoscopy few days ago, which revealed hemorrhoids. No nausea, vomiting. No hematemesis was noted. Patient was admitted to the hospital for further evaluation, treatment, she was transfused 3 units of  packed red blood cells after which hemoglobin level improved to 8.1 on the day of discharge . Marland Kitchen She underwent EGD 05/03/2017, which was unremarkable. Gastroenterologist recommended packed red blood cell scan if bleeding recurs. The diet was advanced and patient had no recurrent bleeding, but some recurrent diarrhea. Stool cultures showed no enteropathogenic bacteria or C. difficile. Patient is a multiple level will be rechecked later in the day, and if it is  stable, she will likely be discharged home today Discussion by problem: #1. Acute post hemorrhagic anemia, Status post 3 units of packed red blood cell transfusion during this admission, hemoglobin level remains stable,  Follow closely, no more bleeding, blood pressure is good, advanced diet, possible discharge home today if no more bleeding, stop aspirin therapy until recommended to restart by  her primary care physician or gastroenterologist  #2. Gastrointestinal bleed, status post recent colonoscopy, status post EGD 05/03/2017, unremarkable, appreciate gastroenterology's input, recommended tagged red blood cell scan if bleeding recurs. Advanced diet to soft, possible discharge home in no recurrent bleeding, if hemoglobin level remains stable #3 Hyponatremia, resolved with IV fluid administration, likely dehydration related  #4 acute renal insufficiency, , resolved with transfusion, IV fluids, , follow creatinine as outpatient due to dehydration/ongoing diarrhea #5 Leukocytosis, etiology is unclear, improving overall, follow CBC as out patient. The patient is afebrile, not on antibiotic therapy at this time. C. difficile and stool cultures are  negative  #6 diarrhea, ongoing, difficile and stool PCR   were negative, supportive therapy with Imodium,  patient was advised to advance diet, liquid intake as tolerated, continue Imodium as needed  DISCHARGE CONDITIONS:   Stable  CONSULTS OBTAINED:  Treatment Team:  Tressia Danas, MD  DRUG ALLERGIES:   Allergies  Allergen Reactions  . Other Shortness Of Breath    caffergot  . Penicillins   . Seroquel [Quetiapine Fumarate] Other (See Comments)    hallucinations    DISCHARGE MEDICATIONS:   Current Discharge Medication List    CONTINUE these medications which have CHANGED   Details  ferrous gluconate (FERGON)  324 MG tablet Take 1 tablet (324 mg total) by mouth 3 (three) times daily with meals. Qty: 90 tablet, Refills: 3      CONTINUE  these medications which have NOT CHANGED   Details  acetaminophen (TYLENOL) 500 MG tablet Take 500 mg by mouth every 6 (six) hours as needed.    calcium-vitamin D (OSCAL WITH D) 500-200 MG-UNIT tablet Take 1 tablet by mouth.    cetirizine (ZYRTEC) 10 MG tablet Take 10 mg by mouth daily.    cyanocobalamin (,VITAMIN B-12,) 1000 MCG/ML injection Inject 1 mL into the skin every 30 (thirty) days.    fluticasone (FLONASE) 50 MCG/ACT nasal spray Place 1 spray into both nostrils daily as needed for allergies or rhinitis.    lactase (LACTAID) 3000 units tablet Take by mouth 3 (three) times daily with meals.    levothyroxine (SYNTHROID, LEVOTHROID) 25 MCG tablet Take 25 mcg by mouth daily.    lipase/protease/amylase (CREON) 12000 units CPEP capsule Take 24,000 Units by mouth 3 (three) times daily before meals.    loperamide (IMODIUM) 2 MG capsule Take by mouth as needed for diarrhea or loose stools.    LORazepam (ATIVAN) 1 MG tablet Take 0.5-1 mg by mouth 4 (four) times daily. 0.5 mg 0630,  1 mg 1100, 0.5 mg 1600, 1 mg 2030    oxybutynin (DITROPAN) 5 MG tablet Take 5 mg by mouth 2 (two) times daily.    oxyCODONE-acetaminophen (PERCOCET) 5-325 MG tablet Take 1 tablet by mouth 4 (four) times daily. 0630,1100,1600,2030    pantoprazole (PROTONIX) 40 MG tablet Take 40 mg by mouth daily.    promethazine (PHENERGAN) 25 MG tablet Take 25 mg by mouth every 6 (six) hours as needed.    risperidone (RISPERDAL) 4 MG tablet Take 4 mg by mouth daily.    simethicone (MYLICON) 80 MG chewable tablet Chew 80 mg by mouth every 6 (six) hours as needed for flatulence.    traZODone (DESYREL) 100 MG tablet Take 100 mg by mouth at bedtime.    venlafaxine XR (EFFEXOR-XR) 150 MG 24 hr capsule Take 150 mg by mouth daily.    gabapentin (NEURONTIN) 100 MG capsule Take 100 mg by mouth 2 (two) times daily.      STOP taking these medications     aspirin EC 81 MG tablet      diclofenac sodium (VOLTAREN) 1 % GEL        magnesium oxide (MAG-OX) 400 MG tablet          DISCHARGE INSTRUCTIONS:    The patient is to follow-up with primary care physician as outpatient  If you experience worsening of your admission symptoms, develop shortness of breath, life threatening emergency, suicidal or homicidal thoughts you must seek medical attention immediately by calling 911 or calling your MD immediately  if symptoms less severe.  You Must read complete instructions/literature along with all the possible adverse reactions/side effects for all the Medicines you take and that have been prescribed to you. Take any new Medicines after you have completely understood and accept all the possible adverse reactions/side effects.   Please note  You were cared for by a hospitalist during your hospital stay. If you have any questions about your discharge medications or the care you received while you were in the hospital after you are discharged, you can call the unit and asked to speak with the hospitalist on call if the hospitalist that took care of you is not available. Once you are discharged, your primary  care physician will handle any further medical issues. Please note that NO REFILLS for any discharge medications will be authorized once you are discharged, as it is imperative that you return to your primary care physician (or establish a relationship with a primary care physician if you do not have one) for your aftercare needs so that they can reassess your need for medications and monitor your lab values.    Today   CHIEF COMPLAINT:   Chief Complaint  Patient presents with  . Weakness    HISTORY OF PRESENT ILLNESS:    VITAL SIGNS:  Blood pressure (!) 122/56, pulse 84, temperature 98.3 F (36.8 C), temperature source Oral, resp. rate 20, height 5\' 7"  (1.702 m), weight 123.9 kg (273 lb 1.6 oz), SpO2 98 %.  I/O:    Intake/Output Summary (Last 24 hours) at 05/05/17 1658 Last data filed at 05/05/17  1500  Gross per 24 hour  Intake          3179.87 ml  Output                0 ml  Net          3179.87 ml    PHYSICAL EXAMINATION:  GENERAL:  66 y.o.-year-old patient lying in the bed with no acute distress.  EYES: Pupils equal, round, reactive to light and accommodation. No scleral icterus. Extraocular muscles intact.  HEENT: Head atraumatic, normocephalic. Oropharynx and nasopharynx clear.  NECK:  Supple, no jugular venous distention. No thyroid enlargement, no tenderness.  LUNGS: Normal breath sounds bilaterally, no wheezing, rales,rhonchi or crepitation. No use of accessory muscles of respiration.  CARDIOVASCULAR: S1, S2 normal. No murmurs, rubs, or gallops.  ABDOMEN: Soft, non-tender, non-distended. Bowel sounds present. No organomegaly or mass.  EXTREMITIES: No pedal edema, cyanosis, or clubbing.  NEUROLOGIC: Cranial nerves II through XII are intact. Muscle strength 5/5 in all extremities. Sensation intact. Gait not checked.  PSYCHIATRIC: The patient is alert and oriented x 3.  SKIN: No obvious rash, lesion, or ulcer.   DATA REVIEW:   CBC  Recent Labs Lab 05/05/17 0336  WBC 14.1*  HGB 8.1*  HCT 23.9*  PLT 220    Chemistries   Recent Labs Lab 05/03/17 0158  05/05/17 0336  NA 134*  < > 139  K 4.0  < > 3.8  CL 100*  < > 106  CO2 26  < > 26  GLUCOSE 177*  < > 145*  BUN 43*  < > 19  CREATININE 1.29*  < > 0.95  CALCIUM 8.4*  < > 7.4*  AST 23  --   --   ALT 15  --   --   ALKPHOS 74  --   --   BILITOT 0.5  --   --   < > = values in this interval not displayed.  Cardiac Enzymes  Recent Labs Lab 05/03/17 0158  TROPONINI <0.03    Microbiology Results  Results for orders placed or performed during the hospital encounter of 05/03/17  Gastrointestinal Panel by PCR , Stool     Status: None   Collection Time: 05/03/17  4:22 PM  Result Value Ref Range Status   Campylobacter species NOT DETECTED NOT DETECTED Final   Plesimonas shigelloides NOT DETECTED NOT  DETECTED Final   Salmonella species NOT DETECTED NOT DETECTED Final   Yersinia enterocolitica NOT DETECTED NOT DETECTED Final   Vibrio species NOT DETECTED NOT DETECTED Final   Vibrio cholerae NOT DETECTED NOT DETECTED Final  Enteroaggregative E coli (EAEC) NOT DETECTED NOT DETECTED Final   Enteropathogenic E coli (EPEC) NOT DETECTED NOT DETECTED Final   Enterotoxigenic E coli (ETEC) NOT DETECTED NOT DETECTED Final   Shiga like toxin producing E coli (STEC) NOT DETECTED NOT DETECTED Final   Shigella/Enteroinvasive E coli (EIEC) NOT DETECTED NOT DETECTED Final   Cryptosporidium NOT DETECTED NOT DETECTED Final   Cyclospora cayetanensis NOT DETECTED NOT DETECTED Final   Entamoeba histolytica NOT DETECTED NOT DETECTED Final   Giardia lamblia NOT DETECTED NOT DETECTED Final   Adenovirus F40/41 NOT DETECTED NOT DETECTED Final   Astrovirus NOT DETECTED NOT DETECTED Final   Norovirus GI/GII NOT DETECTED NOT DETECTED Final   Rotavirus A NOT DETECTED NOT DETECTED Final   Sapovirus (I, II, IV, and V) NOT DETECTED NOT DETECTED Final  C difficile quick scan w PCR reflex     Status: None   Collection Time: 05/03/17  4:22 PM  Result Value Ref Range Status   C Diff antigen NEGATIVE NEGATIVE Final   C Diff toxin NEGATIVE NEGATIVE Final   C Diff interpretation No C. difficile detected.  Final    RADIOLOGY:  No results found.  EKG:   Orders placed or performed during the hospital encounter of 05/03/17  . EKG 12-Lead  . EKG 12-Lead      Management plans discussed with the patient, family and they are in agreement.  CODE STATUS:     Code Status Orders        Start     Ordered   05/03/17 0526  Full code  Continuous     05/03/17 0525    Code Status History    Date Active Date Inactive Code Status Order ID Comments User Context   04/28/2017  3:02 PM 04/30/2017  7:04 PM Full Code [id number]  Altamese Dilling, MD Inpatient    Advance Directive Documentation     Most Recent Value   Type of Advance Directive  Healthcare Power of Attorney  Pre-existing out of facility DNR order (yellow form or pink MOST form)  -  "MOST" Form in Place?  -      TOTAL TIME TAKING CARE OF THIS PATIENT: 40 minutes.    Katharina Caper M.D on 05/05/2017 at 4:58 PM  Between 7am to 6pm - Pager - [phone]  After 6pm go to [email or web address] - password [email or web address] [facility]  [facility]  Office  [phone]  CC: Primary care physician; Clovis Pu, Arturo Morton, DO

## 2017-05-05 NOTE — Progress Notes (Signed)
[facility] - [place] at [facility]   PATIENT NAME: Jacqueline Williams    MR#:  [id number]  DATE OF BIRTH:  April 19, 1951  SUBJECTIVE:  CHIEF COMPLAINT:   Chief Complaint  Patient presents with  . Weakness   The patient is 66 year old Caucasian female with past medical history significant for history of chronic back pain, hypothyroidism, who presented to the hospital with complaints of generalized weakness and fatigue, multiple episodes of rectal bleeding. She admitted of back pain as well as chest pain. On arrival to emergency room, her hemoglobin level was found to be 5.3. Apparently, patient had colonoscopy few days ago, which revealed hemorrhoids. No nausea, vomiting. No hematemesis was noted. Patient was admitted to the hospital for further evaluation, treatment, she was transfused 3 units of  packed red blood cells after which hemoglobin level improved to 8.1 today. She feels overall better , no more bleeding, no diarrheal stool. Patient continues to have some diarrheal stool, stool cultures were negative, including C. difficile. She underwent EGD 05/03/2017. No more bleeding, which was unremarkable. Gastroenterologist recommended packed red blood cell scan if bleeding recurs.   Review of Systems  Constitutional: Negative for chills, fever and weight loss.  HENT: Negative for congestion.   Eyes: Negative for blurred vision and double vision.  Respiratory: Negative for cough, sputum production, shortness of breath and wheezing.   Cardiovascular: Positive for chest pain. Negative for palpitations, orthopnea, leg swelling and PND.  Gastrointestinal: Positive for blood in stool. Negative for abdominal pain, constipation, diarrhea, nausea and vomiting.  Genitourinary: Negative for dysuria, frequency, hematuria and urgency.  Musculoskeletal: Positive for back pain. Negative for falls.  Neurological: Positive for weakness. Negative for dizziness, tremors, focal weakness  and headaches.  Endo/Heme/Allergies: Does not bruise/bleed easily.  Psychiatric/Behavioral: Negative for depression. The patient does not have insomnia.     VITAL SIGNS: Blood pressure (!) 128/47, pulse 95, temperature 98.1 F (36.7 C), temperature source Oral, resp. rate 20, height 5\' 7"  (1.702 m), weight 123.9 kg (273 lb 1.6 oz), SpO2 97 %.  PHYSICAL EXAMINATION:   GENERAL:  66 y.o.-year-old pale white patient lying in the bed with no acute distress.  EYES: Pupils equal, round, reactive to light and accommodation. No scleral icterus. Extraocular muscles intact.  HEENT: Head atraumatic, normocephalic. Oropharynx and nasopharynx clear.  NECK:  Supple, no jugular venous distention. No thyroid enlargement, no tenderness.  LUNGS: Normal breath sounds bilaterally, no wheezing, rales,rhonchi or crepitation. No use of accessory muscles of respiration.  CARDIOVASCULAR: S1, S2 normal. No murmurs, rubs, or gallops.  ABDOMEN: Soft, nontender, nondistended. Bowel sounds present. No organomegaly or mass.  EXTREMITIES: No pedal edema, cyanosis, or clubbing.  NEUROLOGIC: Cranial nerves II through XII are intact. Muscle strength 5/5 in all extremities. Sensation intact. Gait not checked.  PSYCHIATRIC: The patient is alert and oriented x 3.  SKIN: No obvious rash, lesion, or ulcer.   ORDERS/RESULTS REVIEWED:   CBC  Recent Labs Lab 04/29/17 0740  04/30/17 4098 05/03/17 0158  05/03/17 2124 05/04/17 0342 05/04/17 1421 05/04/17 1917 05/05/17 0336  WBC 12.2*  --  12.0* 18.7*  --   --  14.1*  --   --  14.1*  HGB 10.0*  < > 9.2* 5.3*  < > 7.2* 6.9* 7.2* 8.8* 8.1*  HCT 29.3*  < > 26.5* 15.6*  < > 21.9* 20.2* 21.4* 25.9* 23.9*  PLT 305  --  299 290  --   --  221  --   --  220  MCV 90.6  --  91.3 93.8  --   --  94.4  --   --  92.8  MCH 30.9  --  31.6 32.1  --   --  32.2  --   --  31.6  MCHC 34.1  --  34.6 34.2  --   --  34.1  --   --  34.0  RDW 16.1*  --  16.2* 15.5*  --   --  15.9*  --   --  16.4*    < > = values in this interval not displayed. ------------------------------------------------------------------------------------------------------------------  Chemistries   Recent Labs Lab 04/30/17 0639 05/03/17 0158 05/03/17 0949 05/04/17 0342 05/05/17 0336  NA 141 134* 135 139 139  K 4.1 4.0 4.3 3.9 3.8  CL 106 100* 102 106 106  CO2 29 26 24 27 26   GLUCOSE 118* 177* 130* 112* 145*  BUN 13 43* 37* 26* 19  CREATININE 0.92 1.29* 1.14* 1.06* 0.95  CALCIUM 8.3* 8.4* 8.5* 7.6* 7.4*  AST  --  23  --   --   --   ALT  --  15  --   --   --   ALKPHOS  --  74  --   --   --   BILITOT  --  0.5  --   --   --    ------------------------------------------------------------------------------------------------------------------ estimated creatinine clearance is 82.8 mL/min (by C-G formula based on SCr of 0.95 mg/dL). ------------------------------------------------------------------------------------------------------------------ No results for input(s): TSH, T4TOTAL, T3FREE, THYROIDAB in the last 72 hours.  Invalid input(s): FREET3  Cardiac Enzymes  Recent Labs Lab 05/03/17 0158  TROPONINI <0.03   ------------------------------------------------------------------------------------------------------------------ Invalid input(s): POCBNP ---------------------------------------------------------------------------------------------------------------  RADIOLOGY: No results found.  EKG:  Orders placed or performed during the hospital encounter of 05/03/17  . EKG 12-Lead  . EKG 12-Lead    ASSESSMENT AND PLAN:  Active Problems:   GI bleed  #1. Acute post hemorrhagic anemia,Status post 3 units of packed red blood cell transfusion during this admission, hemoglobin level remains stable,  Follow closely, no more bleeding, blood pressure is good, advancing diet, possible discharge home today if no more bleeding, stop aspirin therapy until he recommended to restart her primary care  physician or gastroenterologist  #2. Gastrointestinal bleed, status post recent colonoscopy, status post EGD 05/03/2017, unremarkable, appreciate gastroenterology's input, recommended tagged red blood cell scan if bleeding recurs. Advance diet to soft, possible discharge home in no significant changes #3 Hyponatremia, resolved with IV fluid administration, likely dehydration related  #4 acute renal insufficiency, , resolved with transfusion, IV fluids, , follow creatias outpatient due to dehydration/ongoing diarrhea #5 Leukocytosis, etiology is unclear, improving overall, follow CBC as out patient. The patient is afebrile, not on antibiotic therapy at this time. C. difficile and stool cultures are ordered , negative  #6 diarrhea, still ongoing, difficile and stool PCR   were negative, supportive therapy with Imodium, IV fluids, patient was advised to advance diet, liquid intake   Management plans discussed with the patient, family and they are in agreement.   DRUG ALLERGIES:  Allergies  Allergen Reactions  . Other Shortness Of Breath    caffergot  . Penicillins   . Seroquel [Quetiapine Fumarate] Other (See Comments)    hallucinations    CODE STATUS:     Code Status Orders        Start     Ordered   05/03/17 0526  Full code  Continuous     05/03/17 0525  Code Status History    Date Active Date Inactive Code Status Order ID Comments User Context   04/28/2017  3:02 PM 04/30/2017  7:04 PM Full Code [id number]  Altamese Dilling, MD Inpatient    Advance Directive Documentation     Most Recent Value  Type of Advance Directive  Healthcare Power of Attorney  Pre-existing out of facility DNR order (yellow form or pink MOST form)  -  "MOST" Form in Place?  -      TOTAL TIME TAKING CARE OF THIS PATIENT: 40 minutes.    Katharina Caper M.D on 05/05/2017 at 1:09 PM  Between 7am to 6pm - Pager - [phone]  After 6pm go to [email or web address] - password [email or web address] [facility]  [place]  Hospitalists  Office  [phone]  CC: Primary care physician; Clovis Pu, Arturo Morton, DO

## 2017-05-06 ENCOUNTER — Inpatient Hospital Stay: Payer: Medicare PPO

## 2017-05-06 ENCOUNTER — Encounter: Payer: Self-pay | Admitting: Radiology

## 2017-05-06 DIAGNOSIS — D649 Anemia, unspecified: Secondary | ICD-10-CM

## 2017-05-06 DIAGNOSIS — D62 Acute posthemorrhagic anemia: Secondary | ICD-10-CM

## 2017-05-06 LAB — MRSA PCR SCREENING: MRSA by PCR: NEGATIVE

## 2017-05-06 LAB — PREPARE RBC (CROSSMATCH)

## 2017-05-06 LAB — GLUCOSE, CAPILLARY: Glucose-Capillary: 115 mg/dL — ABNORMAL HIGH (ref 65–99)

## 2017-05-06 LAB — HEMOGLOBIN AND HEMATOCRIT, BLOOD
HCT: 15.5 % — ABNORMAL LOW (ref 35.0–47.0)
Hemoglobin: 5.2 g/dL — ABNORMAL LOW (ref 12.0–16.0)

## 2017-05-06 LAB — HEMOGLOBIN: Hemoglobin: 5.6 g/dL — ABNORMAL LOW (ref 12.0–16.0)

## 2017-05-06 MED ORDER — SODIUM CHLORIDE 0.9 % IV SOLN
8.0000 mg/h | INTRAVENOUS | Status: AC
  Administered 2017-05-06 – 2017-05-08 (×6): 8 mg/h via INTRAVENOUS
  Filled 2017-05-06 (×9): qty 80

## 2017-05-06 MED ORDER — LOPERAMIDE HCL 2 MG PO CAPS
2.0000 mg | ORAL_CAPSULE | ORAL | Status: DC | PRN
  Administered 2017-05-09 – 2017-05-12 (×7): 2 mg via ORAL
  Filled 2017-05-06 (×7): qty 1

## 2017-05-06 MED ORDER — SODIUM CHLORIDE 0.9 % IV SOLN: INTRAVENOUS | Status: DC

## 2017-05-06 MED ORDER — TECHNETIUM TC 99M-LABELED RED BLOOD CELLS IV KIT
25.0000 | PACK | Freq: Once | INTRAVENOUS | Status: AC | PRN
  Administered 2017-05-06: 22.812 via INTRAVENOUS

## 2017-05-06 MED ORDER — SODIUM CHLORIDE 0.9 % IV SOLN
Freq: Once | INTRAVENOUS | Status: AC
  Administered 2017-05-06: 14:00:00 via INTRAVENOUS

## 2017-05-06 MED ORDER — SODIUM CHLORIDE 0.9 % IV SOLN
Freq: Once | INTRAVENOUS | Status: AC
  Administered 2017-05-06: 11:00:00 via INTRAVENOUS

## 2017-05-06 MED ORDER — MAGNESIUM CITRATE PO SOLN
1.0000 | Freq: Once | ORAL | Status: AC
  Administered 2017-05-07: 1 via ORAL
  Filled 2017-05-06: qty 296

## 2017-05-06 MED ORDER — SODIUM CHLORIDE 0.9 % IV BOLUS (SEPSIS)
1000.0000 mL | Freq: Once | INTRAVENOUS | Status: AC
  Administered 2017-05-06: 1000 mL via INTRAVENOUS

## 2017-05-06 MED ORDER — SODIUM CHLORIDE 0.9 % IV SOLN
Freq: Once | INTRAVENOUS | Status: AC
  Administered 2017-05-06: 18:00:00 via INTRAVENOUS

## 2017-05-06 NOTE — Progress Notes (Signed)
Per Dr. Winona LegatoVaickute administer current unit of blood infusing over 2 hours.   Rate changed accordingly after 15 minutes.

## 2017-05-06 NOTE — Progress Notes (Signed)
Jacqueline MoodKiran Daisa Stennis MD [address] [place], [place] [address] Phone: [phone] Fax : [phone]  Jacqueline Williams is being followed for GI bleed   Subjective:  Black tarry stools over night with clots   Objective: Vital signs in last 24 hours: Vitals:   05/05/17 0414 05/05/17 1402 05/05/17 2010 05/06/17 0608  BP: (!) 128/47 (!) 122/56 122/61 124/70  Pulse: 95 84 99 (!) 105  Resp: 20  20 20   Temp: 98.1 F (36.7 C) 98.3 F (36.8 C) 97.7 F (36.5 C) 97.6 F (36.4 C)  TempSrc: Oral Oral Oral Oral  SpO2: 97% 98% 94% 95%  Weight:      Height:       Weight change:   Intake/Output Summary (Last 24 hours) at 05/06/17 [id number] Last data filed at 05/06/17 [id number]  Gross per 24 hour  Intake             3824 ml  Output                0 ml  Net             3824 ml     Exam: Appears very pale  Heart:: Regular rate and rhythm, S1S2 present or without murmur or extra heart sounds Lungs: normal, clear to auscultation and clear to auscultation and percussion Abdomen: soft, nontender, normal bowel sounds   Lab Results: CBC Latest Ref Rng & Units 05/06/2017 05/05/2017 05/05/2017  WBC 3.6 - 11.0 K/uL - - 14.1(H)  Hemoglobin 12.0 - 16.0 g/dL 2.5(D5.6(L) 7.3(L) 8.1(L)  Hematocrit 35.0 - 47.0 % - - 23.9(L)  Platelets 150 - 440 K/uL - - 220    Micro Results: Recent Results (from the past 240 hour(s))  MRSA PCR Screening     Status: None   Collection Time: 04/28/17  5:01 PM  Result Value Ref Range Status   MRSA by PCR NEGATIVE NEGATIVE Final    Comment:        The GeneXpert MRSA Assay (FDA approved for NASAL specimens only), is one component of a comprehensive MRSA colonization surveillance program. It is not intended to diagnose MRSA infection nor to guide or monitor treatment for MRSA infections.   Gastrointestinal Panel by PCR , Stool     Status: None   Collection Time: 05/03/17  4:22 PM  Result Value Ref Range Status   Campylobacter species NOT DETECTED NOT DETECTED Final   Plesimonas shigelloides NOT DETECTED NOT DETECTED Final   Salmonella species NOT DETECTED NOT DETECTED Final   Yersinia enterocolitica NOT DETECTED NOT DETECTED Final   Vibrio species NOT DETECTED NOT DETECTED Final   Vibrio cholerae NOT DETECTED NOT DETECTED Final   Enteroaggregative E coli (EAEC) NOT DETECTED NOT DETECTED Final   Enteropathogenic E coli (EPEC) NOT DETECTED NOT DETECTED Final   Enterotoxigenic E coli (ETEC) NOT DETECTED NOT DETECTED Final   Shiga like toxin producing E coli (STEC) NOT DETECTED NOT DETECTED Final   Shigella/Enteroinvasive E coli (EIEC) NOT DETECTED NOT DETECTED Final   Cryptosporidium NOT DETECTED NOT DETECTED Final   Cyclospora cayetanensis NOT DETECTED NOT DETECTED Final   Entamoeba histolytica NOT DETECTED NOT DETECTED Final   Giardia lamblia NOT DETECTED NOT DETECTED Final   Adenovirus F40/41 NOT DETECTED NOT DETECTED Final   Astrovirus NOT DETECTED NOT DETECTED Final   Norovirus GI/GII NOT DETECTED NOT DETECTED Final   Rotavirus A NOT DETECTED NOT DETECTED Final   Sapovirus (I, II, IV, and V) NOT DETECTED NOT DETECTED Final  C difficile quick scan w PCR reflex     Status: None   Collection Time: 05/03/17  4:22 PM  Result Value Ref Range Status   C Diff antigen NEGATIVE NEGATIVE Final   C Diff toxin NEGATIVE NEGATIVE Final   C Diff interpretation No C. difficile detected.  Final   Studies/Results: No results found. Medications: I have reviewed the patient's current medications. Scheduled Meds: . ferrous gluconate  324 mg Oral Q breakfast  . levothyroxine  25 mcg Oral QAC breakfast  . magnesium oxide  400 mg Oral Daily  . oxybutynin  5 mg Oral BID  . risperiDONE  4 mg Oral QHS  . traZODone  100 mg Oral QHS  . venlafaxine XR  150 mg Oral QHS   Continuous Infusions: . sodium chloride 75 mL/hr at 05/06/17 0346  . sodium chloride 20 mL/hr at 05/04/17 1542  . sodium chloride Stopped (05/03/17 1834)  . sodium chloride     PRN  Meds:.acetaminophen **OR** acetaminophen, fluticasone, loperamide, LORazepam, morphine injection, ondansetron **OR** ondansetron (ZOFRAN) IV   Assessment: Active Problems:   GI bleed   Acute posthemorrhagic anemia   History of recent blood transfusion   Hyponatremia   Dehydration   Acute renal insufficiency   Leukocytosis   Diarrhea  Jacqueline Williams is a 66 y.o. female was admitted initially on 04/28/17-04/30/17 and I was consulted to see her and at that time I felt she had a diverticular bleed and it had stopped at the time of her colonoscopy on  04/30/18 showed non bleeding diverticuli. She was discharged and again admitted on  05/03/17 and was seen by GI Dr Orvan Falconer for rectal bleeding who then performed an EGD on 05/03/17 and during the hospitalization she had no active bleeding and didn't have a bowel movement either .   She was in the process of being discharged when last night was noted to have a drop in Hb last night and Dr Winona Legato called me to discuss her care   Plan: 1. Monitor CBC and transfuse as needed  2. Tagged RBC scan and if negative will plan for Capsule study tomorrow.    LOS: 3 days   Jacqueline Williams 05/06/2017, 9:07 AM

## 2017-05-06 NOTE — Progress Notes (Signed)
Spoke with Dr. Winona LegatoVaickute regarding result of patient's hemoglobin and hematocrit.  Per Dr. Winona LegatoVaickute will give bolus followed by 3 units blood.  Transfer to stepdown.  Continue to monitor.

## 2017-05-06 NOTE — Plan of Care (Signed)
Problem: Bowel/Gastric: Goal: Will show no signs and symptoms of gastrointestinal bleeding Outcome: Not Progressing Patient has had episode of rectal bleeding this a.m.  MD aware; continue to monitor and follow current orders.  Problem: Education: Goal: Ability to identify signs and symptoms of gastrointestinal bleeding will improve Outcome: Progressing Patient aware to notify RN of any episodes of bloody stools or vomit.

## 2017-05-06 NOTE — Progress Notes (Signed)
[facility] - [place] at [facility]   PATIENT NAME: Jacqueline Williams    MR#:  [id number]  DATE OF BIRTH:  May 28, 1951  SUBJECTIVE:  CHIEF COMPLAINT:   Chief Complaint  Patient presents with  . Weakness   The patient is 66 year old Caucasian female with past medical history significant for history of chronic back pain, hypothyroidism, who presented to the hospital with complaints of generalized weakness and fatigue, multiple episodes of rectal bleeding. She admitted of back pain as well as chest pain. On arrival to emergency room, her hemoglobin level was found to be 5.3. Apparently, patient had colonoscopy few days ago, which revealed hemorrhoids. No nausea, vomiting. No hematemesis was noted. Patient was admitted to the hospital for further evaluation, treatment, she was transfused 3 units of  packed red blood cells after which hemoglobin level improved to 8.1 today. She feels overall better , no more bleeding, no diarrheal stool. Patient continues to have some diarrheal stool, stool cultures were negative, including C. difficile. She underwent EGD 05/03/2017. The patient was initiated on soft diet, however, that rebled, had black stool and the hemoglobin level dropped down significantly earlier today. Tachycardic, but not hypotensive, is receiving blood transfusion. She underwent Physical Bleeding Scan, Which Was Negative. I Discussed Patient's Case with Dr. Tobi Bastos, Who Recommended capsule endoscopy. Patient had numerous black bowel movements yesterday and bright red blood with clots, dark blood per rectum Today.   Review of Systems  Constitutional: Negative for chills, fever and weight loss.  HENT: Negative for congestion.   Eyes: Negative for blurred vision and double vision.  Respiratory: Negative for cough, sputum production, shortness of breath and wheezing.   Cardiovascular: Positive for chest pain. Negative for palpitations, orthopnea, leg swelling and PND.    Gastrointestinal: Positive for blood in stool and melena. Negative for abdominal pain, constipation, diarrhea, nausea and vomiting.  Genitourinary: Negative for dysuria, frequency, hematuria and urgency.  Musculoskeletal: Positive for back pain. Negative for falls.  Neurological: Positive for weakness. Negative for dizziness, tremors, focal weakness and headaches.  Endo/Heme/Allergies: Does not bruise/bleed easily.  Psychiatric/Behavioral: Negative for depression. The patient does not have insomnia.     VITAL SIGNS: Blood pressure (!) 151/61, pulse (!) 104, temperature 99.5 F (37.5 C), temperature source Axillary, resp. rate 16, height 5\' 7"  (1.702 m), weight 123.9 kg (273 lb 1.6 oz), SpO2 91 %.  PHYSICAL EXAMINATION:   GENERAL:  66 y.o.-year-old pale white patient lying in the bed with no acute distress. Dry oral mucosa EYES: Pupils equal, round, reactive to light and accommodation. No scleral icterus. Extraocular muscles intact.  HEENT: Head atraumatic, normocephalic. Oropharynx and nasopharynx clear.  NECK:  Supple, no jugular venous distention. No thyroid enlargement, no tenderness.  LUNGS: Normal breath sounds bilaterally, no wheezing, rales,rhonchi or crepitation. No use of accessory muscles of respiration.  CARDIOVASCULAR: S1, S2 normal. No murmurs, rubs, or gallops.  ABDOMEN: Soft, nontender, nondistended. Bowel sounds present. No organomegaly or mass.  EXTREMITIES: No pedal edema, cyanosis, or clubbing.  NEUROLOGIC: Cranial nerves II through XII are intact. Muscle strength 5/5 in all extremities. Sensation intact. Gait not checked.  PSYCHIATRIC: The patient is alert and oriented x 3.  SKIN: No obvious rash, lesion, or ulcer.   ORDERS/RESULTS REVIEWED:   CBC  Recent Labs Lab 04/30/17 0639 05/03/17 0158  05/03/17 2124 05/04/17 0342 05/04/17 1421 05/04/17 1917 05/05/17 0336 05/05/17 1702 05/06/17 0841  WBC 12.0* 18.7*  --   --  14.1*  --   --  14.1*  --   --   HGB  9.2* 5.3*  < > 7.2* 6.9* 7.2* 8.8* 8.1* 7.3* 5.6*  HCT 26.5* 15.6*  < > 21.9* 20.2* 21.4* 25.9* 23.9*  --   --   PLT 299 290  --   --  221  --   --  220  --   --   MCV 91.3 93.8  --   --  94.4  --   --  92.8  --   --   MCH 31.6 32.1  --   --  32.2  --   --  31.6  --   --   MCHC 34.6 34.2  --   --  34.1  --   --  34.0  --   --   RDW 16.2* 15.5*  --   --  15.9*  --   --  16.4*  --   --   < > = values in this interval not displayed. ------------------------------------------------------------------------------------------------------------------  Chemistries   Recent Labs Lab 04/30/17 0639 05/03/17 0158 05/03/17 0949 05/04/17 0342 05/05/17 0336  NA 141 134* 135 139 139  K 4.1 4.0 4.3 3.9 3.8  CL 106 100* 102 106 106  CO2 29 26 24 27 26   GLUCOSE 118* 177* 130* 112* 145*  BUN 13 43* 37* 26* 19  CREATININE 0.92 1.29* 1.14* 1.06* 0.95  CALCIUM 8.3* 8.4* 8.5* 7.6* 7.4*  AST  --  23  --   --   --   ALT  --  15  --   --   --   ALKPHOS  --  74  --   --   --   BILITOT  --  0.5  --   --   --    ------------------------------------------------------------------------------------------------------------------ estimated creatinine clearance is 82.8 mL/min (by C-G formula based on SCr of 0.95 mg/dL). ------------------------------------------------------------------------------------------------------------------ No results for input(s): TSH, T4TOTAL, T3FREE, THYROIDAB in the last 72 hours.  Invalid input(s): FREET3  Cardiac Enzymes  Recent Labs Lab 05/03/17 0158  TROPONINI <0.03   ------------------------------------------------------------------------------------------------------------------ Invalid input(s): POCBNP ---------------------------------------------------------------------------------------------------------------  RADIOLOGY: Nm Gi Blood Loss  Result Date: 05/06/2017 CLINICAL DATA:  Read bloody stool episodes for 1 week, colonoscopy 04/30/2017, upper endoscopy  05/03/2017, acute post hemorrhagic anemia, patient receiving blood during scan. EXAM: NUCLEAR MEDICINE GASTROINTESTINAL BLEEDING SCAN TECHNIQUE: Sequential abdominal images were obtained following intravenous administration of Tc-94m labeled red blood cells. RADIOPHARMACEUTICALS:  22.8 mCi Tc-51m in-vitro labeled red cells. COMPARISON:  None. FINDINGS: No abnormal radiotracer identified that would confirm active GI bleed. IMPRESSION: No active GI bleeding site identified. Electronically Signed   By: Bary Richard M.D.   On: 05/06/2017 14:31    EKG:  Orders placed or performed during the hospital encounter of 05/03/17  . EKG 12-Lead  . EKG 12-Lead    ASSESSMENT AND PLAN:  Active Problems:   GI bleed   Acute posthemorrhagic anemia   History of recent blood transfusion   Hyponatremia   Dehydration   Acute renal insufficiency   Leukocytosis   Diarrhea  #1. Acute post hemorrhagic anemia, Status post 3 units of packed red blood cell transfusion during this admission, transfuse 3 more units today, follow hemoglobin level  closely, gastrointestinal bleeding scan was negative, blood pressure remains good, patient will be nothing by mouth, discussed this patient. Primary gastro-neurologist, Dr. Tobi Bastos, likely capsule endoscopy during This Admission since All Studies Are Negative so Far.   #2. Gastrointestinal bleed, acute, black and bright red blood, status  post recent colonoscopy which revealed small internal nonbleeding hemorrhoids, status post EGD 05/03/2017, unremarkable, appreciate gastroenterology's input, discussed again with Dr. Tobi BastosAnna and Dr. Servando SnareWohl, tagged red blood cell scan was done today, negative, likely capsule endoscopy during this admission #3 Hyponatremia, resolved with IV fluid administration, likely intravascular depletion related , follow sodium level tomorrow morning #4 acute renal insufficiency, , resolved with transfusion, IV fluids, , follow creatinine due to ongoing bleed,  tachycardia, intravascular depletion, diarrhea  #5 Leukocytosis, etiology is unclear,  follow CBC in the morning. The patient is afebrile, not on antibiotic therapy at this time. C. difficile and stool cultures  were  negative  #6 diarrhea, still ongoing, C. difficile and stool gastrointestinal panel were negative, supportive therapy with Imodium, IV fluids, the patient is nothing by mouth, suspect melena related  Management plans discussed with the patient, family and they are in agreement.   DRUG ALLERGIES:  Allergies  Allergen Reactions  . Other Shortness Of Breath    caffergot  . Penicillins   . Seroquel [Quetiapine Fumarate] Other (See Comments)    hallucinations    CODE STATUS:     Code Status Orders        Start     Ordered   05/03/17 0526  Full code  Continuous     05/03/17 0525    Code Status History    Date Active Date Inactive Code Status Order ID Comments User Context   04/28/2017  3:02 PM 04/30/2017  7:04 PM Full Code [id number]  Altamese DillingVachhani, Vaibhavkumar, MD Inpatient    Advance Directive Documentation     Most Recent Value  Type of Advance Directive  Healthcare Power of Attorney  Pre-existing out of facility DNR order (yellow form or pink MOST form)  -  "MOST" Form in Place?  -      TOTAL Critical care TIME TAKING CARE OF THIS PATIENT: 45 minutes.  Discussed with Dr. Servando SnareWohl and Dr. Ardis HughsAnna  Joeziah Voit M.D on 05/06/2017 at 3:02 PM  Between 7am to 6pm - Pager - [phone]  After 6pm go to [email or web address] - password [email or web address] [facility]  [place] [facility]  Office  [phone]  CC: Primary care physician; Clovis PuVan Horn, Arturo MortonJill E, DO

## 2017-05-06 NOTE — Progress Notes (Signed)
Tagged RBC scan negative  Will schedule capsule study for tomorrow   Dr Wyline MoodKiran Chelci [place]  Gastroenterology/Hepatology Pager: [phone]

## 2017-05-06 NOTE — Progress Notes (Signed)
Name: Jacqueline Williams MRN: [id number] DOB: [birth year]    ADMISSION DATE:  05/03/2017  CHIEF COMPLAINT: Weakness  BRIEF PATIENT DESCRIPTION: 66 yo female with Acute blood loss anemia possibly related to GI bleed. Plan for Capsule study tomorrow.  SIGNIFICANT EVENTS : 5/7 -5/9>> Patient admitted with concerns of Diverticular bleed. 5/9 >> Colonoscopy showed non bleeding diverticuli. 5/12>> EGD, no active bleding 5/15>> RBC scan negative 5/16 >> Capsule study  STUDIES:  04/30/17 Colonoscopy>>showed non bleeding diverticuli.   HISTORY OF PRESENT ILLNESS:  Jacqueline Williams is a 66 year old female with known history of chronic back pain, hypothyroidism and depression. Patient was admitted initially from 5/7 -5/9 with concerns of diverticular bleed which stopped at the time of her colonoscopy on 04/30/18, showed non bleeding diverticuli per the gastroenterologists. She was discharged and readmitted on 5/12 and underwent an EGD which was non conclusive.  She also Tagged RBC scan which was negative. She had no active bleeding or a bowel movement this admission.  PAST MEDICAL HISTORY :   has a past medical history of Back pain; Depression; and Thyroid disease.  has a past surgical history that includes Hernia repair; Cholecystectomy; Abdominal hysterectomy; gastric bipass; Colonoscopy with propofol (N/A, 04/30/2017); and Esophagogastroduodenoscopy (egd) with propofol (N/A, 05/03/2017). Prior to Admission medications   Medication Sig Start Date End Date Taking? Authorizing Provider  acetaminophen (TYLENOL) 500 MG tablet Take 500 mg by mouth every 6 (six) hours as needed.   Yes [provider]  aspirin EC 81 MG tablet Take 81 mg by mouth daily.   Yes [provider]  calcium-vitamin D (OSCAL WITH D) 500-200 MG-UNIT tablet Take 1 tablet by mouth.   Yes [provider]  cetirizine (ZYRTEC) 10 MG tablet Take 10 mg by mouth daily.   Yes [provider]  cyanocobalamin  (,VITAMIN B-12,) 1000 MCG/ML injection Inject 1 mL into the skin every 30 (thirty) days. 04/16/17  Yes [provider]  diclofenac sodium (VOLTAREN) 1 % GEL Apply topically 4 (four) times daily. Prn arthritis pain   Yes [provider]  fluticasone (FLONASE) 50 MCG/ACT nasal spray Place 1 spray into both nostrils daily as needed for allergies or rhinitis.   Yes [provider]  lactase (LACTAID) 3000 units tablet Take by mouth 3 (three) times daily with meals.   Yes [provider]  levothyroxine (SYNTHROID, LEVOTHROID) 25 MCG tablet Take 25 mcg by mouth daily. 04/26/17  Yes [provider]  lipase/protease/amylase (CREON) 12000 units CPEP capsule Take 24,000 Units by mouth 3 (three) times daily before meals.   Yes [provider]  loperamide (IMODIUM) 2 MG capsule Take by mouth as needed for diarrhea or loose stools.   Yes [provider]  LORazepam (ATIVAN) 1 MG tablet Take 0.5-1 mg by mouth 4 (four) times daily. 0.5 mg 0630,  1 mg 1100, 0.5 mg 1600, 1 mg 2030 04/05/17  Yes [provider]  magnesium oxide (MAG-OX) 400 MG tablet Take 400 mg by mouth daily.   Yes [provider]  oxybutynin (DITROPAN) 5 MG tablet Take 5 mg by mouth 2 (two) times daily.   Yes [provider]  oxyCODONE-acetaminophen (PERCOCET) 5-325 MG tablet Take 1 tablet by mouth 4 (four) times daily. 0630,1100,1600,2030   Yes [provider]  pantoprazole (PROTONIX) 40 MG tablet Take 40 mg by mouth daily.   Yes [provider]  promethazine (PHENERGAN) 25 MG tablet Take 25 mg by mouth every 6 (six) hours  as needed. 04/28/17  Yes [provider]  risperidone (RISPERDAL) 4 MG tablet Take 4 mg by mouth daily. 03/11/17  Yes [provider]  simethicone (MYLICON) 80 MG chewable tablet Chew 80 mg by mouth every 6 (six) hours as needed for flatulence.   Yes [provider]  traZODone (DESYREL) 100 MG tablet  Take 100 mg by mouth at bedtime. 04/14/17  Yes [provider]  venlafaxine XR (EFFEXOR-XR) 150 MG 24 hr capsule Take 150 mg by mouth daily. 02/25/17  Yes [provider]  ferrous gluconate (FERGON) 324 MG tablet Take 1 tablet (324 mg total) by mouth 3 (three) times daily with meals. 05/05/17   Katharina CaperVaickute, Rima, MD  gabapentin (NEURONTIN) 100 MG capsule Take 100 mg by mouth 2 (two) times daily. 02/25/17   [provider]   Allergies  Allergen Reactions  . Other Shortness Of Breath    caffergot  . Penicillins   . Seroquel [Quetiapine Fumarate] Other (See Comments)    hallucinations    FAMILY HISTORY:  family history includes Lung cancer in her father. SOCIAL HISTORY:  reports that she has never smoked. She has never used smokeless tobacco. She reports that she does not drink alcohol or use drugs.  REVIEW OF SYSTEMS:   Constitutional: Negative for fever, chills, weight loss, malaise/fatigue and diaphoresis.  HENT: Negative for hearing loss, ear pain, nosebleeds, congestion, sore throat, neck pain, tinnitus and ear discharge.   Eyes: Negative for blurred vision, double vision, photophobia, pain, discharge and redness.  Respiratory: Negative for cough, hemoptysis, sputum production, shortness of breath, wheezing and stridor.   Cardiovascular: Negative for chest pain, palpitations, orthopnea, claudication, leg swelling and PND.  Gastrointestinal: Negative for heartburn, nausea, vomiting, abdominal pain, diarrhea, constipation, blood in stool and melena.  Genitourinary: Negative for dysuria, urgency, frequency, hematuria and flank pain.  Musculoskeletal: Negative for myalgias, back pain, joint pain and falls.  Skin: Negative for itching and rash.  Neurological: Negative for dizziness, tingling, tremors, sensory change, speech change, focal weakness, seizures, loss of consciousness, weakness and headaches.  Endo/Heme/Allergies: Negative for environmental allergies and  polydipsia. Does not bruise/bleed easily.  SUBJECTIVE:  Patient states that "she had been feeling weak"  VITAL SIGNS: Temp:  [97.6 F (36.4 C)-99.5 F (37.5 C)] 98.5 F (36.9 C) (05/15 2135) Pulse Rate:  [88-114] 88 (05/15 2100) Resp:  [16-32] 25 (05/15 2100) BP: (113-163)/(34-105) 131/54 (05/15 2135) SpO2:  [90 %-100 %] 100 % (05/15 2100)  PHYSICAL EXAMINATION: General:  Caucasian female, in no acute distress. Neuro:  Awake, alert, oriented HEENT:  AT,[place],No JVD Cardiovascular:  S1S2,Regular, no m/r/g Lungs:  Clear bilaterally, no wheezes,crackles, rhonchi noted Abdomen:  Soft, NT,ND Musculoskeletal:  No edema, cyanosis noted Skin:  Warm, dry and intact.   Recent Labs Lab 05/03/17 0949 05/04/17 0342 05/05/17 0336  NA 135 139 139  K 4.3 3.9 3.8  CL 102 106 106  CO2 24 27 26   BUN 37* 26* 19  CREATININE 1.14* 1.06* 0.95  GLUCOSE 130* 112* 145*    Recent Labs Lab 05/03/17 0158  05/04/17 0342  05/04/17 1917 05/05/17 0336 05/05/17 1702 05/06/17 0841 05/06/17 1625  HGB 5.3*  < > 6.9*  < > 8.8* 8.1* 7.3* 5.6* 5.2*  HCT 15.6*  < > 20.2*  < > 25.9* 23.9*  --   --  15.5*  WBC 18.7*  --  14.1*  --   --  14.1*  --   --   --   PLT 290  --  221  --   --  220  --   --   --   < > = values in this interval not displayed. Nm Gi Blood Loss  Result Date: 05/06/2017 CLINICAL DATA:  Read bloody stool episodes for 1 week, colonoscopy 04/30/2017, upper endoscopy 05/03/2017, acute post hemorrhagic anemia, patient receiving blood during scan. EXAM: NUCLEAR MEDICINE GASTROINTESTINAL BLEEDING SCAN TECHNIQUE: Sequential abdominal images were obtained following intravenous administration of Tc-64m labeled red blood cells. RADIOPHARMACEUTICALS:  22.8 mCi Tc-39m in-vitro labeled red cells. COMPARISON:  None. FINDINGS: No abnormal radiotracer identified that would confirm active GI bleed. IMPRESSION: No active GI bleeding site identified. Electronically Signed   By: Bary Richard M.D.   On:  05/06/2017 14:31    ASSESSMENT / PLAN:  Recurrent GI Blood Loss Anemia Leukocytosis Diarrhea  Plan Gastroenterology following the patient Capsule study 5/16 Protonix gtt RBC Scan negative C-diff negative Gastro intestinal panel negative. Trend H/H    Bincy Varughese,AG-ACNP Pulmonary and Critical Care Medicine [facility]   05/06/2017, 9:42 PM  Pt seen and examined with NP, agree with assessment, findings, plan. Currently the patient is awake, no particular complaints, abd soft/NT.  She has had extensive workup for anemia and GI bleeding as above. She had been stable, however her Hb dropped abrupty to mid 5 range, she is now being transfused. The patient is to undergo a capsule study today. She will be monitored and transfused in the ICU. Will continue to monitor hb and transfuse as needed. Gi recs noted.   Wells Guiles, M.D.  05/07/2017

## 2017-05-07 ENCOUNTER — Encounter
Admission: EM | Disposition: A | Discharge status: Home Health | Payer: Self-pay | Source: Home / Self Care | Attending: Internal Medicine

## 2017-05-07 DIAGNOSIS — D649 Anemia, unspecified: Secondary | ICD-10-CM

## 2017-05-07 DIAGNOSIS — K922 Gastrointestinal hemorrhage, unspecified: Secondary | ICD-10-CM

## 2017-05-07 HISTORY — PX: GIVENS CAPSULE STUDY: SHX5432

## 2017-05-07 LAB — TYPE AND SCREEN
ABO/RH(D): O POS
Antibody Screen: NEGATIVE
Unit division: 0
Unit division: 0
Unit division: 0
Unit division: 0
Unit division: 0
Unit division: 0
Unit division: 0
Unit division: 0

## 2017-05-07 LAB — BASIC METABOLIC PANEL
Anion gap: 2 — ABNORMAL LOW (ref 5–15)
BUN: 16 mg/dL (ref 6–20)
CO2: 25 mmol/L (ref 22–32)
Calcium: 7 mg/dL — ABNORMAL LOW (ref 8.9–10.3)
Chloride: 109 mmol/L (ref 101–111)
Creatinine, Ser: 0.79 mg/dL (ref 0.44–1.00)
GFR calc Af Amer: >60 mL/min (ref >60)
GFR calc non Af Amer: >60 mL/min (ref >60)
Glucose, Bld: 114 mg/dL — ABNORMAL HIGH (ref 65–99)
Potassium: 3.5 mmol/L (ref 3.5–5.1)
Sodium: 136 mmol/L (ref 135–145)

## 2017-05-07 LAB — BPAM RBC
Blood Product Expiration Date: 201805312359
Blood Product Expiration Date: 201806012359
Blood Product Expiration Date: 201806042359
Blood Product Expiration Date: 201806042359
Blood Product Expiration Date: 201806052359
Blood Product Expiration Date: 201806062359
Blood Product Expiration Date: 201806062359
Blood Product Expiration Date: 201806062359
ISSUE DATE / TIME: 201805120516
ISSUE DATE / TIME: 201805121035
ISSUE DATE / TIME: 201805121524
ISSUE DATE / TIME: 201805130931
ISSUE DATE / TIME: 201805131523
ISSUE DATE / TIME: 201805151059
ISSUE DATE / TIME: 201805151346
ISSUE DATE / TIME: 201805151816
Unit Type and Rh: 5100
Unit Type and Rh: 5100
Unit Type and Rh: 5100
Unit Type and Rh: 5100
Unit Type and Rh: 5100
Unit Type and Rh: 5100
Unit Type and Rh: 5100
Unit Type and Rh: 5100

## 2017-05-07 LAB — CBC
HCT: 23 % — ABNORMAL LOW (ref 35.0–47.0)
Hemoglobin: 7.8 g/dL — ABNORMAL LOW (ref 12.0–16.0)
MCH: 30.5 pg (ref 26.0–34.0)
MCHC: 33.9 g/dL (ref 32.0–36.0)
MCV: 90.1 fL (ref 80.0–100.0)
Platelets: 220 10*3/uL (ref 150–440)
RBC: 2.55 MIL/uL — ABNORMAL LOW (ref 3.80–5.20)
RDW: 17.5 % — ABNORMAL HIGH (ref 11.5–14.5)
WBC: 15.9 10*3/uL — ABNORMAL HIGH (ref 3.6–11.0)

## 2017-05-07 LAB — HEMOGLOBIN AND HEMATOCRIT, BLOOD
HCT: 24.9 % — ABNORMAL LOW (ref 35.0–47.0)
HCT: 25.3 % — ABNORMAL LOW (ref 35.0–47.0)
Hemoglobin: 8.3 g/dL — ABNORMAL LOW (ref 12.0–16.0)
Hemoglobin: 8.5 g/dL — ABNORMAL LOW (ref 12.0–16.0)

## 2017-05-07 SURGERY — IMAGING PROCEDURE, GI TRACT, INTRALUMINAL, VIA CAPSULE

## 2017-05-07 NOTE — Progress Notes (Signed)
Jacqueline MoodKiran Danell Vazquez MD [address] [place], [place] [address] Phone: [phone] Fax : [phone]   Milus MallickKay W Adamik is being followed for GI bleed   Subjective: Last bowel movement yesterday which she states was small. Had multiple blood transfusions yesteday    Objective: Vital signs in last 24 hours: Vitals:   05/07/17 0400 05/07/17 0500 05/07/17 0600 05/07/17 0800  BP: (!) 117/54 138/68 (!) 123/58   Pulse: 82 93 97 98  Resp: 15 20 16  (!) 23  Temp:    98.2 F (36.8 C)  TempSrc:    Oral  SpO2: 97% 98% 96% 98%  Weight:      Height:       Weight change:   Intake/Output Summary (Last 24 hours) at 05/07/17 0947 Last data filed at 05/07/17 [id number]  Gross per 24 hour  Intake             2882 ml  Output              225 ml  Net             2657 ml     Exam: Heart:: Regular rate and rhythm, S1S2 present or without murmur or extra heart sounds Lungs: normal, clear to auscultation and clear to auscultation and percussion Abdomen: soft, nontender, normal bowel sounds   Lab Results: @LABTEST2 @ Micro Results: Recent Results (from the past 240 hour(s))  MRSA PCR Screening     Status: None   Collection Time: 04/28/17  5:01 PM  Result Value Ref Range Status   MRSA by PCR NEGATIVE NEGATIVE Final    Comment:        The GeneXpert MRSA Assay (FDA approved for NASAL specimens only), is one component of a comprehensive MRSA colonization surveillance program. It is not intended to diagnose MRSA infection nor to guide or monitor treatment for MRSA infections.   Gastrointestinal Panel by PCR , Stool     Status: None   Collection Time: 05/03/17  4:22 PM  Result Value Ref Range Status   Campylobacter species NOT DETECTED NOT DETECTED Final   Plesimonas shigelloides NOT DETECTED NOT DETECTED Final   Salmonella species NOT DETECTED NOT DETECTED Final   Yersinia enterocolitica NOT DETECTED NOT DETECTED Final   Vibrio species NOT DETECTED NOT DETECTED Final   Vibrio cholerae  NOT DETECTED NOT DETECTED Final   Enteroaggregative E coli (EAEC) NOT DETECTED NOT DETECTED Final   Enteropathogenic E coli (EPEC) NOT DETECTED NOT DETECTED Final   Enterotoxigenic E coli (ETEC) NOT DETECTED NOT DETECTED Final   Shiga like toxin producing E coli (STEC) NOT DETECTED NOT DETECTED Final   Shigella/Enteroinvasive E coli (EIEC) NOT DETECTED NOT DETECTED Final   Cryptosporidium NOT DETECTED NOT DETECTED Final   Cyclospora cayetanensis NOT DETECTED NOT DETECTED Final   Entamoeba histolytica NOT DETECTED NOT DETECTED Final   Giardia lamblia NOT DETECTED NOT DETECTED Final   Adenovirus F40/41 NOT DETECTED NOT DETECTED Final   Astrovirus NOT DETECTED NOT DETECTED Final   Norovirus GI/GII NOT DETECTED NOT DETECTED Final   Rotavirus A NOT DETECTED NOT DETECTED Final   Sapovirus (I, II, IV, and V) NOT DETECTED NOT DETECTED Final  C difficile quick scan w PCR reflex     Status: None   Collection Time: 05/03/17  4:22 PM  Result Value Ref Range Status   C Diff antigen NEGATIVE NEGATIVE Final   C Diff toxin NEGATIVE NEGATIVE Final   C Diff interpretation No C. difficile detected.  Final  MRSA PCR Screening     Status: None   Collection Time: 05/06/17  8:42 PM  Result Value Ref Range Status   MRSA by PCR NEGATIVE NEGATIVE Final    Comment:        The GeneXpert MRSA Assay (FDA approved for NASAL specimens only), is one component of a comprehensive MRSA colonization surveillance program. It is not intended to diagnose MRSA infection nor to guide or monitor treatment for MRSA infections.    Studies/Results: Nm Gi Blood Loss  Result Date: 05/06/2017 CLINICAL DATA:  Read bloody stool episodes for 1 week, colonoscopy 04/30/2017, upper endoscopy 05/03/2017, acute post hemorrhagic anemia, patient receiving blood during scan. EXAM: NUCLEAR MEDICINE GASTROINTESTINAL BLEEDING SCAN TECHNIQUE: Sequential abdominal images were obtained following intravenous administration of Tc-69m labeled  red blood cells. RADIOPHARMACEUTICALS:  22.8 mCi Tc-69m in-vitro labeled red cells. COMPARISON:  None. FINDINGS: No abnormal radiotracer identified that would confirm active GI bleed. IMPRESSION: No active GI bleeding site identified. Electronically Signed   By: Bary Richard M.D.   On: 05/06/2017 14:31   Medications: I have reviewed the patient's current medications. Scheduled Meds: . ferrous gluconate  324 mg Oral Q breakfast  . levothyroxine  25 mcg Oral QAC breakfast  . magnesium oxide  400 mg Oral Daily  . oxybutynin  5 mg Oral BID  . risperiDONE  4 mg Oral QHS  . traZODone  100 mg Oral QHS  . venlafaxine XR  150 mg Oral QHS   Continuous Infusions: . sodium chloride 125 mL/hr at 05/07/17 0332  . sodium chloride Stopped (05/03/17 1834)  . sodium chloride    . pantoprozole (PROTONIX) infusion 8 mg/hr (05/07/17 0044)   PRN Meds:.acetaminophen **OR** acetaminophen, fluticasone, loperamide, LORazepam, morphine injection, ondansetron **OR** ondansetron (ZOFRAN) IV   Assessment: Active Problems:   GI bleed   Acute posthemorrhagic anemia   History of recent blood transfusion   Hyponatremia   Dehydration   Acute renal insufficiency   Leukocytosis   Diarrhea   Anemia TALEYA Williams is a 66 y.o. female was admitted initially on 04/28/17-04/30/17 and I was consulted to see her and at that time I felt she had a diverticular bleed and it had stopped at the time of her colonoscopy on  04/30/18 showed non bleeding diverticuli. She was discharged and again admitted on  05/03/17 and was seen by GI Dr Orvan Falconer for rectal bleeding who then performed an EGD on 05/03/17 and during the hospitalization . While awaiting discharge noted further drop in HB with no overt blood loss. 1 episode of black stool small in qty , has had multiple units of PRBC.   Plan: 1. Monitor CBC and transfuse as needed  2. Capsule study today  3. IF has further drop in HB and no overt gi bleed then suggest CT scan of the abdomen  to r/o intraabdominal bleed and evaluate for hemolysis as well     LOS: 4 days   Jacqueline Williams 05/07/2017, 9:47 AM

## 2017-05-07 NOTE — Progress Notes (Signed)
[facility] - [place] at [facility]   PATIENT NAME: Jacqueline Williams    MR#:  [id number]  DATE OF BIRTH:  01-23-1951  SUBJECTIVE:  CHIEF COMPLAINT:   Chief Complaint  Patient presents with  . Weakness   The patient is 66 year old Caucasian female with past medical history significant for history of chronic back pain, hypothyroidism, who presented to the hospital with complaints of generalized weakness and fatigue, multiple episodes of rectal bleeding. She admitted of back pain as well as chest pain. On arrival to emergency room, her hemoglobin level was found to be 5.3. Apparently, patient had colonoscopy few days ago, which revealed hemorrhoids. No nausea, vomiting. No hematemesis was noted. Patient was admitted to the hospital for further evaluation, treatment, she was transfused 3 units of  packed red blood cells after which hemoglobin level improved to 8.1 today. She feels overall better , no more bleeding, no diarrheal stool. Patient continues to have some diarrheal stool, stool cultures were negative, including C. difficile. She underwent EGD 05/03/2017. The patient was initiated on soft diet, however, that rebled, had black stool and the hemoglobin level dropped down significantly earlier today. Tachycardic, but not hypotensive, is receiving blood transfusion. She underwent Physical Bleeding Scan, Which Was Negative. I Discussed Patient's Case with Dr. Tobi Bastos, Who Recommended capsule endoscopy. Patient had numerous black bowel movements Day before yesterday, Also bright red blood with clots. Last black bowel movement yesterday at around 2 PM. Patient was transfused packed red blood cells, given IV fluid bolus, her blood pressure, heart rate, hemoglobin level has improved. She feels comfortable today. Denies any pain, denies any shortness of breath, swelling     ,  Constitutional: Negative for chills, fever and weight loss.  HENT: Negative for congestion.   Eyes:  Negative for blurred vision and double vision.  Respiratory: Negative for cough, sputum production, shortness of breath and wheezing.   Cardiovascular: Positive for chest pain. Negative for palpitations, orthopnea, leg swelling and PND.  Gastrointestinal: Positive for blood in stool and melena. Negative for abdominal pain, constipation, diarrhea, nausea and vomiting.  Genitourinary: Negative for dysuria, frequency, hematuria and urgency.  Musculoskeletal: Positive for back pain. Negative for falls.  Neurological: Positive for weakness. Negative for dizziness, tremors, focal weakness and headaches.  Endo/Heme/Allergies: Does not bruise/bleed easily.  Psychiatric/Behavioral: Negative for depression. The patient does not have insomnia.     VITAL SIGNS: Blood pressure (!) 156/61, pulse 92, temperature 98.1 F (36.7 C), temperature source Oral, resp. rate (!) 22, height 5\' 7"  (1.702 m), weight 123.9 kg (273 lb 1.6 oz), SpO2 100 %.  PHYSICAL EXAMINATION:   GENERAL:  66 y.o.-year-old patient lying in the bed with no acute distress. Better complexion/facial color today,  EYES: Pupils equal, round, reactive to light and accommodation. No scleral icterus. Extraocular muscles intact.  HEENT: Head atraumatic, normocephalic. Oropharynx and nasopharynx clear.  NECK:  Supple, no jugular venous distention. No thyroid enlargement, no tenderness.  LUNGS: Normal breath sounds bilaterally, no wheezing, rales,rhonchi or crepitation. No use of accessory muscles of respiration.  CARDIOVASCULAR: S1, S2 normal. No murmurs, rubs, or gallops.  ABDOMEN: Soft, nontender, nondistended. Bowel sounds present. No organomegaly or mass.  EXTREMITIES: No pedal edema, cyanosis, or clubbing.  NEUROLOGIC: Cranial nerves II through XII are intact. Muscle strength 5/5 in all extremities. Sensation intact. Gait not checked.  PSYCHIATRIC: The patient is alert and oriented x 3.  SKIN: No obvious rash, lesion, or ulcer.    ORDERS/RESULTS REVIEWED:  CBC  Recent Labs Lab 05/03/17 0158  05/04/17 0342  05/04/17 1917 05/05/17 0336 05/05/17 1702 05/06/17 0841 05/06/17 1625 05/07/17 0410 05/07/17 1131  WBC 18.7*  --  14.1*  --   --  14.1*  --   --   --  15.9*  --   HGB 5.3*  < > 6.9*  < > 8.8* 8.1* 7.3* 5.6* 5.2* 7.8* 8.3*  HCT 15.6*  < > 20.2*  < > 25.9* 23.9*  --   --  15.5* 23.0* 24.9*  PLT 290  --  221  --   --  220  --   --   --  220  --   MCV 93.8  --  94.4  --   --  92.8  --   --   --  90.1  --   MCH 32.1  --  32.2  --   --  31.6  --   --   --  30.5  --   MCHC 34.2  --  34.1  --   --  34.0  --   --   --  33.9  --   RDW 15.5*  --  15.9*  --   --  16.4*  --   --   --  17.5*  --   < > = values in this interval not displayed. ------------------------------------------------------------------------------------------------------------------  Chemistries   Recent Labs Lab 05/03/17 0158 05/03/17 0949 05/04/17 0342 05/05/17 0336 05/07/17 0410  NA 134* 135 139 139 136  K 4.0 4.3 3.9 3.8 3.5  CL 100* 102 106 106 109  CO2 26 24 27 26 25   GLUCOSE 177* 130* 112* 145* 114*  BUN 43* 37* 26* 19 16  CREATININE 1.29* 1.14* 1.06* 0.95 0.79  CALCIUM 8.4* 8.5* 7.6* 7.4* 7.0*  AST 23  --   --   --   --   ALT 15  --   --   --   --   ALKPHOS 74  --   --   --   --   BILITOT 0.5  --   --   --   --    ------------------------------------------------------------------------------------------------------------------ estimated creatinine clearance is 98.3 mL/min (by C-G formula based on SCr of 0.79 mg/dL). ------------------------------------------------------------------------------------------------------------------ No results for input(s): TSH, T4TOTAL, T3FREE, THYROIDAB in the last 72 hours.  Invalid input(s): FREET3  Cardiac Enzymes  Recent Labs Lab 05/03/17 0158  TROPONINI <0.03    ------------------------------------------------------------------------------------------------------------------ Invalid input(s): POCBNP ---------------------------------------------------------------------------------------------------------------  RADIOLOGY: Nm Gi Blood Loss  Result Date: 05/06/2017 CLINICAL DATA:  Read bloody stool episodes for 1 week, colonoscopy 04/30/2017, upper endoscopy 05/03/2017, acute post hemorrhagic anemia, patient receiving blood during scan. EXAM: NUCLEAR MEDICINE GASTROINTESTINAL BLEEDING SCAN TECHNIQUE: Sequential abdominal images were obtained following intravenous administration of Tc-4248m labeled red blood cells. RADIOPHARMACEUTICALS:  22.8 mCi Tc-6248m in-vitro labeled red cells. COMPARISON:  None. FINDINGS: No abnormal radiotracer identified that would confirm active GI bleed. IMPRESSION: No active GI bleeding site identified. Electronically Signed   By: Bary RichardStan  Maynard M.D.   On: 05/06/2017 14:31    EKG:  Orders placed or performed during the hospital encounter of 05/03/17  . EKG 12-Lead  . EKG 12-Lead    ASSESSMENT AND PLAN:  Active Problems:   GI bleed   Acute posthemorrhagic anemia   History of recent blood transfusion   Hyponatremia   Dehydration   Acute renal insufficiency   Leukocytosis   Diarrhea   Anemia  #1. Acute post hemorrhagic anemia, Status post  4 ? units of packed red blood cell transfusion during this admission, follow hemoglobin level  closely, gastrointestinal bleeding scan was negative, blood pressure has improved after IV fluid administration, transfusion, remains stable at present, patient is to continue nothing by mouthstatus, discussed with Dr. Nicholos Johns. Patient was seen by Dr. Tobi Bastos, undergoing capsule endoscopy today.   #2. Gastrointestinal bleed, acute, black and bright red blood, status post recent colonoscopy which revealed small internal nonbleeding hemorrhoids, status post EGD 05/03/2017, unremarkable, appreciate  gastroenterology's input,  tagged red blood cell scan was  negative, capsule endoscopy study today #3 Hyponatremia, resolved with IV fluid administration, likely intravascular depletion related , follow sodium level tomorrow morning #4 acute renal insufficiency, , resolved with transfusion, IV fluids, , follow creatinine due to ongoing bleed, tachycardia, intravascular depletion, diarrhea  #5 Leukocytosis, etiology is unclear, no obvious infection, possibly stress/transfusion related, follow CBC in the morning. The patient is afebrile, not on antibiotic therapy at this time. C. difficile and stool cultures  were  negative  #6 diarrhea, C. difficile and stool gastrointestinal panel were negative, suspect melena, supportive therapy with Imodium, IV fluids, the patient is nothing by mouth,  resolved  Management plans discussed with the patient, family and they are in agreement.   DRUG ALLERGIES:  Allergies  Allergen Reactions  . Other Shortness Of Breath    caffergot  . Penicillins   . Seroquel [Quetiapine Fumarate] Other (See Comments)    hallucinations    CODE STATUS:     Code Status Orders        Start     Ordered   05/03/17 0526  Full code  Continuous     05/03/17 0525    Code Status History    Date Active Date Inactive Code Status Order ID Comments User Context   04/28/2017  3:02 PM 04/30/2017  7:04 PM Full Code [id number]  Altamese Dilling, MD Inpatient    Advance Directive Documentation     Most Recent Value  Type of Advance Directive  Healthcare Power of Attorney  Pre-existing out of facility DNR order (yellow form or pink MOST form)  -  "MOST" Form in Place?  -      TOTAL Critical care TIME TAKING CARE OF THIS PATIENT: 35 minutes.  Discussed with Erick Alley M.D on 05/07/2017 at 2:54 PM  Between 7am to 6pm - Pager - [phone]  After 6pm go to [email or web address] - password [email or web address] [facility]  [place]  Office   [phone]  CC: Primary care physician; Clovis Pu, Arturo Morton, DO

## 2017-05-07 NOTE — Progress Notes (Signed)
Spoke with [name], stated that the plan today was to do a capsule study. Waiting on MD to round to explain procedure to patient. Per [name], hold morning medications until after capsule study. Jacqueline Williams

## 2017-05-07 NOTE — Progress Notes (Signed)
Patient had capsule study completed today. No bloody stools or other signs of bleeding today. Hgb has increased to 8.3. Adequate UOP for shift. occasional complaints of pain, morphine given with relief. Family updated on plan of care.  Trudee KusterBrandi R Mansfield

## 2017-05-08 ENCOUNTER — Encounter: Payer: Self-pay | Admitting: Gastroenterology

## 2017-05-08 ENCOUNTER — Inpatient Hospital Stay: Payer: Medicare PPO

## 2017-05-08 LAB — CBC WITH DIFFERENTIAL/PLATELET
Basophils Absolute: 0 10*3/uL (ref 0–0.1)
Basophils Relative: 0 %
Eosinophils Absolute: 0.1 10*3/uL (ref 0–0.7)
Eosinophils Relative: 1 %
HCT: 25.4 % — ABNORMAL LOW (ref 35.0–47.0)
Hemoglobin: 8.4 g/dL — ABNORMAL LOW (ref 12.0–16.0)
Lymphocytes Relative: 19 %
Lymphs Abs: 2.8 10*3/uL (ref 1.0–3.6)
MCH: 30.6 pg (ref 26.0–34.0)
MCHC: 33.1 g/dL (ref 32.0–36.0)
MCV: 92.6 fL (ref 80.0–100.0)
Monocytes Absolute: 0.9 10*3/uL (ref 0.2–0.9)
Monocytes Relative: 6 %
Neutro Abs: 11 10*3/uL — ABNORMAL HIGH (ref 1.4–6.5)
Neutrophils Relative %: 74 %
Platelets: 278 10*3/uL (ref 150–440)
RBC: 2.74 MIL/uL — ABNORMAL LOW (ref 3.80–5.20)
RDW: 18.4 % — ABNORMAL HIGH (ref 11.5–14.5)
WBC: 14.8 10*3/uL — ABNORMAL HIGH (ref 3.6–11.0)
nRBC: 3 /100 WBC — ABNORMAL HIGH

## 2017-05-08 LAB — TYPE AND SCREEN
ABO/RH(D): O POS
Antibody Screen: NEGATIVE
Unit division: 0
Unit division: 0

## 2017-05-08 LAB — BPAM RBC
Blood Product Expiration Date: 201806072359
Blood Product Expiration Date: 201806072359
ISSUE DATE / TIME: 201805152102
ISSUE DATE / TIME: 201805160047
Unit Type and Rh: 5100
Unit Type and Rh: 5100

## 2017-05-08 LAB — BASIC METABOLIC PANEL
Anion gap: 5 (ref 5–15)
BUN: 13 mg/dL (ref 6–20)
CO2: 27 mmol/L (ref 22–32)
Calcium: 7.2 mg/dL — ABNORMAL LOW (ref 8.9–10.3)
Chloride: 111 mmol/L (ref 101–111)
Creatinine, Ser: 0.87 mg/dL (ref 0.44–1.00)
GFR calc Af Amer: >60 mL/min (ref >60)
GFR calc non Af Amer: >60 mL/min (ref >60)
Glucose, Bld: 107 mg/dL — ABNORMAL HIGH (ref 65–99)
Potassium: 3.7 mmol/L (ref 3.5–5.1)
Sodium: 143 mmol/L (ref 135–145)

## 2017-05-08 MED ORDER — BOOST / RESOURCE BREEZE PO LIQD
1.0000 | Freq: Three times a day (TID) | ORAL | Status: DC
  Administered 2017-05-08 – 2017-05-12 (×6): 1 via ORAL

## 2017-05-08 MED ORDER — ADULT MULTIVITAMIN W/MINERALS CH
1.0000 | ORAL_TABLET | Freq: Every day | ORAL | Status: DC
  Administered 2017-05-08 – 2017-05-12 (×5): 1 via ORAL
  Filled 2017-05-08 (×5): qty 1

## 2017-05-08 MED ORDER — MAGNESIUM CITRATE PO SOLN
1.0000 | Freq: Once | ORAL | Status: AC
Start: 2017-05-08 — End: 2017-05-08
  Administered 2017-05-08: 1 via ORAL
  Filled 2017-05-08: qty 296

## 2017-05-08 NOTE — Progress Notes (Deleted)
Reviewing allergies with patient and she is unsure of what she is a allergic to

## 2017-05-08 NOTE — Progress Notes (Signed)
Capsule study   Findings  1. The capsule stayed in her stomach for 4 hours and 17 mins and moved to her jejunum at that point. At the 4 hour 30 mins mark I could see some blood which I would assume since it was bright red is from a lesion bleeding in the vicinity   2. Since the capsule spend an abnormal amount of time in the stomach , we ran out of recording time at the end of the study at 7 hours and 30 mins and the capsule was only probably in the mid small bowel and hence incomplete   Plan   1. Monitor CBC and for signs of overt bleeding  2. X ray abdomen to check for expelling the capsule from her body  3. If capsule has been expelled then will plan for push enteroscopy with endoscopic deployment of the capsule directly in her jejunum tomorrow  4. Can continue on clears till we get the results of the abdominal  X ray   Dr Wyline MoodKiran Adreonna Yontz  Gastroenterology/Hepatology Pager: [phone]

## 2017-05-08 NOTE — Progress Notes (Signed)
Notified MD of large bruise on left arm. Pt reported that it came from an iv stick. Per MD wrap arm and will continue to monitor. Also notifeid MD that daughter had stated that pt had a redo of a rouen-y 10 years ago and was wondering if this may be part of the problem. Per MD she does not believe that this is the case as her stomach has already been examined per scope.

## 2017-05-08 NOTE — Progress Notes (Signed)
Initial Nutrition Assessment  DOCUMENTATION CODES:   Morbid obesity  INTERVENTION:   Boost Breeze po TID, each supplement provides 250 kcal and 9 grams of protein  MVI  NUTRITION DIAGNOSIS:   Increased nutrient needs related to other (see comment) (clear liquid diet and morbid obesity) as evidenced by increased estimated needs from protein.  GOAL:   Patient will meet greater than or equal to 90% of their needs  MONITOR:   PO intake, Supplement acceptance, Labs, Weight trends  REASON FOR ASSESSMENT:   NPO/Clear Liquid Diet    ASSESSMENT:   66 year old Caucasian female with past medical history significant for history of chronic back pain, hypothyroidism, who presented to the hospital with complaints of generalized weakness and fatigue, multiple episodes of rectal bleeding. Pt was admitted initially on 04/28/17-04/30/17 and it was felt she had a diverticular bleed and it had stopped at the time of her colonoscopy on 04/30/18 which showed non bleeding diverticuli. She was discharged and again admitted on 05/03/17 and was seen by GI Dr Tarri Glenn for rectal bleeding who then performed an EGD on 05/03/17 and during the hospitalization   Met with pt in room today. Pt reports good appetite and oral intake pta. Pt denies any weight loss; no weight history in chart. Pt s/p EGD on 5/12, colonoscopy 5/9, and capsule EGD on 5/16. Per MD note, unable to finnish complete capsule study r/t prolonged capsule time in the stomach and limited recording time. Plan is for pt to have endoscopic deployment of the capsule directly in her jejunum tomorrow. RD will order Boost Breeze to help pt meet estimated protein needs as pt is morbidly obese and on clear liquid diet. Pt reports eating 100% clear liquid diet today. RD will order Premier Protein when diet advanced.   Medications reviewed and include: ferrous gluconate, synthroid, Mg Oxide, protonix, morphine   Labs reviewed: Ca 7.2(L) Wbc- 14.8(L), hgb 8.4(L),  Hct 25.4(L)  Nutrition-Focused physical exam completed. Findings are no fat depletion, no muscle depletion, and mild edema.   Diet Order:  Diet - low sodium heart healthy Diet clear liquid Room service appropriate? Yes; Fluid consistency: Thin  Skin:  Reviewed, no issues  Last BM:  5/17  Height:   Ht Readings from Last 1 Encounters:  05/03/17 _0  (1.702 m)    Weight:   Wt Readings from Last 1 Encounters:  05/03/17 273 lb 1.6 oz (123.9 kg)    Ideal Body Weight:  61.3 kg  BMI:  Body mass index is 42.77 kg/m.  Estimated Nutritional Needs:   Kcal:  1900-2200kcal/day   Protein:  99-124g/day   Fluid:  >2L/day   EDUCATION NEEDS:   Education needs no appropriate at this time  Koleen Distance MS, RD, LDN Pager #- [phone]

## 2017-05-08 NOTE — Progress Notes (Signed)
Jacqueline MoodKiran Peachie Barkalow MD [address] [place], [place] [address] Phone: [phone] Fax : [phone]  [name] is being followed for gi bleed   Subjective: Doing well no rectal bleeding    Objective: Vital signs in last 24 hours: Vitals:   05/07/17 2100 05/07/17 2232 05/08/17 0528 05/08/17 1222  BP: (!) 150/54 (!) 146/46 (!) 162/56 (!) 153/53  Pulse: 91 86 97 90  Resp: (!) 22 16 14 20   Temp:  98.1 F (36.7 C) 98.3 F (36.8 C) 98 F (36.7 C)  TempSrc:  Oral Oral   SpO2: 99% 95% 93% 93%  Weight:      Height:       Weight change:   Intake/Output Summary (Last 24 hours) at 05/08/17 1249 Last data filed at 05/08/17 1126  Gross per 24 hour  Intake             5253 ml  Output             1500 ml  Net             3753 ml     Exam: Heart:: Regular rate and rhythm, S1S2 present or without murmur or extra heart sounds Lungs: normal Abdomen: soft, nontender, normal bowel sounds   Lab Results: CBC Latest Ref Rng & Units 05/08/2017 05/07/2017 05/07/2017  WBC 3.6 - 11.0 K/uL 14.8(H) - -  Hemoglobin 12.0 - 16.0 g/dL 1.3(Y8.4(L) 8.6(V8.5(L) 8.3(L)  Hematocrit 35.0 - 47.0 % 25.4(L) 25.3(L) 24.9(L)  Platelets 150 - 440 K/uL 278 - -    Micro Results: Recent Results (from the past 240 hour(s))  MRSA PCR Screening     Status: None   Collection Time: 04/28/17  5:01 PM  Result Value Ref Range Status   MRSA by PCR NEGATIVE NEGATIVE Final    Comment:        The GeneXpert MRSA Assay (FDA approved for NASAL specimens only), is one component of a comprehensive MRSA colonization surveillance program. It is not intended to diagnose MRSA infection nor to guide or monitor treatment for MRSA infections.   Gastrointestinal Panel by PCR , Stool     Status: None   Collection Time: 05/03/17  4:22 PM  Result Value Ref Range Status   Campylobacter species NOT DETECTED NOT DETECTED Final   Plesimonas shigelloides NOT DETECTED NOT DETECTED Final   Salmonella species NOT DETECTED NOT  DETECTED Final   Yersinia enterocolitica NOT DETECTED NOT DETECTED Final   Vibrio species NOT DETECTED NOT DETECTED Final   Vibrio cholerae NOT DETECTED NOT DETECTED Final   Enteroaggregative E coli (EAEC) NOT DETECTED NOT DETECTED Final   Enteropathogenic E coli (EPEC) NOT DETECTED NOT DETECTED Final   Enterotoxigenic E coli (ETEC) NOT DETECTED NOT DETECTED Final   Shiga like toxin producing E coli (STEC) NOT DETECTED NOT DETECTED Final   Shigella/Enteroinvasive E coli (EIEC) NOT DETECTED NOT DETECTED Final   Cryptosporidium NOT DETECTED NOT DETECTED Final   Cyclospora cayetanensis NOT DETECTED NOT DETECTED Final   Entamoeba histolytica NOT DETECTED NOT DETECTED Final   Giardia lamblia NOT DETECTED NOT DETECTED Final   Adenovirus F40/41 NOT DETECTED NOT DETECTED Final   Astrovirus NOT DETECTED NOT DETECTED Final   Norovirus GI/GII NOT DETECTED NOT DETECTED Final   Rotavirus A NOT DETECTED NOT DETECTED Final   Sapovirus (I, II, IV, and V) NOT DETECTED NOT DETECTED Final  C difficile quick scan w PCR reflex     Status: None   Collection Time:  05/03/17  4:22 PM  Result Value Ref Range Status   C Diff antigen NEGATIVE NEGATIVE Final   C Diff toxin NEGATIVE NEGATIVE Final   C Diff interpretation No C. difficile detected.  Final  MRSA PCR Screening     Status: None   Collection Time: 05/06/17  8:42 PM  Result Value Ref Range Status   MRSA by PCR NEGATIVE NEGATIVE Final    Comment:        The GeneXpert MRSA Assay (FDA approved for NASAL specimens only), is one component of a comprehensive MRSA colonization surveillance program. It is not intended to diagnose MRSA infection nor to guide or monitor treatment for MRSA infections.    Studies/Results: Dg Abd 1 View  Result Date: 05/08/2017 CLINICAL DATA:  Capsule study.  Evaluate for passage. EXAM: ABDOMEN - 1 VIEW COMPARISON:  CT 04/28/2017. FINDINGS: No radiopaque capsule noted . Surgical clips and sutures upper abdomen and pelvis.  Nonspecific air-filled loops small bowel noted. Colon is nondistended. No free air. Prior lumbosacral spine fusion. Degenerative changes lumbar spine and both hips. Diffuse osteopenia. Basal pleural-parenchymal thickening noted most consistent with scarring. IMPRESSION: No radiopaque capsule noted. Electronically Signed   By: Jacqueline Williams  Williams   On: 05/08/2017 11:17   Nm Gi Blood Loss  Result Date: 05/06/2017 CLINICAL DATA:  Read bloody stool episodes for 1 week, colonoscopy 04/30/2017, upper endoscopy 05/03/2017, acute post hemorrhagic anemia, patient receiving blood during scan. EXAM: NUCLEAR MEDICINE GASTROINTESTINAL BLEEDING SCAN TECHNIQUE: Sequential abdominal images were obtained following intravenous administration of Tc-9m labeled red blood cells. RADIOPHARMACEUTICALS:  22.8 mCi Tc-83m in-vitro labeled red cells. COMPARISON:  None. FINDINGS: No abnormal radiotracer identified that would confirm active GI bleed. IMPRESSION: No active GI bleeding site identified. Electronically Signed   By: Jacqueline Williams M.D.   On: 05/06/2017 14:31   Medications: I have reviewed the patient's current medications. Scheduled Meds: . ferrous gluconate  324 mg Oral Q breakfast  . levothyroxine  25 mcg Oral QAC breakfast  . magnesium oxide  400 mg Oral Daily  . oxybutynin  5 mg Oral BID  . risperiDONE  4 mg Oral QHS  . traZODone  100 mg Oral QHS  . venlafaxine XR  150 mg Oral QHS   Continuous Infusions: . sodium chloride 125 mL/hr at 05/08/17 0957  . sodium chloride Stopped (05/03/17 1834)  . sodium chloride    . pantoprozole (PROTONIX) infusion 8 mg/hr (05/08/17 0907)   PRN Meds:.acetaminophen **OR** acetaminophen, fluticasone, loperamide, LORazepam, morphine injection, ondansetron **OR** ondansetron (ZOFRAN) IV   Assessment: Active Problems:   GI bleed   Acute posthemorrhagic anemia   History of recent blood transfusion   Hyponatremia   Dehydration   Acute renal insufficiency   Leukocytosis    Diarrhea   Anemia  Jacqueline Williams is a 66 y.o. female was admitted initially on 04/28/17-04/30/17 and I was consulted to see her and at that time I felt she had a diverticular bleed and it had stopped at the time of her colonoscopy on 04/30/18 showed non bleeding diverticuli. She was discharged and again admitted on 05/03/17 and was seen by GI Dr Orvan Falconer for rectal bleeding who then performed an EGD on 05/03/17 and during the hospitalization . While awaiting discharge noted further drop in HB with no overt blood loss. 1 episode of black stool small in qty , has had multiple units of PRBC. Capsule study done yesterday  The capsule stayed in her stomach for 4 hours and 17 mins and  moved to her jejunum at that point. At the 4 hour 30 mins mark I could see some blood which I would assume since it was bright red is from a lesion bleeding in the vicinity .Marland Kitchen Since the capsule spent an abnormal amount of time in the stomach , we ran out of recording time at the end of the study at 7 hours and 30 mins and the capsule was only probably in the mid small bowel and hence incomplete  Plan:  1. Monitor CBC and for signs of overt bleeding  2.  push enteroscopy with aim to control bleeding and  with endoscopic deployment of the capsule directly in her jejunum tomorrow  3. Can continue on clears till midnight     LOS: 5 days   Jacqueline Mood 05/08/2017, 12:49 PM

## 2017-05-08 NOTE — Progress Notes (Signed)
[facility] [place] [facility]   PATIENT NAME: Jacqueline Williams    MR#:  [id number]  DATE OF BIRTH:  01/23/1951  SUBJECTIVE:  CHIEF COMPLAINT:   Chief Complaint  Patient presents with  . Weakness   The patient is 66 year old Caucasian female with past medical history significant for history of chronic back pain, hypothyroidism, who presented to the hospital with complaints of generalized weakness and fatigue, multiple episodes of rectal bleeding. She admitted of back pain as well as chest pain. On arrival to emergency room, her hemoglobin level was found to be 5.3. Apparently, patient had colonoscopy few days ago, which revealed hemorrhoids. No nausea, vomiting. No hematemesis was noted. Patient was admitted to the hospital for further evaluation, treatment, she was transfused 3 units of  packed red blood cells after which hemoglobin level improved to 8.1 today. She feels overall better , no more bleeding, no diarrheal stool. Patient continues to have some diarrheal stool, stool cultures were negative, including C. difficile. She underwent EGD 05/03/2017. The patient was initiated on soft diet, however, that rebled, had black stool and the hemoglobin level dropped down significantly earlier today. Tachycardic, but not hypotensive, is receiving blood transfusion. She underwent Physical Bleeding Scan, Which Was Negative. I Discussed Patient's Case with Dr. Tobi BastosAnna, Who Recommended capsule endoscopy. Patient had numerous black bowel movements ,  bright red blood with clots. Physical. Last black bowel movement 05/06/2017 at around 2 PM. Patient was transfused packed red blood cells, given IV fluid bolus, her blood pressure, heart rate, hemoglobin level has improved. She feels comfortable today. Denies any pain, denies any shortness of breath, swelling. The patient underwent capsule endoscopy, however, it was complicated by capsule state in the stomach for 4 hours and 17 minutes,  it wasn't complete, recommended to repeat EGD and the deployment of the capsule directly into the jejunum tomorrow. Patient is being continued on clear liquid diet and nothing by mouth after midnight for EGD tomorrow by Dr. Tobi BastosAnna. Patient feels good today. Denies abdominal pain     ,  Constitutional: Negative for chills, fever and weight loss.  HENT: Negative for congestion.   Eyes: Negative for blurred vision and double vision.  Respiratory: Negative for cough, sputum production, shortness of breath and wheezing.   Cardiovascular: Positive for chest pain. Negative for palpitations, orthopnea, leg swelling and PND.  Gastrointestinal: Positive for blood in stool and melena. Negative for abdominal pain, constipation, diarrhea, nausea and vomiting.  Genitourinary: Negative for dysuria, frequency, hematuria and urgency.  Musculoskeletal: Positive for back pain. Negative for falls.  Neurological: Positive for weakness. Negative for dizziness, tremors, focal weakness and headaches.  Endo/Heme/Allergies: Does not bruise/bleed easily.  Psychiatric/Behavioral: Negative for depression. The patient does not have insomnia.     VITAL SIGNS: Blood pressure (!) 156/61, pulse 92, temperature 98.1 F (36.7 C), temperature source Oral, resp. rate (!) 22, height 5\' 7"  (1.702 m), weight 123.9 kg (273 lb 1.6 oz), SpO2 100 %.  PHYSICAL EXAMINATION:   GENERAL:  66 y.o.-year-old patient lying in the bed with no acute distress. Better complexion/facial color today,  EYES: Pupils equal, round, reactive to light and accommodation. No scleral icterus. Extraocular muscles intact.  HEENT: Head atraumatic, normocephalic. Oropharynx and nasopharynx clear.  NECK:  Supple, no jugular venous distention. No thyroid enlargement, no tenderness.  LUNGS: Normal breath sounds bilaterally, no wheezing, rales,rhonchi or crepitation. No use of accessory muscles of respiration.  CARDIOVASCULAR: S1, S2 normal. No murmurs, rubs, or  gallops.  ABDOMEN: Soft, nontender, nondistended. Bowel sounds present. No organomegaly or mass.  EXTREMITIES: No pedal edema, cyanosis, or clubbing.  NEUROLOGIC: Cranial nerves II through XII are intact. Muscle strength 5/5 in all extremities. Sensation intact. Gait not checked.  PSYCHIATRIC: The patient is alert and oriented x 3.  SKIN: No obvious rash, lesion, or ulcer.   ORDERS/RESULTS REVIEWED:   CBC  Recent Labs Lab 05/03/17 0158  05/04/17 0342  05/04/17 1917 05/05/17 0336 05/05/17 1702 05/06/17 0841 05/06/17 1625 05/07/17 0410 05/07/17 1131  WBC 18.7*  --  14.1*  --   --  14.1*  --   --   --  15.9*  --   HGB 5.3*  < > 6.9*  < > 8.8* 8.1* 7.3* 5.6* 5.2* 7.8* 8.3*  HCT 15.6*  < > 20.2*  < > 25.9* 23.9*  --   --  15.5* 23.0* 24.9*  PLT 290  --  221  --   --  220  --   --   --  220  --   MCV 93.8  --  94.4  --   --  92.8  --   --   --  90.1  --   MCH 32.1  --  32.2  --   --  31.6  --   --   --  30.5  --   MCHC 34.2  --  34.1  --   --  34.0  --   --   --  33.9  --   RDW 15.5*  --  15.9*  --   --  16.4*  --   --   --  17.5*  --   < > = values in this interval not displayed. ------------------------------------------------------------------------------------------------------------------  Chemistries   Recent Labs Lab 05/03/17 0158 05/03/17 0949 05/04/17 0342 05/05/17 0336 05/07/17 0410  NA 134* 135 139 139 136  K 4.0 4.3 3.9 3.8 3.5  CL 100* 102 106 106 109  CO2 26 24 27 26 25   GLUCOSE 177* 130* 112* 145* 114*  BUN 43* 37* 26* 19 16  CREATININE 1.29* 1.14* 1.06* 0.95 0.79  CALCIUM 8.4* 8.5* 7.6* 7.4* 7.0*  AST 23  --   --   --   --   ALT 15  --   --   --   --   ALKPHOS 74  --   --   --   --   BILITOT 0.5  --   --   --   --    ------------------------------------------------------------------------------------------------------------------ estimated creatinine clearance is 98.3 mL/min (by C-G formula based on SCr of 0.79  mg/dL). ------------------------------------------------------------------------------------------------------------------ No results for input(s): TSH, T4TOTAL, T3FREE, THYROIDAB in the last 72 hours.  Invalid input(s): FREET3  Cardiac Enzymes  Recent Labs Lab 05/03/17 0158  TROPONINI <0.03   ------------------------------------------------------------------------------------------------------------------ Invalid input(s): POCBNP ---------------------------------------------------------------------------------------------------------------  RADIOLOGY: Nm Gi Blood Loss  Result Date: 05/06/2017 CLINICAL DATA:  Read bloody stool episodes for 1 week, colonoscopy 04/30/2017, upper endoscopy 05/03/2017, acute post hemorrhagic anemia, patient receiving blood during scan. EXAM: NUCLEAR MEDICINE GASTROINTESTINAL BLEEDING SCAN TECHNIQUE: Sequential abdominal images were obtained following intravenous administration of Tc-57m labeled red blood cells. RADIOPHARMACEUTICALS:  22.8 mCi Tc-82m in-vitro labeled red cells. COMPARISON:  None. FINDINGS: No abnormal radiotracer identified that would confirm active GI bleed. IMPRESSION: No active GI bleeding site identified. Electronically Signed   By: Bary Richard M.D.   On: 05/06/2017 14:31    EKG:  Orders placed or performed during the  hospital encounter of 05/03/17  . EKG 12-Lead  . EKG 12-Lead    ASSESSMENT AND PLAN:  Active Problems:   GI bleed   Acute posthemorrhagic anemia   History of recent blood transfusion   Hyponatremia   Dehydration   Acute renal insufficiency   Leukocytosis   Diarrhea   Anemia  #1. Acute post hemorrhagic anemia, Status post numerous units of packed red blood cell transfusion during this admission, follow hemoglobin level  closely, bleeding scan was negative, blood pressure has improved after IV fluid administration, transfusion, remains stable at present, patient is to continue clear liquid diet. Patient was seen  by Dr. Tobi Bastos, underwent incomplete capsule endoscopy today, plan is for EGD and deployment of capsule straight to Jejunum Tomorrow.   #2. Gastrointestinal bleed, acute, black and bright red blood, status post recent colonoscopy which revealed small internal nonbleeding hemorrhoids, status post EGD 05/03/2017, unremarkable, tagged red blood cell scan was  negative, capsule endoscopy study 05/08/2017 was incomplete, plan is for EGD and deployment of capsule straight to Jejunum Tomorrow #3 Hyponatremia, resolved with IV fluid administration, likely intravascular depletion related , now off IV fluids #4 acute renal insufficiency, , resolved with transfusion, IV fluids, , follow creatinine intermittently  #5 Leukocytosis, etiology is unclear, no obvious infection, possibly stress/transfusion related, some improved white blood cell count today. The patient is afebrile, not on antibiotic therapy at this time. C. difficile and stool cultures  were  negative  #6 diarrhea, C. difficile and stool gastrointestinal panel were negative, suspect melena, supportive therapy with Imodium, IV fluids, the patient is nothing by mouth,  resolved  Management plans discussed with the patient, family and they are in agreement.   DRUG ALLERGIES:  Allergies  Allergen Reactions  . Other Shortness Of Breath    caffergot  . Penicillins   . Seroquel [Quetiapine Fumarate] Other (See Comments)    hallucinations    CODE STATUS:     Code Status Orders        Start     Ordered   05/03/17 0526  Full code  Continuous     05/03/17 0525    Code Status History    Date Active Date Inactive Code Status Order ID Comments User Context   04/28/2017  3:02 PM 04/30/2017  7:04 PM Full Code [id number]  Altamese Dilling, MD Inpatient    Advance Directive Documentation     Most Recent Value  Type of Advance Directive  Healthcare Power of Attorney  Pre-existing out of facility DNR order (yellow form or pink MOST form)  -  "MOST"  Form in Place?  -      TOTAL TIME TAKING CARE OF THIS PATIENT: 35 minutes.   Katharina Caper M.D on 05/07/2017 at 2:54 PM  Between 7am to 6pm - Pager - [phone]  After 6pm go to [email or web address] - password [email or web address] [facility]  [facility]  Office  [phone]  CC: Primary care physician; [place]is Pu, Arturo Morton, DO

## 2017-05-09 ENCOUNTER — Inpatient Hospital Stay: Payer: Medicare PPO | Admitting: Anesthesiology

## 2017-05-09 ENCOUNTER — Encounter: Payer: Self-pay | Admitting: *Deleted

## 2017-05-09 ENCOUNTER — Encounter
Admission: EM | Disposition: A | Discharge status: Home Health | Payer: Self-pay | Source: Home / Self Care | Attending: Internal Medicine

## 2017-05-09 DIAGNOSIS — K921 Melena: Secondary | ICD-10-CM

## 2017-05-09 HISTORY — PX: ENTEROSCOPY: SHX5533

## 2017-05-09 LAB — HEMOGLOBIN: Hemoglobin: 7.9 g/dL — ABNORMAL LOW (ref 12.0–16.0)

## 2017-05-09 SURGERY — ENTEROSCOPY
Anesthesia: General

## 2017-05-09 MED ORDER — MIDAZOLAM HCL 2 MG/2ML IJ SOLN
INTRAMUSCULAR | Status: AC
  Filled 2017-05-09: qty 2

## 2017-05-09 MED ORDER — FENTANYL CITRATE (PF) 100 MCG/2ML IJ SOLN: 25.0000 ug | INTRAMUSCULAR | Status: DC | PRN

## 2017-05-09 MED ORDER — PROPOFOL 10 MG/ML IV BOLUS
INTRAVENOUS | Status: AC
  Filled 2017-05-09: qty 20

## 2017-05-09 MED ORDER — GLYCOPYRROLATE 0.2 MG/ML IJ SOLN
INTRAMUSCULAR | Status: AC
  Filled 2017-05-09: qty 1

## 2017-05-09 MED ORDER — SUCCINYLCHOLINE CHLORIDE 20 MG/ML IJ SOLN
INTRAMUSCULAR | Status: AC
  Filled 2017-05-09: qty 1

## 2017-05-09 MED ORDER — PANTOPRAZOLE SODIUM 40 MG IV SOLR
40.0000 mg | Freq: Two times a day (BID) | INTRAVENOUS | Status: DC
Start: 2017-05-09 — End: 2017-05-12
  Administered 2017-05-09 – 2017-05-12 (×7): 40 mg via INTRAVENOUS
  Filled 2017-05-09 (×6): qty 40

## 2017-05-09 MED ORDER — GLYCOPYRROLATE 0.2 MG/ML IJ SOLN
INTRAMUSCULAR | Status: DC | PRN
  Administered 2017-05-09: 0.2 mg via INTRAVENOUS

## 2017-05-09 MED ORDER — EPHEDRINE SULFATE 50 MG/ML IJ SOLN
INTRAMUSCULAR | Status: AC
  Filled 2017-05-09: qty 1

## 2017-05-09 MED ORDER — ONDANSETRON HCL 4 MG/2ML IJ SOLN: 4.0000 mg | Freq: Once | INTRAMUSCULAR | Status: DC | PRN

## 2017-05-09 MED ORDER — PROPOFOL 500 MG/50ML IV EMUL
INTRAVENOUS | Status: AC
  Filled 2017-05-09: qty 50

## 2017-05-09 MED ORDER — PHENYLEPHRINE HCL 10 MG/ML IJ SOLN
INTRAMUSCULAR | Status: AC
  Filled 2017-05-09: qty 1

## 2017-05-09 MED ORDER — LIDOCAINE HCL 2 % IJ SOLN
INTRAMUSCULAR | Status: AC
  Filled 2017-05-09: qty 10

## 2017-05-09 MED ORDER — MIDAZOLAM HCL 2 MG/2ML IJ SOLN
INTRAMUSCULAR | Status: DC | PRN
  Administered 2017-05-09 (×2): 1 mg via INTRAVENOUS

## 2017-05-09 MED ORDER — SODIUM CHLORIDE 0.9 % IV SOLN
INTRAVENOUS | Status: DC
  Administered 2017-05-09: 1000 mL via INTRAVENOUS

## 2017-05-09 MED ORDER — ATROPINE SULFATE 0.4 MG/ML IV SOSY
PREFILLED_SYRINGE | INTRAVENOUS | Status: AC
  Filled 2017-05-09: qty 2.5

## 2017-05-09 MED ORDER — PROPOFOL 500 MG/50ML IV EMUL
INTRAVENOUS | Status: DC | PRN
  Administered 2017-05-09: 150 ug/kg/min via INTRAVENOUS

## 2017-05-09 MED ORDER — OXYCODONE-ACETAMINOPHEN 5-325 MG PO TABS
1.0000 | ORAL_TABLET | Freq: Four times a day (QID) | ORAL | Status: DC | PRN
  Administered 2017-05-09 – 2017-05-12 (×8): 1 via ORAL
  Filled 2017-05-09 (×8): qty 1

## 2017-05-09 MED ORDER — PROPOFOL 10 MG/ML IV BOLUS
INTRAVENOUS | Status: DC | PRN
  Administered 2017-05-09: 10 mg via INTRAVENOUS
  Administered 2017-05-09: 70 mg via INTRAVENOUS

## 2017-05-09 MED ORDER — LIDOCAINE HCL (CARDIAC) 20 MG/ML IV SOLN
INTRAVENOUS | Status: DC | PRN
  Administered 2017-05-09: 40 mg via INTRAVENOUS

## 2017-05-09 NOTE — Anesthesia Post-op Follow-up Note (Cosign Needed)
Anesthesia QCDR form completed.        

## 2017-05-09 NOTE — Anesthesia Procedure Notes (Signed)
Date/Time: 05/09/2017 7:42 AM Performed by: Stormy FabianURTIS, Emmaly Leech Pre-anesthesia Checklist: Patient identified, Emergency Drugs available, Suction available and Patient being monitored Patient Re-evaluated:Patient Re-evaluated prior to inductionOxygen Delivery Method: Nasal cannula Intubation Type: IV induction Dental Injury: Teeth and Oropharynx as per pre-operative assessment  Comments: Nasal cannula with etCO2 monitoring

## 2017-05-09 NOTE — Transfer of Care (Signed)
Immediate Anesthesia Transfer of Care Note  Patient: Jacqueline Williams  Procedure(s) Performed: Procedure(s): ENTEROSCOPY with deplopyement of capsule for small bowel (N/A)  Patient Location: PACU and Endoscopy Unit  Anesthesia Type:General  Level of Consciousness: sedated  Airway & Oxygen Therapy: Patient Spontanous Breathing and Patient connected to nasal cannula oxygen  Post-op Assessment: Report given to RN and Post -op Vital signs reviewed and stable  Post vital signs: Reviewed and stable  Last Vitals:  Vitals:   05/09/17 0830 05/09/17 0840  BP: (!) 116/53 (!) 114/56  Pulse: 96 94  Resp: 20 17  Temp: 37 C     Complications: No apparent anesthesia complications

## 2017-05-09 NOTE — Progress Notes (Signed)
[facility] Physicians - [place] at [facility] - [facility]   PATIENT NAME: Jacqueline Williams    MR#:  [id number]  DATE OF BIRTH:  1951-03-14  SUBJECTIVE:  CHIEF COMPLAINT:   Chief Complaint  Patient presents with  . Weakness   The patient is 66 year old Caucasian female with past medical history significant for history of chronic back pain, hypothyroidism, who presented to the hospital with complaints of generalized weakness and fatigue, multiple episodes of rectal bleeding. She admitted of back pain as well as chest pain. On arrival to emergency room, her hemoglobin level was found to be 5.3. Apparently, patient had colonoscopy few days ago, which revealed hemorrhoids. No nausea, vomiting. No hematemesis was noted. Patient was admitted to the hospital for further evaluation, treatment, she was transfused 3 units of  packed red blood cells after which hemoglobin level improved to 8.1 today. She feels overall better , no more bleeding, no diarrheal stool. Patient continues to have some diarrheal stool, stool cultures were negative, including C. difficile. She underwent EGD 05/03/2017. The patient was initiated on soft diet, however, that rebled, had black stool and the hemoglobin level dropped down significantly earlier today. Tachycardic, but not hypotensive, is receiving blood transfusion. She underwent Physical Bleeding Scan, Which Was Negative. I Discussed Patient's Case with Dr. Tobi Bastos, Who Recommended capsule endoscopy. Patient had numerous black bowel movements ,  bright red blood with clots. Physical. Last black bowel movement 05/06/2017 at around 2 PM. Patient was transfused packed red blood cells, given IV fluid bolus, her blood pressure, heart rate, hemoglobin level has improved. She feels comfortable today. Denies any pain, denies any shortness of breath, swelling. The patient underwent capsule endoscopy, however, it was complicated by capsule state in the stomach for 4 hours and 17 minutes,  it wasn't complete, repeated EGD Today, 05/09/2017 by Dr. Tobi Bastos and the deployment of the capsule directly into the jejunum was accomplished without difficulty, EGD was unremarkable. Patient is being continued on clear liquid diet . No abdominal pain, no rectal bleeding, hemoglobin level remains relatively stable     ,  Constitutional: Negative for chills, fever and weight loss.  HENT: Negative for congestion.   Eyes: Negative for blurred vision and double vision.  Respiratory: Negative for cough, sputum production, shortness of breath and wheezing.   Cardiovascular: Positive for chest pain. Negative for palpitations, orthopnea, leg swelling and PND.  Gastrointestinal: Positive for blood in stool and melena. Negative for abdominal pain, constipation, diarrhea, nausea and vomiting.  Genitourinary: Negative for dysuria, frequency, hematuria and urgency.  Musculoskeletal: Positive for back pain. Negative for falls.  Neurological: Positive for weakness. Negative for dizziness, tremors, focal weakness and headaches.  Endo/Heme/Allergies: Does not bruise/bleed easily.  Psychiatric/Behavioral: Negative for depression. The patient does not have insomnia.     VITAL SIGNS: Blood pressure (!) 156/61, pulse 92, temperature 98.1 F (36.7 C), temperature source Oral, resp. rate (!) 22, height 5\' 7"  (1.702 m), weight 123.9 kg (273 lb 1.6 oz), SpO2 100 %.  PHYSICAL EXAMINATION:   GENERAL:  66 y.o.-year-old patient lying in the bed with no acute distress. Comfortable, happy, joking  EYES: Pupils equal, round, reactive to light and accommodation. No scleral icterus. Extraocular muscles intact.  HEENT: Head atraumatic, normocephalic. Oropharynx and nasopharynx clear.  NECK:  Supple, no jugular venous distention. No thyroid enlargement, no tenderness.  LUNGS: Normal breath sounds bilaterally, no wheezing, rales,rhonchi or crepitation. No use of accessory muscles of respiration.  CARDIOVASCULAR: S1, S2  normal. No murmurs, rubs,  or gallops.  ABDOMEN: Soft, nontender, nondistended. Bowel sounds present. No organomegaly or mass.  EXTREMITIES: No pedal edema, cyanosis, or clubbing.  NEUROLOGIC: Cranial nerves II through XII are intact. Muscle strength 5/5 in all extremities. Sensation intact. Gait not checked.  PSYCHIATRIC: The patient is alert and oriented x 3.  SKIN: No obvious rash, lesion, or ulcer.   ORDERS/RESULTS REVIEWED:   CBC  Recent Labs Lab 05/03/17 0158  05/04/17 0342  05/04/17 1917 05/05/17 0336 05/05/17 1702 05/06/17 0841 05/06/17 1625 05/07/17 0410 05/07/17 1131  WBC 18.7*  --  14.1*  --   --  14.1*  --   --   --  15.9*  --   HGB 5.3*  < > 6.9*  < > 8.8* 8.1* 7.3* 5.6* 5.2* 7.8* 8.3*  HCT 15.6*  < > 20.2*  < > 25.9* 23.9*  --   --  15.5* 23.0* 24.9*  PLT 290  --  221  --   --  220  --   --   --  220  --   MCV 93.8  --  94.4  --   --  92.8  --   --   --  90.1  --   MCH 32.1  --  32.2  --   --  31.6  --   --   --  30.5  --   MCHC 34.2  --  34.1  --   --  34.0  --   --   --  33.9  --   RDW 15.5*  --  15.9*  --   --  16.4*  --   --   --  17.5*  --   < > = values in this interval not displayed. ------------------------------------------------------------------------------------------------------------------  Chemistries   Recent Labs Lab 05/03/17 0158 05/03/17 0949 05/04/17 0342 05/05/17 0336 05/07/17 0410  NA 134* 135 139 139 136  K 4.0 4.3 3.9 3.8 3.5  CL 100* 102 106 106 109  CO2 26 24 27 26 25   GLUCOSE 177* 130* 112* 145* 114*  BUN 43* 37* 26* 19 16  CREATININE 1.29* 1.14* 1.06* 0.95 0.79  CALCIUM 8.4* 8.5* 7.6* 7.4* 7.0*  AST 23  --   --   --   --   ALT 15  --   --   --   --   ALKPHOS 74  --   --   --   --   BILITOT 0.5  --   --   --   --    ------------------------------------------------------------------------------------------------------------------ estimated creatinine clearance is 98.3 mL/min (by C-G formula based on SCr of 0.79  mg/dL). ------------------------------------------------------------------------------------------------------------------ No results for input(s): TSH, T4TOTAL, T3FREE, THYROIDAB in the last 72 hours.  Invalid input(s): FREET3  Cardiac Enzymes  Recent Labs Lab 05/03/17 0158  TROPONINI <0.03   ------------------------------------------------------------------------------------------------------------------ Invalid input(s): POCBNP ---------------------------------------------------------------------------------------------------------------  RADIOLOGY: Nm Gi Blood Loss  Result Date: 05/06/2017 CLINICAL DATA:  Read bloody stool episodes for 1 week, colonoscopy 04/30/2017, upper endoscopy 05/03/2017, acute post hemorrhagic anemia, patient receiving blood during scan. EXAM: NUCLEAR MEDICINE GASTROINTESTINAL BLEEDING SCAN TECHNIQUE: Sequential abdominal images were obtained following intravenous administration of Tc-102m labeled red blood cells. RADIOPHARMACEUTICALS:  22.8 mCi Tc-74m in-vitro labeled red cells. COMPARISON:  None. FINDINGS: No abnormal radiotracer identified that would confirm active GI bleed. IMPRESSION: No active GI bleeding site identified. Electronically Signed   By: Bary Richard M.D.   On: 05/06/2017 14:31    EKG:  Orders placed or performed during  the hospital encounter of 05/03/17  . EKG 12-Lead  . EKG 12-Lead    ASSESSMENT AND PLAN:  Active Problems:   GI bleed   Acute posthemorrhagic anemia   History of recent blood transfusion   Hyponatremia   Dehydration   Acute renal insufficiency   Leukocytosis   Diarrhea   Anemia  #1. Acute post hemorrhagic anemia, Status post numerous units of packed red blood cell transfusion during this admission, follow hemoglobin level  closely, bleeding scan was negative, blood pressure has improved after IV fluid administration, transfusion, remains stable at present, patient is to continue clear liquid diet. Patient was seen  by Dr. Tobi BastosAnna, underwent capsule endoscopy May 17 and underwent EGD and capsule placement directly to small bowel May 18, results are pending. Hemoglobin level is stable. Marland Kitchen.   #2. Gastrointestinal bleed, acute, black and bright red blood, status post recent colonoscopy which revealed small internal nonbleeding hemorrhoids, status post EGD 05/03/2017, unremarkable, tagged red blood cell scan was  negative, capsule endoscopy study 05/08/2017 was incomplete, status post repeated  EGD and deployment of capsule straight to Jejunum 05/09/2017 by Dr. Tobi BastosAnna, results are pending .  #3 Hyponatremia, resolved with IV fluid administration, likely intravascular depletion related , now off IV fluids #4 acute renal insufficiency, , resolved with transfusion, IV fluids, , follow creatinine intermittently  #5 Leukocytosis, etiology is unclear, no obvious infection, ?stress/transfusion related,  follow white blood cell count in a.m. The patient is afebrile, not on antibiotic therapy at this time. C. difficile and stool cultures  were  negative  #6 diarrhea, C. difficile and stool gastrointestinal panel were negative, suspect melena, supportive therapy with Imodium when necessary, patient is on clear liquid diet now, diarrhea has subsided   Management plans discussed with the patient, family and they are in agreement.   DRUG ALLERGIES:  Allergies  Allergen Reactions  . Other Shortness Of Breath    caffergot  . Penicillins   . Seroquel [Quetiapine Fumarate] Other (See Comments)    hallucinations    CODE STATUS:     Code Status Orders        Start     Ordered   05/03/17 0526  Full code  Continuous     05/03/17 0525    Code Status History    Date Active Date Inactive Code Status Order ID Comments User Context   04/28/2017  3:02 PM 04/30/2017  7:04 PM Full Code [id number]  Altamese DillingVachhani, Vaibhavkumar, MD Inpatient    Advance Directive Documentation     Most Recent Value  Type of Advance Directive  Healthcare Power  of Attorney  Pre-existing out of facility DNR order (yellow form or pink MOST form)  -  "MOST" Form in Place?  -      TOTAL TIME TAKING CARE OF THIS PATIENT: 30 minutes.   Katharina CaperVAICKUTE,Marlene Beidler M.D on 05/07/2017 at 2:54 PM  Between 7am to 6pm - Pager - [phone]  After 6pm go to [email or web address] - password [email or web address] [facility]  [facility]  Office  [phone]  CC: Primary care physician; Clovis PuVan Horn, Arturo MortonJill E, DO

## 2017-05-09 NOTE — Anesthesia Preprocedure Evaluation (Signed)
Anesthesia Evaluation  Patient identified by MRN, date of birth, ID band Patient awake    Reviewed: Allergy & Precautions, H&P , NPO status , Patient's Chart, lab work & pertinent test results, reviewed documented beta blocker date and time   History of Anesthesia Complications Negative for: history of anesthetic complications  Airway Mallampati: III  TM Distance: >3 FB Neck ROM: full    Dental  (+) Edentulous Upper, Edentulous Lower   Pulmonary neg pulmonary ROS,    breath sounds clear to auscultation       Cardiovascular Exercise Tolerance: Good negative cardio ROS   Rhythm:Regular Rate:Normal     Neuro/Psych PSYCHIATRIC DISORDERS (Depression) Anxiety Depression negative neurological ROS     GI/Hepatic Neg liver ROS, GERD  ,  Endo/Other  neg diabetesHypothyroidism   Renal/GU negative Renal ROS  negative genitourinary   Musculoskeletal negative musculoskeletal ROS (+)   Abdominal (+) + obese,   Peds  Hematology negative hematology ROS (+) anemia ,   Anesthesia Other Findings Past Medical History: No date: Back pain No date: Depression No date: Thyroid disease   Reproductive/Obstetrics negative OB ROS                             Anesthesia Physical  Anesthesia Plan  ASA: III  Anesthesia Plan: General   Post-op Pain Management:    Induction:   Airway Management Planned: Natural Airway and Nasal Cannula  Additional Equipment:   Intra-op Plan:   Post-operative Plan:   Informed Consent: I have reviewed the patients History and Physical, chart, labs and discussed the procedure including the risks, benefits and alternatives for the proposed anesthesia with the patient or authorized representative who has indicated his/her understanding and acceptance.   Dental Advisory Given  Plan Discussed with: Anesthesiologist, CRNA and Surgeon  Anesthesia Plan Comments:          Anesthesia Quick Evaluation

## 2017-05-09 NOTE — H&P (Signed)
Wyline Mood MD [address] [place], [place] [address] Phone: [phone] Fax : [phone]  Primary Care Physician:  Clovis Pu, Arturo Morton, DO Primary Gastroenterologist:  Dr. Wyline Mood   Pre-Procedure History & Physical: HPI:  Jacqueline Williams is a 66 y.o. female is here for an endoscopy and placement of capsule in the small bowel .   Past Medical History:  Diagnosis Date  . Back pain   . Depression   . Thyroid disease     Past Surgical History:  Procedure Laterality Date  . ABDOMINAL HYSTERECTOMY    . CHOLECYSTECTOMY    . COLONOSCOPY WITH PROPOFOL N/A 04/30/2017   Procedure: COLONOSCOPY WITH PROPOFOL;  Surgeon: Wyline Mood, MD;  Location: [facility] ENDOSCOPY;  Service: Endoscopy;  Laterality: N/A;  . ESOPHAGOGASTRODUODENOSCOPY (EGD) WITH PROPOFOL N/A 05/03/2017   Procedure: ESOPHAGOGASTRODUODENOSCOPY (EGD) WITH PROPOFOL;  Surgeon: Wyline Mood, MD;  Location[facility] ENDOSCOPY;  Service: Gastroenterology;  Laterality: N/A;  . gastric bipass    . GIVENS CAPSULE STUDY N/A 05/07/2017   Procedure: GIVENS CAPSULE STUDY;  Surgeon: Wyline Mood, MD;  Location: [facility] ENDOSCOPY;  Service: Endoscopy;  Laterality: N/A;  . HERNIA REPAIR      Prior to Admission medications   Medication Sig Start Date End Date Taking? Authorizing Provider  acetaminophen (TYLENOL) 500 MG tablet Take 500 mg by mouth every 6 (six) hours as needed.   Yes [provider]  aspirin EC 81 MG tablet Take 81 mg by mouth daily.   Yes [provider]  calcium-vitamin D (OSCAL WITH D) 500-200 MG-UNIT tablet Take 1 tablet by mouth.   Yes [provider]  cetirizine (ZYRTEC) 10 MG tablet Take 10 mg by mouth daily.   Yes [provider]  cyanocobalamin (,VITAMIN B-12,) 1000 MCG/ML injection Inject 1 mL into the skin every 30 (thirty) days. 04/16/17  Yes [provider]  diclofenac sodium (VOLTAREN) 1 % GEL Apply topically 4 (four) times daily. Prn arthritis pain   Yes [provider]  fluticasone (FLONASE) 50 MCG/ACT nasal spray Place 1 spray into both nostrils daily as needed for allergies or rhinitis.   Yes [provider]  lactase (LACTAID) 3000 units tablet Take by mouth 3 (three) times daily with meals.   Yes [provider]  levothyroxine (SYNTHROID, LEVOTHROID) 25 MCG tablet Take 25 mcg by mouth daily. 04/26/17  Yes [provider]  lipase/protease/amylase (CREON) 12000 units CPEP capsule Take 24,000 Units by mouth 3 (three) times daily before meals.   Yes [provider]  loperamide (IMODIUM) 2 MG capsule Take by mouth as needed for diarrhea or loose stools.   Yes [provider]  LORazepam (ATIVAN) 1 MG tablet Take 0.5-1 mg by mouth 4 (four) times daily. 0.5 mg 0630,  1 mg 1100, 0.5 mg 1600, 1 mg 2030 04/05/17  Yes [provider]  magnesium oxide (MAG-OX) 400 MG tablet Take 400 mg by mouth daily.   Yes [provider]  oxybutynin (DITROPAN) 5 MG tablet Take 5 mg by mouth 2 (two) times daily.   Yes [provider]  oxyCODONE-acetaminophen (PERCOCET) 5-325 MG tablet Take 1 tablet by mouth 4 (four) times daily. 0630,1100,1600,2030   Yes [provider]  pantoprazole (PROTONIX) 40 MG tablet Take 40 mg by mouth daily.   Yes [provider]  promethazine (PHENERGAN) 25 MG tablet Take 25 mg by mouth every 6 (six) hours as needed. 04/28/17  Yes [provider]  risperidone (RISPERDAL) 4 MG  tablet Take 4 mg by mouth daily. 03/11/17  Yes [provider]  simethicone (MYLICON) 80 MG chewable tablet Chew 80 mg by mouth every 6 (six) hours as needed for flatulence.   Yes [provider]  traZODone (DESYREL) 100 MG tablet Take 100 mg by mouth at bedtime. 04/14/17  Yes [provider]  venlafaxine XR (EFFEXOR-XR) 150 MG 24 hr capsule Take 150 mg by mouth daily. 02/25/17  Yes [provider]  ferrous gluconate (FERGON) 324 MG tablet Take 1  tablet (324 mg total) by mouth 3 (three) times daily with meals. 05/05/17   Katharina CaperVaickute, Rima, MD  gabapentin (NEURONTIN) 100 MG capsule Take 100 mg by mouth 2 (two) times daily. 02/25/17   [provider]    Allergies as of 05/03/2017 - Review Complete 05/03/2017  Allergen Reaction Noted  . Other Shortness Of Breath 04/28/2017  . Penicillins  04/28/2017  . Seroquel [quetiapine fumarate] Other (See Comments) 04/28/2017    Family History  Problem Relation Age of Onset  . Lung cancer Father   . Hypertension Neg Hx   . Diabetes Neg Hx     Social History   Social History  . Marital status: Widowed    Spouse name: N/A  . Number of children: N/A  . Years of education: N/A   Occupational History  . home maker    Social History Main Topics  . Smoking status: Never Smoker  . Smokeless tobacco: Never Used  . Alcohol use No  . Drug use: No  . Sexual activity: Not on file   Other Topics Concern  . Not on file   Social History Narrative  . No narrative on file    Review of Systems: See HPI, otherwise negative ROS  Physical Exam: BP (!) 144/63   Pulse 98   Temp 100.2 F (37.9 C) (Tympanic)   Resp 20   Ht 5\' 7"  (1.702 m)   Wt 273 lb 1.6 oz (123.9 kg)   SpO2 94%   BMI 42.77 kg/m  General:   Alert,  pleasant and cooperative in NAD Head:  Normocephalic and atraumatic. Neck:  Supple; no masses or thyromegaly. Lungs:  Clear throughout to auscultation.    Heart:  Regular rate and rhythm. Abdomen:  Soft, nontender and nondistended. Normal bowel sounds, without guarding, and without rebound.   Neurologic:  Alert and  oriented x4;  grossly normal neurologically.  Impression/Plan: Jacqueline Williams is here for an endoscopy and placement of capsule in the small bowel to be performed for anemia   Risks, benefits, limitations, and alternatives regarding endoscopy and placement of capsule in the small bowel .   have been reviewed with the patient.  Questions have been answered.   All parties agreeable.   Wyline MoodKiran Dasia Guerrier, MD  05/09/2017, 7:39 AM

## 2017-05-09 NOTE — Anesthesia Postprocedure Evaluation (Signed)
Anesthesia Post Note  Patient: Milus MallickKay W Colley  Procedure(s) Performed: Procedure(s) (LRB): ENTEROSCOPY with deplopyement of capsule for small bowel (N/A)  Patient location during evaluation: PACU Anesthesia Type: General Level of consciousness: awake and alert and oriented Pain management: pain level controlled Vital Signs Assessment: post-procedure vital signs reviewed and stable Respiratory status: spontaneous breathing Cardiovascular status: blood pressure returned to baseline Anesthetic complications: no     Last Vitals:  Vitals:   05/09/17 0900 05/09/17 0933  BP: (!) 128/49 (!) 129/59  Pulse: 97 95  Resp: (!) 22 20  Temp:  37 C    Last Pain:  Vitals:   05/09/17 1013  TempSrc:   PainSc: 5                  Navy Belay

## 2017-05-09 NOTE — Op Note (Signed)
[facility] Gastroenterology Patient Name: Jacqueline Williams Procedure Date: 05/09/2017 7:42 AM MRN: [id number] Account #: [id number] Date of Birth: 1950-12-24 Admit Type: Inpatient Age: 66 Room: [facility] ENDO ROOM 4 Gender: Female Note Status: Finalized Procedure:            Small bowel enteroscopy Indications:          Melena Providers:            Wyline Mood MD, MD Referring MD:         No Local Md, MD (Referring MD) Medicines:            Monitored Anesthesia Care Complications:        No immediate complications. Procedure:            Pre-Anesthesia Assessment:                       - Prior to the procedure, a History and Physical was                        performed, and patient medications, allergies and                        sensitivities were reviewed. The patient's tolerance of                        previous anesthesia was reviewed.                       - The risks and benefits of the procedure and the                        sedation options and risks were discussed with the                        patient. All questions were answered and informed                        consent was obtained.                       - ASA Grade Assessment: III - A patient with severe                        systemic disease.                       After obtaining informed consent, the endoscope was                        passed under direct vision. Throughout the procedure,                        the patient's blood pressure, pulse, and oxygen                        saturations were monitored continuously. The Endoscope                        was introduced through the mouth and advanced to the  proximal jejunum. The small bowel enteroscopy was                        accomplished with ease. The patient tolerated the                        procedure well. Findings:      There was no evidence of significant pathology in the entire examined       portion of  jejunum.      Evidence of a gastric bypass was found. A gastric pouch with a normal       size was found. The staple line appeared intact. The gastrojejunal       anastomosis was characterized by healthy appearing mucosa. This was       traversed. The pouch-to-jejunum limb was characterized by healthy       appearing mucosa. The jejunojejunal anastomosis was characterized by       healthy appearing mucosa. Using the endoscope, the video capsule       enteroscope was advanced into the proximal jejunum.      There was an interruption in the procedure for atleast 10-15 mins as       there was some issue with the capsule reading equipment which was not       syncronized      No active bleeding seen and no site of bleeding noted to the extent the       jejunum was examined Impression:           - The examined portion of the jejunum was normal.                       - Gastric bypass with a normal-sized pouch and intact                        staple line. Gastrojejunal anastomosis characterized by                        healthy appearing mucosa.                       - Successful completion of the Video Capsule                        Enteroscope placement.                       - No specimens collected. Recommendation:       - Return patient to hospital ward for ongoing care. Procedure Code(s):    --- Professional ---                       [id number], Small intestinal endoscopy, enteroscopy beyond                        second portion of duodenum, not including ileum;                        diagnostic, including collection of specimen(s) by                        brushing or washing, when performed (separate procedure) Diagnosis Code(s):    --- Professional ---  Z98.84, Bariatric surgery status                       K92.1, Melena (includes Hematochezia) CPT copyright 2016 [employer] Association. All rights reserved. The codes documented in this report are preliminary and  upon coder review may  be revised to meet current compliance requirements. Wyline Mood, MD Wyline Mood MD, MD 05/09/2017 8:29:47 AM This report has been signed electronically. Number of Addenda: 0 Note Initiated On: 05/09/2017 7:42 AM      [facility]

## 2017-05-10 LAB — CBC
HCT: 22.9 % — ABNORMAL LOW (ref 35.0–47.0)
Hemoglobin: 7.8 g/dL — ABNORMAL LOW (ref 12.0–16.0)
MCH: 31.3 pg (ref 26.0–34.0)
MCHC: 33.9 g/dL (ref 32.0–36.0)
MCV: 92.2 fL (ref 80.0–100.0)
Platelets: 327 10*3/uL (ref 150–440)
RBC: 2.49 MIL/uL — ABNORMAL LOW (ref 3.80–5.20)
RDW: 21.7 % — ABNORMAL HIGH (ref 11.5–14.5)
WBC: 14.1 10*3/uL — ABNORMAL HIGH (ref 3.6–11.0)

## 2017-05-10 NOTE — Progress Notes (Signed)
[facility] - [place] at [facility]   PATIENT NAME: Jacqueline BatemanKay Williams    MR#:  [id number]  DATE OF BIRTH:  1951-04-05  SUBJECTIVE:  CHIEF COMPLAINT:   Chief Complaint  Patient presents with  . Weakness   The patient is 66 year old Caucasian female with past medical history significant for history of chronic back pain, hypothyroidism, who presented to the hospital with complaints of generalized weakness and fatigue, multiple episodes of rectal bleeding. She admitted of back pain as well as chest pain. On arrival to emergency room, her hemoglobin level was found to be 5.3. Apparently, patient had colonoscopy few days ago, which revealed hemorrhoids. No nausea, vomiting. No hematemesis was noted. Patient was admitted to the hospital for further evaluation, treatment, she was transfused 3 units of  packed red blood cells after which hemoglobin level improved to 8.1 today. She feels overall better , no more bleeding, no diarrheal stool. Patient continues to have some diarrheal stool, stool cultures were negative, including C. difficile. She underwent EGD 05/03/2017. The patient was initiated on soft diet, however, that rebled, had black stool and the hemoglobin level dropped down significantly earlier today. Tachycardic, but not hypotensive, is receiving blood transfusion. She underwent Physical Bleeding Scan, Which Was Negative. I Discussed Patient's Case with Dr. Tobi BastosAnna, Who Recommended capsule endoscopy. Patient had numerous black bowel movements ,  bright red blood with clots. Physical. Last black bowel movement 05/06/2017 at around 2 PM. Patient was transfused packed red blood cells, given IV fluid bolus, her blood pressure, heart rate, hemoglobin level has improved. She feels comfortable today. Denies any pain, denies any shortness of breath, swelling. The patient underwent capsule endoscopy, however, it was complicated by capsule state in the stomach for 4 hours and 17 minutes,  it wasn't complete, repeated EGD Today, 05/09/2017 by Dr. Tobi BastosAnna and the deployment of the capsule directly into the jejunum was accomplished without difficulty, EGD was unremarkable. Patient is being continued on clear liquid diet . No abdominal pain, no rectal bleeding, hemoglobin level remains relatively stable Patient is on clear liquids tolerating them. Had one green loose stool today. But no blood in it.    ,  Constitutional: Negative for chills, fever and weight loss.  HENT: Negative for congestion.   Eyes: Negative for blurred vision and double vision.  Respiratory: Negative for cough, sputum production, shortness of breath and wheezing.   Cardiovascular: Positive for chest pain. Negative for palpitations, orthopnea, leg swelling and PND.  Gastrointestinal: Positive for blood in stool and melena. Negative for abdominal pain, constipation, diarrhea, nausea and vomiting.  Genitourinary: Negative for dysuria, frequency, hematuria and urgency.  Musculoskeletal: Positive for back pain. Negative for falls.  Neurological: Positive for weakness. Negative for dizziness, tremors, focal weakness and headaches.  Endo/Heme/Allergies: Does not bruise/bleed easily.  Psychiatric/Behavioral: Negative for depression. The patient does not have insomnia.     VITAL SIGNS: Blood pressure (!) 156/61, pulse 92, temperature 98.1 F (36.7 C), temperature source Oral, resp. rate (!) 22, height 5\' 7"  (1.702 m), weight 123.9 kg (273 lb 1.6 oz), SpO2 100 %.  PHYSICAL EXAMINATION:   GENERAL:  66 y.o.-year-old patient lying in the bed with no acute distress. Comfortable, happy, joking  EYES: Pupils equal, round, reactive to light and accommodation. No scleral icterus. Extraocular muscles intact.  HEENT: Head atraumatic, normocephalic. Oropharynx and nasopharynx clear.  NECK:  Supple, no jugular venous distention. No thyroid enlargement, no tenderness.  LUNGS: Normal breath sounds bilaterally, no wheezing,  rales,rhonchi  or crepitation. No use of accessory muscles of respiration.  CARDIOVASCULAR: S1, S2 normal. No murmurs, rubs, or gallops.  ABDOMEN: Soft, nontender, nondistended. Bowel sounds present. No organomegaly or mass.  EXTREMITIES: No pedal edema, cyanosis, or clubbing.  NEUROLOGIC: Cranial nerves II through XII are intact. Muscle strength 5/5 in all extremities. Sensation intact. Gait not checked.  PSYCHIATRIC: The patient is alert and oriented x 3.  SKIN: No obvious rash, lesion, or ulcer.   ORDERS/RESULTS REVIEWED:   CBC  Recent Labs Lab 05/03/17 0158  05/04/17 0342  05/04/17 1917 05/05/17 0336 05/05/17 1702 05/06/17 0841 05/06/17 1625 05/07/17 0410 05/07/17 1131  WBC 18.7*  --  14.1*  --   --  14.1*  --   --   --  15.9*  --   HGB 5.3*  < > 6.9*  < > 8.8* 8.1* 7.3* 5.6* 5.2* 7.8* 8.3*  HCT 15.6*  < > 20.2*  < > 25.9* 23.9*  --   --  15.5* 23.0* 24.9*  PLT 290  --  221  --   --  220  --   --   --  220  --   MCV 93.8  --  94.4  --   --  92.8  --   --   --  90.1  --   MCH 32.1  --  32.2  --   --  31.6  --   --   --  30.5  --   MCHC 34.2  --  34.1  --   --  34.0  --   --   --  33.9  --   RDW 15.5*  --  15.9*  --   --  16.4*  --   --   --  17.5*  --   < > = values in this interval not displayed. ------------------------------------------------------------------------------------------------------------------  Chemistries   Recent Labs Lab 05/03/17 0158 05/03/17 0949 05/04/17 0342 05/05/17 0336 05/07/17 0410  NA 134* 135 139 139 136  K 4.0 4.3 3.9 3.8 3.5  CL 100* 102 106 106 109  CO2 26 24 27 26 25   GLUCOSE 177* 130* 112* 145* 114*  BUN 43* 37* 26* 19 16  CREATININE 1.29* 1.14* 1.06* 0.95 0.79  CALCIUM 8.4* 8.5* 7.6* 7.4* 7.0*  AST 23  --   --   --   --   ALT 15  --   --   --   --   ALKPHOS 74  --   --   --   --   BILITOT 0.5  --   --   --   --     ------------------------------------------------------------------------------------------------------------------ estimated creatinine clearance is 98.3 mL/min (by C-G formula based on SCr of 0.79 mg/dL). ------------------------------------------------------------------------------------------------------------------ No results for input(s): TSH, T4TOTAL, T3FREE, THYROIDAB in the last 72 hours.  Invalid input(s): FREET3  Cardiac Enzymes  Recent Labs Lab 05/03/17 0158  TROPONINI <0.03   ------------------------------------------------------------------------------------------------------------------ Invalid input(s): POCBNP ---------------------------------------------------------------------------------------------------------------  RADIOLOGY: Nm Gi Blood Loss  Result Date: 05/06/2017 CLINICAL DATA:  Read bloody stool episodes for 1 week, colonoscopy 04/30/2017, upper endoscopy 05/03/2017, acute post hemorrhagic anemia, patient receiving blood during scan. EXAM: NUCLEAR MEDICINE GASTROINTESTINAL BLEEDING SCAN TECHNIQUE: Sequential abdominal images were obtained following intravenous administration of Tc-74m labeled red blood cells. RADIOPHARMACEUTICALS:  22.8 mCi Tc-22m in-vitro labeled red cells. COMPARISON:  None. FINDINGS: No abnormal radiotracer identified that would confirm active GI bleed. IMPRESSION: No active GI bleeding site identified. Electronically Signed   By: Weyman Croon  Linde Gillis M.D.   On: 05/06/2017 14:31    EKG:  Orders placed or performed during the hospital encounter of 05/03/17  . EKG 12-Lead  . EKG 12-Lead    ASSESSMENT AND PLAN:  Active Problems:   GI bleed   Acute posthemorrhagic anemia   History of recent blood transfusion   Hyponatremia   Dehydration   Acute renal insufficiency   Leukocytosis   Diarrhea   Anemia  #1. Acute post hemorrhagic anemia, Status post numerous units of packed red blood cell transfusion during this admission, follow hemoglobin  level  closely, bleeding scan was negative, blood pressure has improved after IV fluid administration, transfusion, remains stable at present, patient is to continue clear liquid diet. Patient was seen by Dr. Tobi Bastos, underwent capsule endoscopy May 17 and underwent EGD and capsule placement directly to small bowel May 18, results are pending. Hemoglobin level is stable. Marland Kitchen     #2. Gastrointestinal bleed, acute, black and bright red blood, status post recent colonoscopy which revealed small internal nonbleeding hemorrhoids, status post EGD 05/03/2017, unremarkable, tagged red blood cell scan was  negative, capsule endoscopy study 05/08/2017 was incomplete, status post repeated  EGD and deployment of capsule straight to Jejunum 05/09/2017 by Dr. Tobi Bastos, results are pending .    #3 Hyponatremia, resolved with IV fluid administration, likely intravascular depletion related , now off IV fluids   #4 acute renal insufficiency, , resolved with transfusion, IV fluids, , follow creatinine intermittently   #5 Leukocytosis, etiology is unclear, no obvious infection, ?stress/transfusion related,  follow white blood cell count in has low-grade temperature today. Check UA.  #6 diarrhea, C. difficile and stool gastrointestinal panel were negative, suspect melena, supportive therapy with Imodium when necessary, patient is on clear liquid diet now, diarrhea has subsided   Management plans discussed with the patient, family and they are in agreement.   DRUG ALLERGIES:  Allergies  Allergen Reactions  . Other Shortness Of Breath    caffergot  . Penicillins   . Seroquel [Quetiapine Fumarate] Other (See Comments)    hallucinations    CODE STATUS:     Code Status Orders        Start     Ordered   05/03/17 0526  Full code  Continuous     05/03/17 0525    Code Status History    Date Active Date Inactive Code Status Order ID Comments User Context   04/28/2017  3:02 PM 04/30/2017  7:04 PM Full Code [id number]   Altamese Dilling, MD Inpatient    Advance Directive Documentation     Most Recent Value  Type of Advance Directive  Healthcare Power of Attorney  Pre-existing out of facility DNR order (yellow form or pink MOST form)  -  "MOST" Form in Place?  -      TOTAL TIME TAKING CARE OF THIS PATIENT: 30 minutes.   Katharina Caper M.D on 05/07/2017 at 2:54 PM  Between 7am to 6pm - Pager - [phone]  After 6pm go to [email or web address] - password [email or web address] [facility]  [place]  [facility]  Office  [phone]  CC: Primary care physician; Clovis Pu, Arturo Morton, DO

## 2017-05-11 LAB — BASIC METABOLIC PANEL
Anion gap: 7 (ref 5–15)
BUN: 6 mg/dL (ref 6–20)
CO2: 27 mmol/L (ref 22–32)
Calcium: 7.6 mg/dL — ABNORMAL LOW (ref 8.9–10.3)
Chloride: 106 mmol/L (ref 101–111)
Creatinine, Ser: 0.94 mg/dL (ref 0.44–1.00)
GFR calc Af Amer: >60 mL/min (ref >60)
GFR calc non Af Amer: >60 mL/min (ref >60)
Glucose, Bld: 123 mg/dL — ABNORMAL HIGH (ref 65–99)
Potassium: 3.1 mmol/L — ABNORMAL LOW (ref 3.5–5.1)
Sodium: 140 mmol/L (ref 135–145)

## 2017-05-11 LAB — URINALYSIS, ROUTINE W REFLEX MICROSCOPIC
Bilirubin Urine: NEGATIVE
Glucose, UA: NEGATIVE mg/dL
Hgb urine dipstick: NEGATIVE
Ketones, ur: NEGATIVE mg/dL
Leukocytes, UA: NEGATIVE
Nitrite: NEGATIVE
Protein, ur: NEGATIVE mg/dL
Specific Gravity, Urine: 1.008 (ref 1.005–1.030)
pH: 6 (ref 5.0–8.0)

## 2017-05-11 LAB — CBC
HCT: 24.2 % — ABNORMAL LOW (ref 35.0–47.0)
Hemoglobin: 8 g/dL — ABNORMAL LOW (ref 12.0–16.0)
MCH: 30.4 pg (ref 26.0–34.0)
MCHC: 32.9 g/dL (ref 32.0–36.0)
MCV: 92.5 fL (ref 80.0–100.0)
Platelets: 361 10*3/uL (ref 150–440)
RBC: 2.62 MIL/uL — ABNORMAL LOW (ref 3.80–5.20)
RDW: 21.1 % — ABNORMAL HIGH (ref 11.5–14.5)
WBC: 11.6 10*3/uL — ABNORMAL HIGH (ref 3.6–11.0)

## 2017-05-11 MED ORDER — POTASSIUM CHLORIDE CRYS ER 20 MEQ PO TBCR
40.0000 meq | EXTENDED_RELEASE_TABLET | ORAL | Status: AC
  Administered 2017-05-11 (×2): 40 meq via ORAL
  Filled 2017-05-11 (×2): qty 2

## 2017-05-11 NOTE — Progress Notes (Signed)
[facility] - [place] at [facility]   PATIENT NAME: Jacqueline Williams    MR#:  [id number]  DATE OF BIRTH:  [birth year]  SUBJECTIVE: She denies any black or red stool. She had normal BM this morning. No abdominal pain. Wants to eat solid food. Hemoglobin stable at  8.  CHIEF COMPLAINT:   Chief Complaint  Patient presents with  . Weakness   The patient is 66 year old Caucasian female with past medical history significant for history of chronic back pain, hypothyroidism, who presented to the hospital with complaints of generalized weakness and fatigue, multiple episodes of rectal bleeding. She admitted of back pain as well as chest pain. On arrival to emergency room, her hemoglobin level was found to be 5.3. Apparently, patient had colonoscopy few days ago, which revealed hemorrhoids. No nausea, vomiting. No hematemesis was noted. Patient was admitted to the hospital for further evaluation, treatment, she was transfused 3 units of  packed red blood cells after which hemoglobin level improved to 8.1 today. She feels overall better , no more bleeding, no diarrheal stool. Patient continues to have some diarrheal stool, stool cultures were negative, including C. difficile. She underwent EGD 05/03/2017. The patient was initiated on soft diet, however, that rebled, had black stool and the hemoglobin level dropped down significantly earlier today. Tachycardic, but not hypotensive, is receiving blood transfusion. She underwent Physical Bleeding Scan, Which Was Negative. I Discussed Patient's Case with Dr. Tobi Bastos, Who Recommended capsule endoscopy. Patient had numerous black bowel movements ,  bright red blood with clots. Physical. Last black bowel movement 05/06/2017 at around 2 PM. Patient was transfused packed red blood cells, given IV fluid bolus, her blood pressure, heart rate, hemoglobin level has improved. She feels comfortable today. Denies any pain, denies any shortness of breath,  swelling. The patient underwent capsule endoscopy, however, it was complicated by capsule state in the stomach for 4 hours and 17 minutes, it wasn't complete, repeated EGD Today, 05/09/2017 by Dr. Tobi Bastos and the deployment of the capsule directly into the jejunum was accomplished without difficulty, EGD was unremarkable.    ,  Constitutional: Negative for chills, fever and weight loss.  HENT: Negative for congestion.   Eyes: Negative for blurred vision and double vision.  Respiratory: Negative for cough, sputum production, shortness of breath and wheezing.   Cardiovascular: Positive for chest pain. Negative for palpitations, orthopnea, leg swelling and PND.  Gastrointestinal: Positive for blood in stool and melena. Negative for abdominal pain, constipation, diarrhea, nausea and vomiting.  Genitourinary: Negative for dysuria, frequency, hematuria and urgency.  Musculoskeletal: Positive for back pain. Negative for falls.  Neurological: Positive for weakness. Negative for dizziness, tremors, focal weakness and headaches.  Endo/Heme/Allergies: Does not bruise/bleed easily.  Psychiatric/Behavioral: Negative for depression. The patient does not have insomnia.    05/11/2017;Patient denies any chest pain, blood in stool, back pain, weakness.  VITAL SIGNS: Blood pressure (!) 156/61, pulse 92, temperature 98.1 F (36.7 C), temperature source Oral, resp. rate (!) 22, height 5\' 7"  (1.702 m), weight 123.9 kg (273 lb 1.6 oz), SpO2 100 %.  PHYSICAL EXAMINATION:   GENERAL:  66 y.o.-year-old patient lying in the bed with no acute distress. Comfortable, happy, joking  EYES: Pupils equal, round, reactive to light and accommodation. No scleral icterus. Extraocular muscles intact.  HEENT: Head atraumatic, normocephalic. Oropharynx and nasopharynx clear.  NECK:  Supple, no jugular venous distention. No thyroid enlargement, no tenderness.  LUNGS: Normal breath sounds bilaterally, no wheezing, rales,rhonchi or  crepitation. No use of accessory muscles of respiration.  CARDIOVASCULAR: S1, S2 normal. No murmurs, rubs, or gallops.  ABDOMEN: Soft, nontender, nondistended. Bowel sounds present. No organomegaly or mass.  EXTREMITIES: No pedal edema, cyanosis, or clubbing.  NEUROLOGIC: Cranial nerves II through XII are intact. Muscle strength 5/5 in all extremities. Sensation intact. Gait not checked.  PSYCHIATRIC: The patient is alert and oriented x 3.  SKIN: No obvious rash, lesion, or ulcer.   ORDERS/RESULTS REVIEWED:   CBC  Recent Labs Lab 05/03/17 0158  05/04/17 0342  05/04/17 1917 05/05/17 0336 05/05/17 1702 05/06/17 0841 05/06/17 1625 05/07/17 0410 05/07/17 1131  WBC 18.7*  --  14.1*  --   --  14.1*  --   --   --  15.9*  --   HGB 5.3*  < > 6.9*  < > 8.8* 8.1* 7.3* 5.6* 5.2* 7.8* 8.3*  HCT 15.6*  < > 20.2*  < > 25.9* 23.9*  --   --  15.5* 23.0* 24.9*  PLT 290  --  221  --   --  220  --   --   --  220  --   MCV 93.8  --  94.4  --   --  92.8  --   --   --  90.1  --   MCH 32.1  --  32.2  --   --  31.6  --   --   --  30.5  --   MCHC 34.2  --  34.1  --   --  34.0  --   --   --  33.9  --   RDW 15.5*  --  15.9*  --   --  16.4*  --   --   --  17.5*  --   < > = values in this interval not displayed. ------------------------------------------------------------------------------------------------------------------  Chemistries   Recent Labs Lab 05/03/17 0158 05/03/17 0949 05/04/17 0342 05/05/17 0336 05/07/17 0410  NA 134* 135 139 139 136  K 4.0 4.3 3.9 3.8 3.5  CL 100* 102 106 106 109  CO2 26 24 27 26 25   GLUCOSE 177* 130* 112* 145* 114*  BUN 43* 37* 26* 19 16  CREATININE 1.29* 1.14* 1.06* 0.95 0.79  CALCIUM 8.4* 8.5* 7.6* 7.4* 7.0*  AST 23  --   --   --   --   ALT 15  --   --   --   --   ALKPHOS 74  --   --   --   --   BILITOT 0.5  --   --   --   --    ------------------------------------------------------------------------------------------------------------------ estimated  creatinine clearance is 98.3 mL/min (by C-G formula based on SCr of 0.79 mg/dL). ------------------------------------------------------------------------------------------------------------------ No results for input(s): TSH, T4TOTAL, T3FREE, THYROIDAB in the last 72 hours.  Invalid input(s): FREET3  Cardiac Enzymes  Recent Labs Lab 05/03/17 0158  TROPONINI <0.03   ------------------------------------------------------------------------------------------------------------------ Invalid input(s): POCBNP ---------------------------------------------------------------------------------------------------------------  RADIOLOGY: Nm Gi Blood Loss  Result Date: 05/06/2017 CLINICAL DATA:  Read bloody stool episodes for 1 week, colonoscopy 04/30/2017, upper endoscopy 05/03/2017, acute post hemorrhagic anemia, patient receiving blood during scan. EXAM: NUCLEAR MEDICINE GASTROINTESTINAL BLEEDING SCAN TECHNIQUE: Sequential abdominal images were obtained following intravenous administration of Tc-3863m labeled red blood cells. RADIOPHARMACEUTICALS:  22.8 mCi Tc-7763m in-vitro labeled red cells. COMPARISON:  None. FINDINGS: No abnormal radiotracer identified that would confirm active GI bleed. IMPRESSION: No active GI bleeding site identified. Electronically Signed   By: Bary RichardStan  Maynard  M.D.   On: 05/06/2017 14:31    EKG:  Orders placed or performed during the hospital encounter of 05/03/17  . EKG 12-Lead  . EKG 12-Lead    ASSESSMENT AND PLAN:  Active Problems:   GI bleed   Acute posthemorrhagic anemia   History of recent blood transfusion   Hyponatremia   Dehydration   Acute renal insufficiency   Leukocytosis   Diarrhea   Anemia  #1. Acute post hemorrhagic anemia, Status post numerous units of packed red blood cell transfusion during this admission, follow hemoglobin level  closely, bleeding scan was negative, blood pressure has improved after IV fluid administration, transfusion, remains  stable at present, . Patient was seen by Dr. Tobi Bastos, underwent capsule endoscopy May 17 and underwent EGD and capsule placement directly to small bowel May 18, results are pending. Hemoglobin level is stable. Marland Kitchen     #2. Gastrointestinal bleed, acute, black and bright red blood, status post recent colonoscopy which revealed small internal nonbleeding hemorrhoids, status post EGD 05/03/2017, unremarkable, tagged red blood cell scan was  negative, capsule endoscopy study 05/08/2017 was incomplete, status post repeated  EGD and deployment of capsule straight to Jejunum 05/09/2017 by Dr. Tobi Bastos, results are pending .    #3 Hyponatremia, resolved with IV fluid administration, likely intravascular depletion related , now off IV fluids   #4 acute renal insufficiency, , resolved with transfusion, IV fluids, , follow creatinine intermittently   #5 Leukocytosis, etiology is unclear, no obvious infection, ?stress/transfusion related,  follow white blood cell count in has low-grade temperature today. Check UA.  #6 diarrhea, C. difficile and stool gastrointestinal panel were negative, suspect melena, supportive therapy with Imodium when necessary, patient is on clear liquid diet now, diarrhea has subsided   Management plans discussed with the patient, family and they are in agreement.   DRUG ALLERGIES:  Allergies  Allergen Reactions  . Other Shortness Of Breath    caffergot  . Penicillins   . Seroquel [Quetiapine Fumarate] Other (See Comments)    hallucinations    CODE STATUS:     Code Status Orders        Start     Ordered   05/03/17 0526  Full code  Continuous     05/03/17 0525    Code Status History    Date Active Date Inactive Code Status Order ID Comments User Context   04/28/2017  3:02 PM 04/30/2017  7:04 PM Full Code [id number]  Altamese Dilling, MD Inpatient    Advance Directive Documentation     Most Recent Value  Type of Advance Directive  Healthcare Power of Attorney    Pre-existing out of facility DNR order (yellow form or pink MOST form)  -  "MOST" Form in Place?  -      TOTAL TIME TAKING CARE OF THIS PATIENT: 30 minutes.   Katharina Caper M.D on 05/07/2017 at 2:54 PM  Between 7am to 6pm - Pager - [phone]  After 6pm go to [email or web address] - password [email or web address] [facility]  [place]  Hospitalists  Office  [phone]  CC: Primary care physician; Clovis Pu, Arturo Morton, DO

## 2017-05-12 NOTE — Care Management Important Message (Signed)
Important Message  Patient Details  Name: Milus MallickKay W Rohman MRN: [id number] Date of Birth: Jul 01, 1951   Medicare Important Message Given:  Yes    Chapman FitchBOWEN, Sabre Romberger T, RN 05/12/2017, 12:08 PM

## 2017-05-12 NOTE — Progress Notes (Signed)
Wyline Mood MD [address], [address] [place], [place] [address] Phone: [phone] Fax : [phone]  Jacqueline Williams is being followed for GI bleed  Subjective: No further black or bloody stool .    Objective: Vital signs in last 24 hours: Vitals:   05/11/17 1938 05/12/17 0410 05/12/17 1035 05/12/17 1040  BP: (!) 143/58 (!) 111/52    Pulse: 92 87 (!) 125 93  Resp: 18 20 (!) 32   Temp: 98.2 F (36.8 C) 98.2 F (36.8 C)    TempSrc: Oral Oral    SpO2: 94% 94%    Weight:      Height:       Weight change:   Intake/Output Summary (Last 24 hours) at 05/12/17 1138 Last data filed at 05/12/17 1006  Gross per 24 hour  Intake              480 ml  Output             1500 ml  Net            -1020 ml     Exam: Heart:: Regular rate and rhythm or S1S2 present Lungs: clear to auscultation and clear to auscultation and percussion Abdomen: soft, nontender, normal bowel sounds   Lab Results: @LABTEST2 @ Micro Results: Recent Results (from the past 240 hour(s))  Gastrointestinal Panel by PCR , Stool     Status: None   Collection Time: 05/03/17  4:22 PM  Result Value Ref Range Status   Campylobacter species NOT DETECTED NOT DETECTED Final   Plesimonas shigelloides NOT DETECTED NOT DETECTED Final   Salmonella species NOT DETECTED NOT DETECTED Final   Yersinia enterocolitica NOT DETECTED NOT DETECTED Final   Vibrio species NOT DETECTED NOT DETECTED Final   Vibrio cholerae NOT DETECTED NOT DETECTED Final   Enteroaggregative E coli (EAEC) NOT DETECTED NOT DETECTED Final   Enteropathogenic E coli (EPEC) NOT DETECTED NOT DETECTED Final   Enterotoxigenic E coli (ETEC) NOT DETECTED NOT DETECTED Final   Shiga like toxin producing E coli (STEC) NOT DETECTED NOT DETECTED Final   Shigella/Enteroinvasive E coli (EIEC) NOT DETECTED NOT DETECTED Final   Cryptosporidium NOT DETECTED NOT DETECTED Final   Cyclospora cayetanensis NOT DETECTED NOT DETECTED Final   Entamoeba histolytica NOT  DETECTED NOT DETECTED Final   Giardia lamblia NOT DETECTED NOT DETECTED Final   Adenovirus F40/41 NOT DETECTED NOT DETECTED Final   Astrovirus NOT DETECTED NOT DETECTED Final   Norovirus GI/GII NOT DETECTED NOT DETECTED Final   Rotavirus A NOT DETECTED NOT DETECTED Final   Sapovirus (I, II, IV, and V) NOT DETECTED NOT DETECTED Final  C difficile quick scan w PCR reflex     Status: None   Collection Time: 05/03/17  4:22 PM  Result Value Ref Range Status   C Diff antigen NEGATIVE NEGATIVE Final   C Diff toxin NEGATIVE NEGATIVE Final   C Diff interpretation No C. difficile detected.  Final  MRSA PCR Screening     Status: None   Collection Time: 05/06/17  8:42 PM  Result Value Ref Range Status   MRSA by PCR NEGATIVE NEGATIVE Final    Comment:        The GeneXpert MRSA Assay (FDA approved for NASAL specimens only), is one component of a comprehensive MRSA colonization surveillance program. It is not intended to diagnose MRSA infection nor to guide or monitor treatment for MRSA infections.    Studies/Results: No results found. Medications: I have reviewed the  patient's current medications. Scheduled Meds: . feeding supplement  1 Container Oral TID BM  . ferrous gluconate  324 mg Oral Q breakfast  . levothyroxine  25 mcg Oral QAC breakfast  . magnesium oxide  400 mg Oral Daily  . multivitamin with minerals  1 tablet Oral Daily  . oxybutynin  5 mg Oral BID  . pantoprazole (PROTONIX) IV  40 mg Intravenous Q12H  . risperiDONE  4 mg Oral QHS  . traZODone  100 mg Oral QHS  . venlafaxine XR  150 mg Oral QHS   Continuous Infusions: PRN Meds:.acetaminophen **OR** acetaminophen, fentaNYL (SUBLIMAZE) injection, fluticasone, loperamide, LORazepam, ondansetron **OR** ondansetron (ZOFRAN) IV, ondansetron (ZOFRAN) IV, oxyCODONE-acetaminophen   Assessment: Active Problems:   GI bleed   Acute posthemorrhagic anemia   History of recent blood transfusion   Hyponatremia   Dehydration    Acute renal insufficiency   Leukocytosis   Diarrhea   Anemia  Jacqueline MallickKay W Romm is a 66 y.o. female was admitted initially on 04/28/17-04/30/17 and I was consulted to see her and at that time I felt she had a diverticular bleed and it had stopped at the time of her colonoscopy on 04/30/18 showed non bleeding diverticuli. She was discharged and again admitted on 05/03/17 and was seen by GI Dr Orvan FalconerBeavers for rectal bleeding who then performed an EGD on 05/03/17 and during the hospitalization . While awaiting discharge noted further drop in HB with no overt blood loss. 1 episode of black stool small in qty , has had multiple units of PRBC. Capsule study done on 05/07/17  The capsule stayed in her stomach for 4 hours and 17 mins and moved to her jejunum at that point. At the 4 hour 30 mins mark I could see some blood which I would assume since it was bright red is from a lesion bleeding in the vicinity .Marland Kitchen. Since the capsule spent an abnormal amount of time in the stomach , we ran out of recording time at the end of the study at 7 hours and 30 mins and the capsule was only probably in the mid small bowel and hence incomplete. Repeated push enteroscopy and no bleeding seen on 05/09/17, repeat capsule placed endoscopically in the jejunum, again unfortunately the study was incomplete likely reached distal small bowel. No active bleeding seen , Possible one very tiny non bleeding AVM at the 1 hour 31 min mark. Discussed results with patient and daughter in detail. Since not having any black stool and HB stable should go home. Still very likely a diverticular bleed.   Plan:  1. Check CBC with PCP in 3 days  2 . Outpatient GI follow up- options are watch and see how she does vs attempt 12 hour capsule [facility]  3. If she has active bleed in the meanwhile and comes into the ER to get stat tagged RBC scan in the ER to catch the source of bleed when active 4. No NSAID's   I will sign off.  Please call me if  any further GI concerns or questions.  We would like to thank you for the opportunity to participate in the care of Jacqueline Williams.     LOS: 9 days   Wyline MoodKiran Ronie Fleeger 05/12/2017, 11:38 AM

## 2017-05-12 NOTE — Discharge Instructions (Signed)
Please return to ED if you notice blood in stool or Black stools  You will need a Tagged RBC scan to find out where you are bleeding from if there is bleeding in the future.

## 2017-05-12 NOTE — Evaluation (Signed)
Physical Therapy Evaluation Patient Details Name: Jacqueline Williams MRN: [id number] DOB: [birth year] Today's Date: 05/12/2017   History of Present Illness  Jacqueline Williams is a 65yo white female who comes to [facility] for endoscopy related to GIB. At baseline she has anemia, but now worse, arriving with Hb: 5.3. Pt [facility] in apartment at [facility] x7 years, AMB limited community distances with rollator walker, and daughter assists with transportation when needed.   Clinical Impression  Pt admitted with above diagnosis. Pt currently with functional limitations due to the deficits listed below (see "PT Problem List"). Pt will baseline limitations to community distance AMB, now with difficulty performing household distance AMB with AD. The pt presenting with typical activity limitation associated with typical presentation of anemia, including increasing tachycardia, tachypnea, and dizziness with activity:pt also presenting with pallor. At eval most recent labs: Hb: 8.0, HCT: 24.2, K+: 3.1. Pt will benefit from skilled PT intervention to increase independence and safety with basic mobility in preparation for discharge to the venue listed below.       Follow Up Recommendations Home health PT (preexisting order for HHPT 10D PTA)    Equipment Recommendations  None recommended by PT    Recommendations for Other Services       Precautions / Restrictions Precautions Precautions: Fall      Mobility  Bed Mobility Overal bed mobility: Modified Independent                Transfers Overall transfer level: Needs assistance Equipment used: Rolling walker (2 wheeled) Transfers: Sit to/from Stand Sit to Stand: Supervision            Ambulation/Gait Ambulation/Gait assistance: Min guard Ambulation Distance (Feet): 90 Feet Assistive device: Rolling walker (2 wheeled)     Gait velocity interpretation: <1.8 ft/sec, indicative of risk for recurrent falls General Gait Details: Pt asks to sit; SOB  with RR:32, HR:125.   Stairs            Wheelchair Mobility    Modified Rankin (Stroke Patients Only)       Balance                                             Pertinent Vitals/Pain Pain Assessment: No/denies pain    Home Living Family/patient expects to be discharged to:: Private residence Living Arrangements: Alone Available Help at Discharge: Family (daughter) Type of Home: Apartment Home Access:  (handicapped accessible)     Home Layout: One level Home Equipment: Walker - 4 wheels      Prior Function Level of Independence: Independent with assistive device(s)         Comments: limited community distance AMB; rollator used x 6 years.     Hand Dominance        Extremity/Trunk Assessment                Communication   Communication: No difficulties  Cognition Arousal/Alertness: Awake/alert Behavior During Therapy: WFL for tasks assessed/performed Overall Cognitive Status: Within Functional Limits for tasks assessed                                        General Comments      Exercises     Assessment/Plan    PT Assessment Patient needs  continued PT services  PT Problem List Decreased strength;Decreased activity tolerance;Decreased mobility;Cardiopulmonary status limiting activity       PT Treatment Interventions Gait training;Functional mobility training;Therapeutic exercise    PT Goals (Current goals can be found in the Care Plan section)  Acute Rehab PT Goals Patient Stated Goal: improve functional activity tolerance.  PT Goal Formulation: With patient Time For Goal Achievement: 05/26/17 Potential to Achieve Goals: Good    Frequency Min 2X/week   Barriers to discharge Decreased caregiver support long walk to dinning hall    Co-evaluation               AM-PAC PT "6 Clicks" Daily Activity  Outcome Measure Difficulty turning over in bed (including adjusting bedclothes, sheets and  blankets)?: A Little Difficulty moving from lying on back to sitting on the side of the bed? : A Little Difficulty sitting down on and standing up from a chair with arms (e.g., wheelchair, bedside commode, etc,.)?: A Little Help needed moving to and from a bed to chair (including a wheelchair)?: A Little Help needed walking in hospital room?: A Lot Help needed climbing 3-5 steps with a railing? : A Lot 6 Click Score: 16    End of Session Equipment Utilized During Treatment: Gait belt Activity Tolerance: Patient tolerated treatment well;Patient limited by fatigue Patient left: in bed;with call bell/phone within reach;with bed alarm set Nurse Communication: Mobility status PT Visit Diagnosis: Difficulty in walking, not elsewhere classified (R26.2);Dizziness and giddiness (R42)    Time: [id number] PT Time Calculation (min) (ACUTE ONLY): 15 min   Charges:   PT Evaluation $PT Eval Low Complexity: 1 Procedure     PT G Codes:        10:57 AM, 05/12/17 Rosamaria LintsAllan C Anona Giovannini, PT, DPT Physical Therapist - [place] [phone] (ASCOM)  [phone] (mobile)   Rozella Servello C 05/12/2017, 10:54 AM

## 2017-05-12 NOTE — Progress Notes (Signed)
Patient discharge teaching given, including activity, diet, follow-up appoints, and medications. Patient verbalized understanding of all discharge instructions. IV access was d/c'd. Vitals are stable. Skin is intact except as charted in most recent assessments. Pt to be escorted out by NT, to be driven home by family.  Story Vanvranken  

## 2017-05-12 NOTE — Discharge Summary (Signed)
[facility] [place] [facility]   PATIENT NAME: Jacqueline Williams    MR#:  [id number]  DATE OF BIRTH:  August 28, 1951  DATE OF ADMISSION:  05/03/2017 ADMITTING PHYSICIAN: Ihor Austin, MD  DATE OF DISCHARGE: 05/12/2017  PRIMARY CARE PHYSICIAN: Clovis Pu, Arturo Morton, DO   ADMISSION DIAGNOSIS:  Gastrointestinal hemorrhage, unspecified gastrointestinal hemorrhage type [K92.2] Anemia, unspecified type [D64.9] Chest pain, unspecified type [R07.9]  DISCHARGE DIAGNOSIS:  Active Problems:   GI bleed   Acute posthemorrhagic anemia   History of recent blood transfusion   Hyponatremia   Dehydration   Acute renal insufficiency   Leukocytosis   Diarrhea   Anemia   SECONDARY DIAGNOSIS:   Past Medical History:  Diagnosis Date  . Back pain   . Depression   . Thyroid disease      ADMITTING HISTORY  HISTORY OF PRESENT ILLNESS: Jacqueline Williams  is a 66 y.o. female with a known history of Chronic back pain, hypothyroidism presented to the emergency room with generalized weakness and fatigue. She also experienced multiple episodes of rectal bleed since Monday that prompted her to come to the emergency room. Has some back pain and chest discomfort. Hemoglobin was around 5.3 when she was evaluated in the emergency room. Patient recently had colonoscopy on Wednesday which showed hemorrhoids. No vomiting of blood. No coughing of blood. No abdominal pain nausea and vomiting. Hospitalist service was consulted for further care of the patient.  HOSPITAL COURSE:   * GI bleed with acute blood  loss anemia Patient received transfusion of multiple units of packed RBC. Bleeding scan was negative. Patient underwent EGD and colonoscopy. Capsule endoscopy was done and showed no bleeding. Per GI she was thought to have diverticular bleeding which has resolved. She is being started on iron supplements. No aspirin. Follow-up with Dr. Tobi Bastos in 1 week.  Patient was seen by physical therapy. Home health  physical therapy has been set up.  She will need follow-up hemoglobin in 3 days.  Hemoglobin has remained stable between 7.8 to 8 the last 3 days of hospital stay.   Dr. Johnney Killian note from day of discharge  Jacqueline Williams is a 66 y.o. female was admitted initially on 04/28/17-04/30/17 and I was consulted to see her and at that time I felt she had a diverticular bleed and it had stopped at the time of her colonoscopy on 04/30/18 showed non bleeding diverticuli. She was discharged and again admitted on 05/03/17 and was seen by GI Dr Orvan Falconer for rectal bleeding who then performed an EGD on 05/03/17 and during the hospitalization . While awaiting discharge noted further drop in HB with no overt blood loss. 1 episode of black stool small in qty , has had multiple units of PRBC. Capsule study done on 05/07/17  The capsule stayed in her stomach for 4 hours and 17 mins and moved to her jejunum at that point. At the 4 hour 30 mins mark I could see some blood which I would assume since it was bright red is from a lesion bleeding in the vicinity .Marland Kitchen Since the capsule spentan abnormal amount of time in the stomach , we ran out of recording time at the end of the study at 7 hours and 30 mins and the capsule was only probably in the mid small bowel and hence incomplete. Repeated push enteroscopy and no bleeding seen on 05/09/17, repeat capsule placed endoscopically in the jejunum, again unfortunately the study was incomplete likely reached distal small bowel.  No active bleeding seen , Possible one very tiny non bleeding AVM at the 1 hour 31 min mark. Discussed results with patient and daughter in detail. Since not having any black stool and HB stable should go home. Still very likely a diverticular bleed.   Plan:  1. Check CBC with PCP in 3 days  2 . Outpatient GI follow up- options are watch and see how she does vs attempt 12 hour capsule study vs enteroscopy at [facility]  3. If she has active bleed in the meanwhile and comes  into the ER to get stat tagged RBC scan in the ER to catch the source of bleed when active 4. No NSAID's   CONSULTS OBTAINED:  Treatment Team:  Tressia Danas, MD Katha Hamming, MD  DRUG ALLERGIES:   Allergies  Allergen Reactions  . Other Shortness Of Breath    caffergot  . Penicillins   . Seroquel [Quetiapine Fumarate] Other (See Comments)    hallucinations    DISCHARGE MEDICATIONS:   Current Discharge Medication List    CONTINUE these medications which have CHANGED   Details  ferrous gluconate (FERGON) 324 MG tablet Take 1 tablet (324 mg total) by mouth 3 (three) times daily with meals. Qty: 90 tablet, Refills: 3      CONTINUE these medications which have NOT CHANGED   Details  acetaminophen (TYLENOL) 500 MG tablet Take 500 mg by mouth every 6 (six) hours as needed.    calcium-vitamin D (OSCAL WITH D) 500-200 MG-UNIT tablet Take 1 tablet by mouth.    cetirizine (ZYRTEC) 10 MG tablet Take 10 mg by mouth daily.    cyanocobalamin (,VITAMIN B-12,) 1000 MCG/ML injection Inject 1 mL into the skin every 30 (thirty) days.    fluticasone (FLONASE) 50 MCG/ACT nasal spray Place 1 spray into both nostrils daily as needed for allergies or rhinitis.    lactase (LACTAID) 3000 units tablet Take by mouth 3 (three) times daily with meals.    levothyroxine (SYNTHROID, LEVOTHROID) 25 MCG tablet Take 25 mcg by mouth daily.    lipase/protease/amylase (CREON) 12000 units CPEP capsule Take 24,000 Units by mouth 3 (three) times daily before meals.    loperamide (IMODIUM) 2 MG capsule Take by mouth as needed for diarrhea or loose stools.    LORazepam (ATIVAN) 1 MG tablet Take 0.5-1 mg by mouth 4 (four) times daily. 0.5 mg 0630,  1 mg 1100, 0.5 mg 1600, 1 mg 2030    oxybutynin (DITROPAN) 5 MG tablet Take 5 mg by mouth 2 (two) times daily.    oxyCODONE-acetaminophen (PERCOCET) 5-325 MG tablet Take 1 tablet by mouth 4 (four) times daily. 0630,1100,1600,2030    pantoprazole  (PROTONIX) 40 MG tablet Take 40 mg by mouth daily.    promethazine (PHENERGAN) 25 MG tablet Take 25 mg by mouth every 6 (six) hours as needed.    risperidone (RISPERDAL) 4 MG tablet Take 4 mg by mouth daily.    simethicone (MYLICON) 80 MG chewable tablet Chew 80 mg by mouth every 6 (six) hours as needed for flatulence.    traZODone (DESYREL) 100 MG tablet Take 100 mg by mouth at bedtime.    venlafaxine XR (EFFEXOR-XR) 150 MG 24 hr capsule Take 150 mg by mouth daily.    gabapentin (NEURONTIN) 100 MG capsule Take 100 mg by mouth 2 (two) times daily.      STOP taking these medications     aspirin EC 81 MG tablet      diclofenac sodium (VOLTAREN) 1 %  GEL      magnesium oxide (MAG-OX) 400 MG tablet         Today   VITAL SIGNS:  Blood pressure (!) 126/48, pulse 84, temperature 97.8 F (36.6 C), temperature source Oral, resp. rate 18, height 5\' 7"  (1.702 m), weight 123.9 kg (273 lb 1.6 oz), SpO2 96 %.  I/O:   Intake/Output Summary (Last 24 hours) at 05/12/17 1359 Last data filed at 05/12/17 1356  Gross per 24 hour  Intake              720 ml  Output             1100 ml  Net             -380 ml    PHYSICAL EXAMINATION:  Physical Exam  GENERAL:  66 y.o.-year-old patient lying in the bed with no acute distress. Morbidly obese LUNGS: Normal breath sounds bilaterally, no wheezing, rales,rhonchi or crepitation. No use of accessory muscles of respiration.  CARDIOVASCULAR: S1, S2 normal. No murmurs, rubs, or gallops.  ABDOMEN: Soft, non-tender, non-distended. Bowel sounds present. No organomegaly or mass.  NEUROLOGIC: Moves all 4 extremities. PSYCHIATRIC: The patient is alert and oriented x 3.   DATA REVIEW:   CBC  Recent Labs Lab 05/11/17 0420  WBC 11.6*  HGB 8.0*  HCT 24.2*  PLT 361    Chemistries   Recent Labs Lab 05/11/17 0420  NA 140  K 3.1*  CL 106  CO2 27  GLUCOSE 123*  BUN 6  CREATININE 0.94  CALCIUM 7.6*    Cardiac Enzymes No results for  input(s): TROPONINI in the last 168 hours.  Microbiology Results  Results for orders placed or performed during the hospital encounter of 05/03/17  Gastrointestinal Panel by PCR , Stool     Status: None   Collection Time: 05/03/17  4:22 PM  Result Value Ref Range Status   Campylobacter species NOT DETECTED NOT DETECTED Final   Plesimonas shigelloides NOT DETECTED NOT DETECTED Final   Salmonella species NOT DETECTED NOT DETECTED Final   Yersinia enterocolitica NOT DETECTED NOT DETECTED Final   Vibrio species NOT DETECTED NOT DETECTED Final   Vibrio cholerae NOT DETECTED NOT DETECTED Final   Enteroaggregative E coli (EAEC) NOT DETECTED NOT DETECTED Final   Enteropathogenic E coli (EPEC) NOT DETECTED NOT DETECTED Final   Enterotoxigenic E coli (ETEC) NOT DETECTED NOT DETECTED Final   Shiga like toxin producing E coli (STEC) NOT DETECTED NOT DETECTED Final   Shigella/Enteroinvasive E coli (EIEC) NOT DETECTED NOT DETECTED Final   Cryptosporidium NOT DETECTED NOT DETECTED Final   Cyclospora cayetanensis NOT DETECTED NOT DETECTED Final   Entamoeba histolytica NOT DETECTED NOT DETECTED Final   Giardia lamblia NOT DETECTED NOT DETECTED Final   Adenovirus F40/41 NOT DETECTED NOT DETECTED Final   Astrovirus NOT DETECTED NOT DETECTED Final   Norovirus GI/GII NOT DETECTED NOT DETECTED Final   Rotavirus A NOT DETECTED NOT DETECTED Final   Sapovirus (I, II, IV, and V) NOT DETECTED NOT DETECTED Final  C difficile quick scan w PCR reflex     Status: None   Collection Time: 05/03/17  4:22 PM  Result Value Ref Range Status   C Diff antigen NEGATIVE NEGATIVE Final   C Diff toxin NEGATIVE NEGATIVE Final   C Diff interpretation No C. difficile detected.  Final  MRSA PCR Screening     Status: None   Collection Time: 05/06/17  8:42 PM  Result Value Ref Range  Status   MRSA by PCR NEGATIVE NEGATIVE Final    Comment:        The GeneXpert MRSA Assay (FDA approved for NASAL specimens only), is one  component of a comprehensive MRSA colonization surveillance program. It is not intended to diagnose MRSA infection nor to guide or monitor treatment for MRSA infections.     RADIOLOGY:  No results found.  Follow up with PCP in 1 week.  Management plans discussed with the patient, family and they are in agreement.  CODE STATUS:     Code Status Orders        Start     Ordered   05/03/17 0526  Full code  Continuous     05/03/17 0525    Code Status History    Date Active Date Inactive Code Status Order ID Comments User Context   04/28/2017  3:02 PM 04/30/2017  7:04 PM Full Code [id number]  Altamese DillingVachhani, Vaibhavkumar, MD Inpatient    Advance Directive Documentation     Most Recent Value  Type of Advance Directive  Healthcare Power of Attorney  Pre-existing out of facility DNR order (yellow form or pink MOST form)  -  "MOST" Form in Place?  -      TOTAL TIME TAKING CARE OF THIS PATIENT ON DAY OF DISCHARGE: more than 30 minutes.   Milagros LollSudini, Dafna Romo R M.D on 05/12/2017 at 1:59 PM  Between 7am to 6pm - Pager - [phone]  After 6pm go to [email or web address] - password [email or web address] [facility]  [facility] [place] [facility]  Office  [phone]  CC: Primary care physician; Clovis PuVan Horn, Arturo MortonJill E, DO  Note: This dictation was prepared with Dragon dictation along with smaller phrase technology. Any transcriptional errors that result from this process are unintentional.

## 2017-05-12 NOTE — Care Management (Signed)
[facility]. PT has assessed patient and recommended home health PT.  Discussed patient disposition with daughter. Provided daughter with choice of home health agency.  [email or web address] RNCM signing off.

## 2017-05-15 ENCOUNTER — Ambulatory Visit: Payer: Medicare PPO | Admitting: Gastroenterology

## 2017-05-15 ENCOUNTER — Encounter: Payer: Self-pay | Admitting: Gastroenterology

## 2017-05-21 ENCOUNTER — Ambulatory Visit (INDEPENDENT_AMBULATORY_CARE_PROVIDER_SITE_OTHER): Payer: Medicare PPO | Admitting: Gastroenterology

## 2017-05-21 ENCOUNTER — Telehealth: Payer: Self-pay | Admitting: Gastroenterology

## 2017-05-21 ENCOUNTER — Encounter: Payer: Self-pay | Admitting: Gastroenterology

## 2017-05-21 VITALS — BP 127/79 | HR 94 | Temp 94.7°F | Wt 256.4 lb

## 2017-05-21 DIAGNOSIS — K922 Gastrointestinal hemorrhage, unspecified: Secondary | ICD-10-CM | POA: Diagnosis not present

## 2017-05-21 NOTE — Progress Notes (Signed)
Wyline Mood MD, MRCP(U.K) [address]  [address]  [place], [place] [address]  Main: [phone]  Fax: [phone]   Primary Care Physician: Clovis Pu, Arturo Morton, DO  Primary Gastroenterologist:  Dr. Wyline Mood   No chief complaint on file.   HPI: Jacqueline Williams is a 66 y.o. female She is here today for a hospital follow up after being discharged on 05/12/17   Summary of history : Jacqueline Williams is a 66 y.o. female was admitted initially on 04/28/17-04/30/17 and I was consulted to see her and at that time I felt she had a diverticular bleed and it had stopped at the time of her colonoscopy on 04/30/18 showed non bleeding diverticuli. She was discharged and again admitted on 05/03/17 admitted and  was seen by GI Dr Orvan Falconer for rectal bleeding who then performed an EGD on 05/03/17  Which did not reveal a source . While awaiting discharge noted further drop in HB with no overt blood loss. 1 episode of black stool small in qty , has had multiple units of PRBC. Capsule study done on 05/07/17 .The capsule stayed in her stomach for 4 hours and 17 mins and moved to her jejunum at that point. At the 4 hour 30 mins mark I could see some blood which I would assume since it was bright red is from a lesion bleeding in the vicinity .Since the capsule spentan abnormal amount of time in the stomach , we ran out of recording time at the end of the study at 7 hours and 30 mins and the capsule was only probably in the mid small bowel and hence incomplete.I performed a push enteroscopy and no bleeding seen on 05/09/17, repeat capsule placed endoscopically in the jejunum, again unfortunately the study was incomplete likely reached distal small bowel. No active bleeding seen , Possible one very tiny non bleeding AVM at the 1 hour 31 min mark.She was discharged at that point    Interval history   05/12/2017-  05/21/2017   Brown stool since discharge- had a repeat CBC and was not informed of any abnormal values. Feeling  well. Sometimes feels a bit weak  . Here today with the son in law. The daughter makes decisions for the patient but is not here today    Current Outpatient Prescriptions  Medication Sig Dispense Refill  . acetaminophen (TYLENOL) 500 MG tablet Take 500 mg by mouth every 6 (six) hours as needed.    . calcium-vitamin D (OSCAL WITH D) 500-200 MG-UNIT tablet Take 1 tablet by mouth.    . cetirizine (ZYRTEC) 10 MG tablet Take 10 mg by mouth daily.    Marland Kitchen CREON 24000-76000 units CPEP     . cyanocobalamin (,VITAMIN B-12,) 1000 MCG/ML injection Inject 1 mL into the skin every 30 (thirty) days.    Marland Kitchen DEPO-ESTRADIOL 5 MG/ML injection     . ferrous gluconate (FERGON) 324 MG tablet Take 1 tablet (324 mg total) by mouth 3 (three) times daily with meals. 90 tablet 3  . fluticasone (FLONASE) 50 MCG/ACT nasal spray Place 1 spray into both nostrils daily as needed for allergies or rhinitis.    . furosemide (LASIX) 20 MG tablet     . gabapentin (NEURONTIN) 100 MG capsule Take 100 mg by mouth 2 (two) times daily.    Marland Kitchen lactase (LACTAID) 3000 units tablet Take by mouth 3 (three) times daily with meals.    Marland Kitchen levothyroxine (SYNTHROID, LEVOTHROID) 25 MCG tablet Take 25 mcg  by mouth daily.    Marland Kitchen loperamide (IMODIUM) 2 MG capsule Take by mouth as needed for diarrhea or loose stools.    Marland Kitchen LORazepam (ATIVAN) 1 MG tablet Take 0.5-1 mg by mouth 4 (four) times daily. 0.5 mg 0630,  1 mg 1100, 0.5 mg 1600, 1 mg 2030    . naproxen (NAPROSYN) 500 MG tablet     . oxybutynin (DITROPAN) 5 MG tablet Take 5 mg by mouth 2 (two) times daily.    Marland Kitchen oxyCODONE-acetaminophen (PERCOCET) 5-325 MG tablet Take 1 tablet by mouth 4 (four) times daily. 0630,1100,1600,2030    . pantoprazole (PROTONIX) 40 MG tablet Take 40 mg by mouth daily.    . potassium chloride (MICRO-K) 10 MEQ CR capsule     . promethazine (PHENERGAN) 25 MG tablet Take 25 mg by mouth every 6 (six) hours as needed.    . risperidone (RISPERDAL) 4 MG tablet Take 4 mg by mouth  daily.    . simethicone (MYLICON) 80 MG chewable tablet Chew 80 mg by mouth every 6 (six) hours as needed for flatulence.    . traZODone (DESYREL) 100 MG tablet Take 100 mg by mouth at bedtime.    Marland Kitchen venlafaxine XR (EFFEXOR-XR) 150 MG 24 hr capsule Take 150 mg by mouth daily.     No current facility-administered medications for this visit.     Allergies as of 05/21/2017 - Review Complete 05/09/2017  Allergen Reaction Noted  . Other Shortness Of Breath 04/28/2017  . Penicillins  04/28/2017  . Seroquel [quetiapine fumarate] Other (See Comments) 04/28/2017    ROS:  General: Negative for anorexia, weight loss, fever, chills, fatigue, weakness. ENT: Negative for hoarseness, difficulty swallowing , nasal congestion. CV: Negative for chest pain, angina, palpitations, dyspnea on exertion, peripheral edema.  Respiratory: Negative for dyspnea at rest, dyspnea on exertion, cough, sputum, wheezing.  GI: See history of present illness. GU:  Negative for dysuria, hematuria, urinary incontinence, urinary frequency, nocturnal urination.  Endo: Negative for unusual weight change.    Physical Examination:   There were no vitals taken for this visit.  General: Well-nourished, well-developed in no acute distress.  Eyes: No icterus. Conjunctivae pink. Mouth: Oropharyngeal mucosa moist and pink , no lesions erythema or exudate. Lungs: Clear to auscultation bilaterally. Non-labored. Heart: Regular rate and rhythm, no murmurs rubs or gallops.  Abdomen: Bowel sounds are normal, nontender, nondistended, no hepatosplenomegaly or masses, no abdominal bruits or hernia , no rebound or guarding.   Extremities: No lower extremity edema. No clubbing or deformities. Neuro: Alert and oriented x 3.  Grossly intact. Skin: Warm and dry, no jaundice.   Psych: Alert and cooperative, normal mood and affect.   Imaging Studies: Dg Chest 1 View  Result Date: 05/03/2017 CLINICAL DATA:  66 y/o  F; chest pain and back  pain. EXAM: CHEST 1 VIEW COMPARISON:  None. FINDINGS: The heart size and mediastinal contours are within normal limits. Both lungs are clear. The visualized skeletal structures are unremarkable. IMPRESSION: No active disease. Electronically Signed   By: Mitzi Hansen M.D.   On: 05/03/2017 02:56   Dg Abd 1 View  Result Date: 05/08/2017 CLINICAL DATA:  Capsule study.  Evaluate for passage. EXAM: ABDOMEN - 1 VIEW COMPARISON:  CT 04/28/2017. FINDINGS: No radiopaque capsule noted . Surgical clips and sutures upper abdomen and pelvis. Nonspecific air-filled loops small bowel noted. Colon is nondistended. No free air. Prior lumbosacral spine fusion. Degenerative changes lumbar spine and both hips. Diffuse osteopenia. Basal pleural-parenchymal thickening noted  most consistent with scarring. IMPRESSION: No radiopaque capsule noted. Electronically Signed   By: Maisie Fushomas  Register   On: 05/08/2017 11:17   Nm Gi Blood Loss  Result Date: 05/06/2017 CLINICAL DATA:  Read bloody stool episodes for 1 week, colonoscopy 04/30/2017, upper endoscopy 05/03/2017, acute post hemorrhagic anemia, patient receiving blood during scan. EXAM: NUCLEAR MEDICINE GASTROINTESTINAL BLEEDING SCAN TECHNIQUE: Sequential abdominal images were obtained following intravenous administration of Tc-9152m labeled red blood cells. RADIOPHARMACEUTICALS:  22.8 mCi Tc-3452m in-vitro labeled red cells. COMPARISON:  None. FINDINGS: No abnormal radiotracer identified that would confirm active GI bleed. IMPRESSION: No active GI bleeding site identified. Electronically Signed   By: Bary RichardStan  Maynard M.D.   On: 05/06/2017 14:31   Ct Abdomen Pelvis W Contrast  Result Date: 04/28/2017 CLINICAL DATA:  Left-sided abdominal pain, bright red stools. Possible GI bleed. EXAM: CT ABDOMEN AND PELVIS WITH CONTRAST TECHNIQUE: Multidetector CT imaging of the abdomen and pelvis was performed using the standard protocol following bolus administration of intravenous contrast.  CONTRAST:  75mL ISOVUE-300 IOPAMIDOL (ISOVUE-300) INJECTION 61% COMPARISON:  04/29/2013. FINDINGS: Lower chest: Lung bases show no acute findings. Heart size normal. No pericardial or pleural effusion. Fluid-filled distal esophagus with a small hiatal hernia. Hepatobiliary: Liver is unremarkable. Cholecystectomy. No biliary ductal dilatation. Pancreas: Negative. Spleen: Negative. Adrenals/Urinary Tract: Adrenal glands and kidneys are unremarkable. Ureters are decompressed. Bladder is grossly unremarkable. Stomach/Bowel: Postoperative changes of gastric bypass. There is dilatation of the distal duodenum and proximal jejunum to the level of an anastomotic suture line within the left paramidline low abdomen (series 2, image 47). Findings may be similar to 04/29/2013. Small bowel and colon are otherwise unremarkable. Appendix not readily visualized. Vascular/Lymphatic: Mild atherosclerotic calcification of the arterial vasculature. Retroaortic left renal vein. No pathologically enlarged lymph nodes. Reproductive: Hysterectomy.  No adnexal mass. Other: No free fluid.  Mesenteries and peritoneum are unremarkable. Musculoskeletal: No worrisome lytic or sclerotic lesions. L5-S1 PLIF. IMPRESSION: 1. Dilatation of distal duodenal and proximal jejunal loops, to the level of a small bowel anastomosis in the left paramidline lower abdomen, somewhat similar to the prior exam, suggesting atony. Partial obstruction is not excluded. 2.  Aortic atherosclerosis (ICD10-170.0). Electronically Signed   By: Leanna BattlesMelinda  Blietz M.D.   On: 04/28/2017 12:20    Assessment and Plan:   Milus MallickKay W Wegener is a 66 y.o. y/o female here today for a hospital follow up for overt obscure GI bleeding . Recent capsule study was incomplete due to possible slow transit .It is still possible she had a diverticular bleed that re bled on a few occasions recently although we have no objective definite proof of the same which is very often the nature of this  condition  Plan  1. I discussed her options which are to  watch and see how she does vs attempt 12 hour capsule study [facility] - she chose atthis time to just watch and wait - will repeat CBC in a week and get recent CBC results. Explained to son in law to inform the daughter and if there is a wish to change the plan to contact me .   2. If she has active bleed in the meanwhile and comes into the ER to get stat tagged RBC scan in the ER to catch the source of bleed when active  Dr Wyline MoodKiran Demaya Hardge  MD,MRCP [facility](U.K) Follow up in 3 weeks

## 2017-05-21 NOTE — Telephone Encounter (Signed)
Patient left a voice message wanting to know when they will do her lab work? Please call.  Advised patient to have labs drawn on 6/6 per Dr. Tobi BastosAnna.

## 2017-05-21 NOTE — Telephone Encounter (Signed)
Patient left a voice message wanting to know when they will do her lab work? Please call

## 2017-06-02 ENCOUNTER — Telehealth: Payer: Self-pay

## 2017-06-02 NOTE — Telephone Encounter (Signed)
LVM for patient callback for results per Dr. Tobi BastosAnna.   Inform hb 11.7 which has improved almost normal suggest repeat CBC in 8-10 weeks to ensure it is stable

## 2017-06-02 NOTE — Telephone Encounter (Signed)
-----   Message from Wyline MoodKiran Anna, MD sent at 06/01/2017  5:03 PM EDT ----- Inform hb 11.7 which has improved almost normal suggest repeat CBC in 8-10 weeks to ensure it is stable

## 2017-06-11 ENCOUNTER — Encounter: Payer: Self-pay | Admitting: *Deleted

## 2017-06-11 ENCOUNTER — Encounter: Payer: Self-pay | Admitting: Gastroenterology

## 2017-06-11 ENCOUNTER — Ambulatory Visit (INDEPENDENT_AMBULATORY_CARE_PROVIDER_SITE_OTHER): Payer: Medicare PPO | Admitting: Gastroenterology

## 2017-06-11 VITALS — BP 121/76 | Ht 67.0 in | Wt 247.4 lb

## 2017-06-11 DIAGNOSIS — K922 Gastrointestinal hemorrhage, unspecified: Secondary | ICD-10-CM | POA: Diagnosis not present

## 2017-06-11 NOTE — Progress Notes (Signed)
Wyline MoodKiran Caleigha Zale MD, MRCP(U.K) [address]  [address]  [place], [place] [address]  Main: [phone]  Fax: [phone]   Primary Care Physician: Clovis PuVan Horn, Arturo MortonJill E, DO  Primary Gastroenterologist:  Dr. Wyline MoodKiran Nikash Mortensen   No chief complaint on file.   HPI: Jacqueline Williams is a 66 y.o. female   Summary of history : Jacqueline Williams is a 66 y.o. female was admitted initially on 04/28/17-04/30/17 and I was consulted to see her and at that time I felt she had a diverticular bleed and it had stopped at the time of her colonoscopy on 04/30/18 showed non bleeding diverticuli. She was discharged and again admitted on 05/03/17 admitted and  was seen by GI Dr Orvan FalconerBeavers for rectal bleeding who then performed an EGD on 05/03/17  Which did not reveal a source . While awaiting discharge noted further drop in HB with no overt blood loss. 1 episode of black stool small in qty , has had multiple units of PRBC. Capsule study done on 05/07/17 .The capsule stayed in her stomach for 4 hours and 17 mins and moved to her jejunum at that point. At the 4 hour 30 mins mark I could see some blood which I would assume since it was bright red is from a lesion bleeding in the vicinity .Since the capsule spentan abnormal amount of time in the stomach , we ran out of recording time at the end of the study at 7 hours and 30 mins and the capsule was only probably in the mid small bowel and hence incomplete.I performed a push enteroscopy and no bleeding seen on 05/09/17, repeat capsule placed endoscopically in the jejunum, again unfortunately the study was incomplete likely reached distal small bowel. 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   Current Outpatient Prescriptions  Medication Sig Dispense Refill  .  acetaminophen (TYLENOL) 500 MG tablet Take 500 mg by mouth every 6 (six) hours as needed.    . calcium-vitamin D (OSCAL WITH D) 500-200 MG-UNIT tablet Take 1 tablet by mouth.    . cetirizine (ZYRTEC) 10 MG tablet Take 10 mg by mouth daily.    Marland Kitchen. CREON 24000-76000 units CPEP     . cyanocobalamin (,VITAMIN B-12,) 1000 MCG/ML injection Inject 1 mL into the skin every 30 (thirty) days.    Marland Kitchen. DEPO-ESTRADIOL 5 MG/ML injection     . ferrous gluconate (FERGON) 324 MG tablet Take 1 tablet (324 mg total) by mouth 3 (three) times daily with meals. 90 tablet 3  . fluticasone (FLONASE) 50 MCG/ACT nasal spray Place 1 spray into both nostrils daily as needed for allergies or rhinitis.    . furosemide (LASIX) 20 MG tablet     . gabapentin (NEURONTIN) 100 MG capsule Take 100 mg by mouth 2 (two) times daily.    Marland Kitchen. lactase (LACTAID) 3000 units tablet Take by mouth 3 (three) times daily with meals.    Marland Kitchen. levothyroxine (SYNTHROID, LEVOTHROID) 25 MCG tablet Take 25 mcg by mouth daily.    Marland Kitchen. loperamide (IMODIUM) 2 MG capsule Take by mouth as needed for diarrhea or loose stools.    Marland Kitchen. LORazepam (ATIVAN) 1 MG tablet Take 0.5-1 mg by mouth 4 (four)  times daily. 0.5 mg 0630,  1 mg 1100, 0.5 mg 1600, 1 mg 2030    . naproxen (NAPROSYN) 500 MG tablet     . oxybutynin (DITROPAN) 5 MG tablet Take 5 mg by mouth 2 (two) times daily.    Marland Kitchen oxyCODONE-acetaminophen (PERCOCET) 5-325 MG tablet Take 1 tablet by mouth 4 (four) times daily. 0630,1100,1600,2030    . pantoprazole (PROTONIX) 40 MG tablet Take 40 mg by mouth daily.    . potassium chloride (MICRO-K) 10 MEQ CR capsule     . promethazine (PHENERGAN) 25 MG tablet Take 25 mg by mouth every 6 (six) hours as needed.    . risperidone (RISPERDAL) 4 MG tablet Take 4 mg by mouth daily.    . simethicone (MYLICON) 80 MG chewable tablet Chew 80 mg by mouth every 6 (six) hours as needed for flatulence.    . traZODone (DESYREL) 100 MG tablet Take 100 mg by mouth at bedtime.    Marland Kitchen venlafaxine  XR (EFFEXOR-XR) 150 MG 24 hr capsule Take 150 mg by mouth daily.     No current facility-administered medications for this visit.     Allergies as of 06/11/2017 - Review Complete 05/09/2017  Allergen Reaction Noted  . Other Shortness Of Breath 04/28/2017  . Penicillins  04/28/2017  . Seroquel [quetiapine fumarate] Other (See Comments) 04/28/2017    ROS:  General: Negative for anorexia, weight loss, fever, chills, fatigue, weakness. ENT: Negative for hoarseness, difficulty swallowing , nasal congestion. CV: Negative for chest pain, angina, palpitations, dyspnea on exertion, peripheral edema.  Respiratory: Negative for dyspnea at rest, dyspnea on exertion, cough, sputum, wheezing.  GI: See history of present illness. GU:  Negative for dysuria, hematuria, urinary incontinence, urinary frequency, nocturnal urination.  Endo: Negative for unusual weight change.    Physical Examination:   There were no vitals taken for this visit.  General: Well-nourished, well-developed in no acute distress.  Eyes: No icterus. Conjunctivae pink. Mouth: Oropharyngeal mucosa moist and pink , no lesions erythema or exudate. Lungs: Clear to auscultation bilaterally. Non-labored. Heart: Regular rate and rhythm, no murmurs rubs or gallops.  Abdomen: Bowel sounds are normal, nontender, nondistended, no hepatosplenomegaly or masses, no abdominal bruits or hernia , no rebound or guarding.   Extremities: No lower extremity edema. No clubbing or deformities.Walks with a walker  Neuro: Alert and oriented x 3.  Grossly intact. Skin: Warm and dry, no jaundice.   Psych: Alert and cooperative, normal mood and affect.   Imaging Studies: No results found.  Assessment and Plan:   Jacqueline Williams is a 66 y.o. y/o female here to follow up for overt obscure GI bleeding . Recent capsule study was incomplete due to possible slow transit .It is still possible she had a diverticular bleed that re bled on a few occasions  recently although we have no objective definite proof of the same which is very often the nature of this condition. She has not shown any signs of overt bleeding  Plan  1. I discussed her options which are to  watch and see how she does vs attempt 12 hour capsule study vs enteroscopy at [facility] - she chose atthis time to just watch and wait - will repeat CBC in 8 weeks. She is aware if her stool ever turns black to call my office right away   2. If she has active bleed in the meanwhile and comes into the ER to get stat tagged RBC scan in the ER to catch the  source of bleed when active  Dr Wyline Mood  MD,MRCP [facility]) Follow up PRN

## 2017-07-11 ENCOUNTER — Telehealth: Payer: Self-pay

## 2017-07-11 NOTE — Telephone Encounter (Signed)
Attempted to contact patient to schedule repeat [facility].   Unable to connect call due to circuits being busy.   Will re-attempt.

## 2017-07-15 ENCOUNTER — Telehealth: Payer: Self-pay

## 2017-07-15 NOTE — Telephone Encounter (Signed)
LVM reminder concerning [facility] to [facility] at [facility] or [facility].

## 2017-08-12 ENCOUNTER — Telehealth: Payer: Self-pay

## 2017-08-12 NOTE — Telephone Encounter (Signed)
LVM for patient to have repeat CBC labs drawn per Dr. Tobi Bastos.

## 2017-08-12 NOTE — Telephone Encounter (Signed)
-----   Message from [place], [place] sent at 06/11/2017  1:35 PM EDT ----- Regarding: Repeat CBC Repeat CBC in 2 months

## 2017-09-19 ENCOUNTER — Other Ambulatory Visit: Payer: Self-pay | Admitting: Neurology

## 2017-09-19 DIAGNOSIS — R413 Other amnesia: Secondary | ICD-10-CM

## 2017-09-26 ENCOUNTER — Ambulatory Visit
Admission: RE | Admit: 2017-09-26 | Discharge: 2017-09-26 | Disposition: A | Discharge status: Home / Self Care | Payer: Medicare PPO | Source: Ambulatory Visit | Attending: Neurology | Admitting: Neurology

## 2017-09-26 DIAGNOSIS — R413 Other amnesia: Secondary | ICD-10-CM | POA: Diagnosis not present

## 2018-03-10 ENCOUNTER — Other Ambulatory Visit: Payer: Self-pay | Admitting: Emergency Medicine

## 2018-03-10 DIAGNOSIS — Z1231 Encounter for screening mammogram for malignant neoplasm of breast: Secondary | ICD-10-CM

## 2018-03-25 ENCOUNTER — Ambulatory Visit
Admission: RE | Admit: 2018-03-25 | Discharge: 2018-03-25 | Disposition: A | Discharge status: Home / Self Care | Payer: Medicare PPO | Source: Ambulatory Visit | Attending: Emergency Medicine | Admitting: Emergency Medicine

## 2018-03-25 DIAGNOSIS — Z1231 Encounter for screening mammogram for malignant neoplasm of breast: Secondary | ICD-10-CM | POA: Diagnosis not present

## 2018-05-21 ENCOUNTER — Other Ambulatory Visit: Payer: Self-pay | Admitting: Physician Assistant

## 2019-12-24 DIAGNOSIS — M869 Osteomyelitis, unspecified: Secondary | ICD-10-CM

## 2019-12-24 HISTORY — DX: Osteomyelitis, unspecified: M86.9

## 2020-02-29 ENCOUNTER — Other Ambulatory Visit: Payer: Self-pay | Admitting: Family Medicine

## 2020-02-29 ENCOUNTER — Other Ambulatory Visit: Payer: Self-pay | Admitting: Geriatric Medicine

## 2020-02-29 ENCOUNTER — Other Ambulatory Visit: Payer: Self-pay | Admitting: *Deleted

## 2020-02-29 DIAGNOSIS — M86172 Other acute osteomyelitis, left ankle and foot: Secondary | ICD-10-CM

## 2020-03-11 ENCOUNTER — Other Ambulatory Visit: Payer: Self-pay

## 2020-03-11 ENCOUNTER — Ambulatory Visit
Admission: RE | Admit: 2020-03-11 | Discharge: 2020-03-11 | Disposition: A | Discharge status: Home / Self Care | Payer: Medicare PPO | Source: Ambulatory Visit | Attending: Family Medicine | Admitting: Family Medicine

## 2020-03-11 DIAGNOSIS — M86172 Other acute osteomyelitis, left ankle and foot: Secondary | ICD-10-CM | POA: Diagnosis not present

## 2020-03-11 LAB — POCT I-STAT CREATININE: Creatinine, Ser: 1.1 mg/dL — ABNORMAL HIGH (ref 0.44–1.00)

## 2020-03-11 MED ORDER — GADOBUTROL 1 MMOL/ML IV SOLN
10.0000 mL | Freq: Once | INTRAVENOUS | Status: AC | PRN
  Administered 2020-03-11: 10 mL via INTRAVENOUS

## 2020-05-11 ENCOUNTER — Encounter: Payer: Self-pay | Admitting: Infectious Diseases

## 2020-05-11 ENCOUNTER — Other Ambulatory Visit: Payer: Self-pay

## 2020-05-11 ENCOUNTER — Ambulatory Visit: Payer: Medicare PPO | Attending: Infectious Diseases | Admitting: Infectious Diseases

## 2020-05-11 VITALS — BP 92/63 | HR 94 | Temp 98.7°F | Resp 16 | Ht 67.0 in | Wt 205.0 lb

## 2020-05-11 DIAGNOSIS — M86672 Other chronic osteomyelitis, left ankle and foot: Secondary | ICD-10-CM | POA: Insufficient documentation

## 2020-05-11 DIAGNOSIS — G629 Polyneuropathy, unspecified: Secondary | ICD-10-CM | POA: Insufficient documentation

## 2020-05-11 DIAGNOSIS — Z886 Allergy status to analgesic agent status: Secondary | ICD-10-CM | POA: Insufficient documentation

## 2020-05-11 DIAGNOSIS — Z9884 Bariatric surgery status: Secondary | ICD-10-CM | POA: Insufficient documentation

## 2020-05-11 DIAGNOSIS — F319 Bipolar disorder, unspecified: Secondary | ICD-10-CM | POA: Diagnosis not present

## 2020-05-11 DIAGNOSIS — Z79899 Other long term (current) drug therapy: Secondary | ICD-10-CM | POA: Diagnosis not present

## 2020-05-11 DIAGNOSIS — Z888 Allergy status to other drugs, medicaments and biological substances status: Secondary | ICD-10-CM | POA: Insufficient documentation

## 2020-05-11 DIAGNOSIS — I10 Essential (primary) hypertension: Secondary | ICD-10-CM | POA: Diagnosis not present

## 2020-05-11 DIAGNOSIS — Z79890 Hormone replacement therapy (postmenopausal): Secondary | ICD-10-CM | POA: Insufficient documentation

## 2020-05-11 DIAGNOSIS — Z880 Allergy status to penicillin: Secondary | ICD-10-CM | POA: Diagnosis not present

## 2020-05-11 HISTORY — DX: Other chronic osteomyelitis, left ankle and foot: M86.672

## 2020-05-11 NOTE — Progress Notes (Signed)
NAME: Jacqueline Williams  DOB: 04-30-1951  MRN: [id number]  Date/Time: 05/11/2020 11:31 AM   Subjective:  Patient is referred to me by her PCP Larey Seat for left toe wound and osteomyelitis and the need for intravenous antibiotics.   ?Medical records from her PCP office reviewed. Spoke to her PCP Elysse Polidore is a 69 y.o. female with history of poor gait, neuropathy because of lumbar stenosis and claudication leading to surgery  Presents for assessment of her left toe osteomyelitis. As per her PCP and the patient she initially had some callus on the left toe and was noted to have some redness and hence antibiotics were given this was likely in January February of this year.  The PCP also noted ingrowing toenail.  As it was not improving she was referred to podiatrist who debrided the callus and as far as I understand there was a wound after that which progressed.  An MRI of the foot was done on 03/11/2020 and it showed abnormal edema and enhancement of the distal phalanx of the great toe.   So she had a biopsy of the bone on 04/04/2020 which showed chronic osteomyelitis.  But no bone was sent for microbiology. Patient has received multiple courses of doxycycline and as per patient she has not had much improvement. No fever or chills. Patient has some pain on the left great toe when she walks  Past medical history Bipolar disorder Obesity with gastric bypass Polyneuropathy Spinal stenosis of lumbar region leading to surgery. Essential hypertension    Past Surgical History:  Procedure Laterality Date  . ABDOMINAL HYSTERECTOMY    . CHOLECYSTECTOMY    . COLONOSCOPY WITH PROPOFOL N/A 04/30/2017   Procedure: COLONOSCOPY WITH PROPOFOL;  Surgeon: Jonathon Bellows, MD;  Location: [facility] ENDOSCOPY;  Service: Endoscopy;  Laterality: N/A;  . ENTEROSCOPY N/A 05/09/2017   Procedure: ENTEROSCOPY with deplopyement of capsule for small bowel;  Surgeon: Jonathon Bellows, MD;  Location: [facility] ENDOSCOPY;   Service: Endoscopy;  Laterality: N/A;  . ESOPHAGOGASTRODUODENOSCOPY (EGD) WITH PROPOFOL N/A 05/03/2017   Procedure: ESOPHAGOGASTRODUODENOSCOPY (EGD) WITH PROPOFOL;  Surgeon: Jonathon Bellows, MD;  Location: [facility] ENDOSCOPY;  Service: Gastroenterology;  Laterality: N/A;  . gastric bipass    . GIVENS CAPSULE STUDY N/A 05/07/2017   Procedure: GIVENS CAPSULE STUDY;  Surgeon: Jonathon Bellows, MD;  Location: [facility] ENDOSCOPY;  Service: Endoscopy;  Laterality: N/A;  . HERNIA REPAIR      Social History   Socioeconomic History  . Marital status: Widowed    Spouse name: Not on file  . Number of children: Not on file  . Years of education: Not on file  . Highest education level: Not on file  Occupational History  . Occupation: home maker  Tobacco Use  . Smoking status: Never Smoker  . Smokeless tobacco: Never Used  Substance and Sexual Activity  . Alcohol use: No  . Drug use: No  . Sexual activity: Not on file  Other Topics Concern  . Not on file  Social History Narrative  . Not on file   Social Determinants of Health   Financial Resource Strain:   . Difficulty of Paying Living Expenses:   Food Insecurity:   . Worried About Charity fundraiser in the Last Year:   . Arboriculturist in the Last Year:   Transportation Needs:   . Film/video editor (Medical):   Marland Kitchen Lack of Transportation (Non-Medical):   Physical Activity:   . Days of Exercise per  Week:   . Minutes of Exercise per Session:   Stress:   . Feeling of Stress :   Social Connections:   . Frequency of Communication with Friends and Family:   . Frequency of Social Gatherings with Friends and Family:   . Attends Religious Services:   . Active Member of Clubs or Organizations:   . Attends Archivist Meetings:   Marland Kitchen Marital Status:   Intimate Partner Violence:   . Fear of Current or Ex-Partner:   . Emotionally Abused:   Marland Kitchen Physically Abused:   . Sexually Abused:     Family History  Problem Relation Age of Onset  . Lung  cancer Father   . Hypertension Neg Hx   . Diabetes Neg Hx    Allergies  Allergen Reactions  . Other Shortness Of Breath    caffergot  . Penicillin G Sodium Anaphylaxis  . Aspirin Other (See Comments)  . Ergotamine Tartrate Other (See Comments)  . Ibuprofen Other (See Comments)  . Ketorolac Tromethamine Nausea Only  . Naproxen Nausea Only  . Olanzapine Other (See Comments)  . Oxaprozin Other (See Comments)  . Penicillins   . Seroquel [Quetiapine Fumarate] Other (See Comments)    hallucinations   ? Current Outpatient Medications  Medication Sig Dispense Refill  . acetaminophen (TYLENOL) 500 MG tablet Take 500 mg by mouth every 6 (six) hours as needed.    . calcium-vitamin D (OSCAL WITH D) 500-200 MG-UNIT tablet Take 1 tablet by mouth.    . cetirizine (ZYRTEC) 10 MG tablet Take 10 mg by mouth daily.    Marland Kitchen CREON 24000-76000 units CPEP     . cyanocobalamin (,VITAMIN B-12,) 1000 MCG/ML injection Inject 1 mL into the skin every 30 (thirty) days.    Marland Kitchen DEPO-ESTRADIOL 5 MG/ML injection     . ferrous gluconate (FERGON) 324 MG tablet Take 1 tablet (324 mg total) by mouth 3 (three) times daily with meals. 90 tablet 3  . fluticasone (FLONASE) 50 MCG/ACT nasal spray Place 1 spray into both nostrils daily as needed for allergies or rhinitis.    . furosemide (LASIX) 20 MG tablet     . gabapentin (NEURONTIN) 100 MG capsule Take 100 mg by mouth 2 (two) times daily.    Marland Kitchen lactase (LACTAID) 3000 units tablet Take by mouth 3 (three) times daily with meals.    Marland Kitchen levothyroxine (SYNTHROID, LEVOTHROID) 25 MCG tablet Take 25 mcg by mouth daily.    Marland Kitchen loperamide (IMODIUM) 2 MG capsule Take by mouth as needed for diarrhea or loose stools.    Marland Kitchen LORazepam (ATIVAN) 1 MG tablet Take 0.5-1 mg by mouth 4 (four) times daily. 0.5 mg 0630,  1 mg 1100, 0.5 mg 1600, 1 mg 2030    . Melatonin 3 MG CAPS Take by mouth.    . oxybutynin (DITROPAN) 5 MG tablet Take 5 mg by mouth 2 (two) times daily.    Marland Kitchen  oxyCODONE-acetaminophen (PERCOCET) 5-325 MG tablet Take 1 tablet by mouth 4 (four) times daily. 0630,1100,1600,2030    . pantoprazole (PROTONIX) 40 MG tablet Take 40 mg by mouth daily.    . potassium chloride (MICRO-K) 10 MEQ CR capsule     . promethazine (PHENERGAN) 25 MG tablet Take 25 mg by mouth every 6 (six) hours as needed.    . risperidone (RISPERDAL) 4 MG tablet Take 4 mg by mouth daily.    . simethicone (MYLICON) 80 MG chewable tablet Chew 80 mg by mouth every 6 (six) hours  as needed for flatulence.    . traZODone (DESYREL) 100 MG tablet Take 100 mg by mouth at bedtime.    Marland Kitchen venlafaxine XR (EFFEXOR-XR) 150 MG 24 hr capsule Take 150 mg by mouth daily.     No current facility-administered medications for this visit.     Abtx:  Anti-infectives (From admission, onward)   None      REVIEW OF SYSTEMS:  Const: negative fever, negative chills, negative weight loss Eyes: negative diplopia or visual changes, negative eye pain ENT: negative coryza, negative sore throat Resp: negative cough, hemoptysis, dyspnea Cards: negative for chest pain, palpitations, lower extremity edema GU: negative for frequency, dysuria and hematuria GI: Negative for abdominal pain, diarrhea, bleeding, constipation Skin: negative for rash and pruritus Heme: negative for easy bruising and gum/nose bleeding MS: Back pain, leg pain Neurolo:negative for headaches, dizziness, vertigo, has memory problems Psych: negative for feelings of anxiety, depression  Endocrine: negative for thyroid, diabetes Allergy/Immunology-multiple allergies as listed above   Objective:  VITALS:  BP 92/63   Pulse 94   Temp 98.7 F (37.1 C) (Oral)   Resp 16   Ht _0  (1.702 m)   Wt 205 lb (93 kg)   SpO2 94%   BMI 32.11 kg/m  PHYSICAL EXAM:  General: Alert, cooperative, no distress, appears stated age.  Head: Normocephalic, without obvious abnormality, atraumatic. Eyes: Conjunctivae clear, anicteric sclerae. Pupils are  equal ENT did not examine Neck: Supple, symmetrical, no adenopathy, thyroid: non tender no carotid bruit and no JVD. Back: Did not examine Lungs: Bilateral air entry clear heart: Regular rate and rhythm, no murmur, rub or gallop. Abdomen: Did not examine Extremities: Left great toe not swollen, there is an area of maceration on the plantar aspect with a small pin-sized opening.  There is no discharge there is no redness Thickening of the nails due to fungus      Right great toe on the metatarsal phalangeal joint has callus  Skin: No rashes or lesions. Or bruising Lymph: Cervical, supraclavicular normal. Neurologic: Grossly non-focal Pertinent Labs Lab Results CBC   IMAGING RESULTS: MRI reviewed  I have personally reviewed the films ? Impression/Recommendation ? Left great toe callus status post debridement leading to chronic wound with underlying osteomyelitis which is chronic as well. The toe is not overtly infected. There is callus and no obviously infected wound present other than a small opening There is onychomycosis of the toenails There is fungal infection of the webspace The bone biopsy has shown chronic osteomyelitis No bone culture was done Knee there is a wound culture present Patient has taken multiple courses of doxycycline with no improvement Spoke to the PCP Virgie Dad and asked her to send an ESR CRP Also to send a culture when there is a discharge after cleaning the wound properly and taking a deep culture. Currently no antibiotics are needed.  will follow as needed  Discussed the management with the patient and her daughter who came at the end of the visit in great detail and they are agreeing to the above plan ? ___________________________________________________ Discussed with patient, requesting provider Note:  This document was prepared using Dragon voice recognition software and may include unintentional dictation errors.

## 2020-05-11 NOTE — Patient Instructions (Addendum)
You have been referred to me for left great toe callus/iinfection. You had a callus which was debrided and then it was noted to have a some bone inflammation and bone biopsy done had chronic inflammation of the bone- no culture was done. Today I saw your toe and it does not look overtly infected. I spoke to your PCP Melonie Florida and asked her to evaluate in a week or two and send deep wound cultures if there is infection to look for bacteria so that we can decide on the correct antibiotic. We dont need to give you antibiotic today

## 2020-08-29 ENCOUNTER — Other Ambulatory Visit: Payer: Self-pay

## 2020-08-29 ENCOUNTER — Ambulatory Visit: Payer: Medicare PPO | Attending: Infectious Diseases | Admitting: Infectious Diseases

## 2020-08-29 ENCOUNTER — Encounter: Payer: Self-pay | Admitting: Infectious Diseases

## 2020-08-29 VITALS — BP 101/67 | HR 80 | Temp 98.1°F | Resp 16 | Ht 67.0 in | Wt 205.0 lb

## 2020-08-29 DIAGNOSIS — Z79890 Hormone replacement therapy (postmenopausal): Secondary | ICD-10-CM | POA: Insufficient documentation

## 2020-08-29 DIAGNOSIS — E039 Hypothyroidism, unspecified: Secondary | ICD-10-CM | POA: Diagnosis not present

## 2020-08-29 DIAGNOSIS — Z880 Allergy status to penicillin: Secondary | ICD-10-CM | POA: Insufficient documentation

## 2020-08-29 DIAGNOSIS — L089 Local infection of the skin and subcutaneous tissue, unspecified: Secondary | ICD-10-CM | POA: Insufficient documentation

## 2020-08-29 DIAGNOSIS — L84 Corns and callosities: Secondary | ICD-10-CM | POA: Insufficient documentation

## 2020-08-29 DIAGNOSIS — Z6832 Body mass index (BMI) 32.0-32.9, adult: Secondary | ICD-10-CM | POA: Insufficient documentation

## 2020-08-29 DIAGNOSIS — Z79899 Other long term (current) drug therapy: Secondary | ICD-10-CM | POA: Insufficient documentation

## 2020-08-29 DIAGNOSIS — I739 Peripheral vascular disease, unspecified: Secondary | ICD-10-CM | POA: Diagnosis not present

## 2020-08-29 DIAGNOSIS — Z79891 Long term (current) use of opiate analgesic: Secondary | ICD-10-CM | POA: Insufficient documentation

## 2020-08-29 DIAGNOSIS — I10 Essential (primary) hypertension: Secondary | ICD-10-CM | POA: Diagnosis not present

## 2020-08-29 DIAGNOSIS — E669 Obesity, unspecified: Secondary | ICD-10-CM | POA: Insufficient documentation

## 2020-08-29 DIAGNOSIS — F319 Bipolar disorder, unspecified: Secondary | ICD-10-CM | POA: Insufficient documentation

## 2020-08-29 DIAGNOSIS — G629 Polyneuropathy, unspecified: Secondary | ICD-10-CM | POA: Diagnosis not present

## 2020-08-29 DIAGNOSIS — Z9884 Bariatric surgery status: Secondary | ICD-10-CM | POA: Diagnosis not present

## 2020-08-29 DIAGNOSIS — B351 Tinea unguium: Secondary | ICD-10-CM | POA: Insufficient documentation

## 2020-08-29 NOTE — Patient Instructions (Addendum)
You are here for the left great toe callus and infection- you just finished bactrim bactrim -please  see Podiatrist  Dr.Fowler 08/31/20 at 9.30 am [facility]. [phone]. We will decide on further management after that

## 2020-08-29 NOTE — Progress Notes (Signed)
NAME: Jacqueline Williams  DOB: [birth year]  MRN: [id number]  Date/Time: 08/29/2020 10:20 AM   Subjective:  Patient is here with her daughter. Jacqueline Williams is a 69 y.o. female with history of poor gait, neuropathy because of lumbar stenosis and claudication leading to surgery  Is here for left toe infection. I seen initially the patient in May 2021.  she initially had some callus on the left toe and was noted to have some redness and hence antibiotics were given this was likely in January February of this year.  The PCP also noted ingrowing toenail.  As it was not improving she was referred to podiatrist who debrided the callus and as far as I understand there was a wound after that which progressed.  An MRI of the foot was done on 03/11/2020 and it showed abnormal edema and enhancement of the distal phalanx of the great toe.   So she had a biopsy of the bone on 04/04/2020 which showed chronic osteomyelitis.  But no bone was sent for microbiology. Patient has received multiple courses of doxycycline and as per patient she has not had much improvement. In May 2021 when I saw her her toe was quiesced sent. In August 2021 the right left great toe flared up again with redness and pain and swelling.      Her PCP sent CRP which was 1.6, ESR was 9 and wound culture was staph aureus.  She was put on Bactrim and she is finished it 2 days ago.  The swelling and erythema has improved a lot. She is here for  further management  Past medical history Bipolar disorder Obesity with gastric bypass Polyneuropathy Spinal stenosis of lumbar region leading to surgery. Essential hypertension    Past Surgical History:  Procedure Laterality Date  . ABDOMINAL HYSTERECTOMY    . CHOLECYSTECTOMY    . COLONOSCOPY WITH PROPOFOL N/A 04/30/2017   Procedure: COLONOSCOPY WITH PROPOFOL;  Surgeon: Wyline Mood, MD;  Location: [facility] END[facility]COPY;  Service: Endoscopy;  Laterality: N/A;  . ENTEROSCOPY N/A 05/09/2017   Procedure:  ENTEROSCOPY with deplopyement of capsule for small bowel;  Surgeon: Wyline Mood, MD;  Location: [facility] ENDOSCOPY;  Service: Endoscopy;  Laterality: N/A;  . ESOPHAGOGASTRODUODENOSCOPY (EGD) WITH PROPOFOL N/A 05/03/2017   Procedure: ESOPHAGOGASTRODUODENOSCOPY (EGD) WITH PROPOFOL;  Surgeon: Wyline Mood, MD;  Location: [facility] ENDOSCOPY;  Service: Gastroenterology;  Laterality: N/A;  . gastric bipass    . GIVENS CAPSULE STUDY N/A 05/07/2017   Procedure: GIVENS CAPSULE STUDY;  Surgeon: Wyline Mood, MD;  Location: [facility] ENDOSCOPY;  Service: Endoscopy;  Laterality: N/A;  . HERNIA REPAIR      Social History   Socioeconomic History  . Marital status: Widowed    Spouse name: Not on file  . Number of children: Not on file  . Years of education: Not on file  . Highest education level: Not on file  Occupational History  . Occupation: home maker  Tobacco Use  . Smoking status: Never Smoker  . Smokeless tobacco: Never Used  Vaping Use  . Vaping Use: Never used  Substance and Sexual Activity  . Alcohol use: No  . Drug use: No  . Sexual activity: Not on file  Other Topics Concern  . Not on file  Social History Narrative  . Not on file   Social Determinants of Health   Financial Resource Strain:   . Difficulty of Paying Living Expenses: Not on file  Food Insecurity:   . Worried About Programme researcher, broadcasting/film/video  in the Last Year: Not on file  . Ran Out of Food in the Last Year: Not on file  Transportation Needs:   . Lack of Transportation (Medical): Not on file  . Lack of Transportation (Non-Medical): Not on file  Physical Activity:   . Days of Exercise per Week: Not on file  . Minutes of Exercise per Session: Not on file  Stress:   . Feeling of Stress : Not on file  Social Connections:   . Frequency of Communication with Friends and Family: Not on file  . Frequency of Social Gatherings with Friends and Family: Not on file  . Attends Religious Services: Not on file  . Active Member of Clubs or  Organizations: Not on file  . Attends Archivist Meetings: Not on file  . Marital Status: Not on file  Intimate Partner Violence:   . Fear of Current or Ex-Partner: Not on file  . Emotionally Abused: Not on file  . Physically Abused: Not on file  . Sexually Abused: Not on file    Family History  Problem Relation Age of Onset  . Lung cancer Father   . Hypertension Neg Hx   . Diabetes Neg Hx    Allergies  Allergen Reactions  . Other Shortness Of Breath    caffergot  . Penicillin G Sodium Anaphylaxis  . Aspirin Other (See Comments)  . Ergotamine Tartrate Other (See Comments)  . Ibuprofen Other (See Comments)  . Ketorolac Tromethamine Nausea Only  . Naproxen Nausea Only  . Olanzapine Other (See Comments)  . Oxaprozin Other (See Comments)  . Penicillins   . Seroquel [Quetiapine Fumarate] Other (See Comments)    hallucinations   ? Current Outpatient Medications  Medication Sig Dispense Refill  . acetaminophen (TYLENOL) 500 MG tablet Take 500 mg by mouth every 6 (six) hours as needed.    . calcium-vitamin D (OSCAL WITH D) 500-200 MG-UNIT tablet Take 1 tablet by mouth.    . cetirizine (ZYRTEC) 10 MG tablet Take 10 mg by mouth daily.    Marland Kitchen CREON 24000-76000 units CPEP     . cyanocobalamin (,VITAMIN B-12,) 1000 MCG/ML injection Inject 1 mL into the skin every 30 (thirty) days.    Marland Kitchen DEPO-ESTRADIOL 5 MG/ML injection     . ferrous gluconate (FERGON) 324 MG tablet Take 1 tablet (324 mg total) by mouth 3 (three) times daily with meals. 90 tablet 3  . fluticasone (FLONASE) 50 MCG/ACT nasal spray Place 1 spray into both nostrils daily as needed for allergies or rhinitis.    . furosemide (LASIX) 20 MG tablet     . gabapentin (NEURONTIN) 100 MG capsule Take 100 mg by mouth 2 (two) times daily.    Marland Kitchen lactase (LACTAID) 3000 units tablet Take by mouth 3 (three) times daily with meals.    Marland Kitchen levothyroxine (SYNTHROID, LEVOTHROID) 25 MCG tablet Take 25 mcg by mouth daily.    Marland Kitchen loperamide  (IMODIUM) 2 MG capsule Take by mouth as needed for diarrhea or loose stools.    Marland Kitchen LORazepam (ATIVAN) 1 MG tablet Take 0.5-1 mg by mouth 4 (four) times daily. 0.5 mg 0630,  1 mg 1100, 0.5 mg 1600, 1 mg 2030    . Melatonin 3 MG CAPS Take by mouth.    . oxybutynin (DITROPAN) 5 MG tablet Take 5 mg by mouth 2 (two) times daily.    Marland Kitchen oxyCODONE-acetaminophen (PERCOCET) 5-325 MG tablet Take 1 tablet by mouth 4 (four) times daily. 0630,1100,1600,2030    .  pantoprazole (PROTONIX) 40 MG tablet Take 40 mg by mouth daily.    . potassium chloride (MICRO-K) 10 MEQ CR capsule     . promethazine (PHENERGAN) 25 MG tablet Take 25 mg by mouth every 6 (six) hours as needed.    . risperidone (RISPERDAL) 4 MG tablet Take 4 mg by mouth daily.    . simethicone (MYLICON) 80 MG chewable tablet Chew 80 mg by mouth every 6 (six) hours as needed for flatulence.    . traZODone (DESYREL) 100 MG tablet Take 100 mg by mouth at bedtime.    Marland Kitchen venlafaxine XR (EFFEXOR-XR) 150 MG 24 hr capsule Take 150 mg by mouth daily.     No current facility-administered medications for this visit.     Abtx:  Anti-infectives (From admission, onward)   None      REVIEW OF SYSTEMS:  Const: negative fever, negative chills, negative weight loss Eyes: negative diplopia or visual changes, negative eye pain ENT: negative coryza, negative sore throat Resp: negative cough, hemoptysis, dyspnea Cards: negative for chest pain, palpitations, lower extremity edema GU: negative for frequency, dysuria and hematuria GI: Negative for abdominal pain, diarrhea, bleeding, constipation Skin: negative for rash and pruritus Heme: negative for easy bruising and gum/nose bleeding MS: Back pain, leg pain, poor mobility. Neurolo:negative for headaches, dizziness, vertigo, has memory problems Psych: negative for feelings of anxiety, depression  Endocrine: negative for thyroid, diabetes Allergy/Immunology-multiple allergies as listed above   Objective:    VITALS:  BP 101/67   Pulse 80   Temp 98.1 F (36.7 C)   Resp 16   Ht _0  (1.702 m)   Wt 205 lb (93 kg)   SpO2 96%   BMI 32.11 kg/m  PHYSICAL EXAM:  General: Alert, cooperative, no distress, appears stated age.  Lungs: Bilateral air entry clear heart: Regular rate and rhythm, no murmur, rub or gallop. Abdomen: Did not examine Extremities: Left great toe and foot has some edema, no erythema, no discharge and no tenderness Callus on the plantar aspect.          Right great toe on the metatarsal phalangeal joint has callus  Skin: No rashes or lesions. Or bruising Lymph: Cervical, supraclavicular normal. Neurologic: Grossly non-focal Pertinent Labs Reviewed as above  ? Impression/Recommendation ? Left great toe callus and possible chronic osteomyelitis of the toe. The toe is not overtly infected. There is callus and no obviously infected wound present other than a small opening There is onychomycosis of the toenails There is fungal infection of the webspace The bone biopsy has shown chronic osteomyelitis No bone culture was done On 08/23/2019 when there was staph aureus] MSSA in the wound. Patient has completed a course of antibiotic. She needs to see the podiatrist to see with a week she needs any surgical intervention along with antibiotics for further management of this foot. I communicated with Dr. Vickki Muff at [facility] will be seeing her on Thursday at 9:30 AM. Discussed the management with the patient and her daughter who was at the visit.  Bipolar disorder On Risperdal, venlafaxine and trazodone.  So linezolid is not an option for her.  Hypothyroidism on Synthroid. We will follow her after she sees Dr. Vickki Muff ? ___ Note:  This document was prepared using Dragon voice recognition software and may include unintentional dictation errors.

## 2020-09-04 ENCOUNTER — Other Ambulatory Visit: Payer: Self-pay | Admitting: Podiatry

## 2020-09-11 ENCOUNTER — Other Ambulatory Visit: Payer: Self-pay

## 2020-09-11 ENCOUNTER — Other Ambulatory Visit
Admission: RE | Admit: 2020-09-11 | Discharge: 2020-09-11 | Disposition: A | Discharge status: Home / Self Care | Payer: Medicare PPO | Source: Ambulatory Visit | Attending: Podiatry | Admitting: Podiatry

## 2020-09-11 HISTORY — DX: Deficiency of other specified B group vitamins: E53.8

## 2020-09-11 HISTORY — DX: Anxiety disorder, unspecified: F41.9

## 2020-09-11 HISTORY — DX: Sepsis, unspecified organism: A41.9

## 2020-09-11 HISTORY — DX: Essential (primary) hypertension: I10

## 2020-09-11 HISTORY — DX: Polyneuropathy, unspecified: G62.9

## 2020-09-11 HISTORY — DX: Hypothyroidism, unspecified: E03.9

## 2020-09-11 HISTORY — DX: Bipolar disorder, unspecified: F31.9

## 2020-09-11 NOTE — Patient Instructions (Addendum)
Your procedure is scheduled on: Friday, September 24 Report to Day Surgery on the 2nd floor of the [facility] To find out your arrival time, please call [phone] between 1PM - 3PM on: Thursday, Sept. 23  REMEMBER: Instructions that are not followed completely may result in serious medical risk, up to and including death; or upon the discretion of your surgeon and anesthesiologist your surgery may need to be rescheduled.  Do not eat food after midnight the night before surgery.  No gum chewing, lozengers or hard candies.  You may however, drink CLEAR liquids up to 2 hours before you are scheduled to arrive for your surgery. Do not drink anything within 2 hours of your scheduled arrival time.  Clear liquids include: - water  - apple juice without pulp - gatorade (not RED) - black coffee or tea (Do NOT add milk or creamers to the coffee or tea) Do NOT drink anything that is not on this list.  In addition, your doctor has ordered for you to drink the provided  Ensure Pre-Surgery Clear Carbohydrate Drink  Drinking this carbohydrate drink up to two hours before surgery helps to reduce insulin resistance and improve patient outcomes. Please complete drinking 2 hours prior to scheduled arrival time.  TAKE THESE MEDICATIONS THE MORNING OF SURGERY WITH A SIP OF WATER:  1.  Oxycodone 2.  Levothyroxine 3.  Lorazepam 4.  Pantoprazole - (take one the night before and one on the morning of surgery - helps to prevent nausea after surgery.)  One week prior to surgery: Stop Anti-inflammatories (NSAIDS) such as Advil, Aleve, Ibuprofen, Motrin, Naproxen, Naprosyn and Aspirin based products such as Excedrin, Goodys Powder, BC Powder. Stop ANY OVER THE COUNTER supplements until after surgery. (You may continue taking Tylenol, Vitamin B.)  No Alcohol for 24 hours before or after surgery.  On the morning of surgery brush your teeth with toothpaste and water, you may rinse your mouth with  mouthwash if you wish. Do not swallow any toothpaste or mouthwash.  Do not wear jewelry, make-up, hairpins, clips or nail polish.  Do not wear lotions, powders, or perfumes.   Do not shave 48 hours prior to surgery.   Dentures may not be worn into surgery.  Do not bring valuables to the hospital. [facility] is not responsible for any missing/lost belongings or valuables.   Use CHG Soap or wipes as directed on instruction sheet.  Notify your doctor if there is any change in your medical condition (cold, fever, infection).  Wear comfortable clothing (specific to your surgery type) to the hospital.  Plan for stool softeners for home use; pain medications have a tendency to cause constipation. You can also help prevent constipation by eating foods high in fiber such as fruits and vegetables and drinking plenty of fluids as your diet allows.  After surgery, you can help prevent lung complications by doing breathing exercises.  Take deep breaths and cough every 1-2 hours. Your doctor may order a device called an Incentive Spirometer to help you take deep breaths.  If you are being discharged the day of surgery, you will not be allowed to drive home. You will need a responsible adult (18 years or older) to drive you home and stay with you that night.   Please call the Pre-admissions Testing Dept. at [phone] if you have any questions about these instructions.  Visitation Policy:  Patients undergoing a surgery or procedure may have one family member or support person with them  as long as that person is not COVID-19 positive or experiencing its symptoms.  That person may remain in the waiting area during the procedure.  Masking is required regardless of vaccination status.

## 2020-09-13 ENCOUNTER — Encounter
Admission: RE | Admit: 2020-09-13 | Discharge: 2020-09-13 | Disposition: A | Discharge status: Home / Self Care | Payer: Medicare PPO | Source: Ambulatory Visit | Attending: Podiatry | Admitting: Podiatry

## 2020-09-13 ENCOUNTER — Other Ambulatory Visit: Payer: Medicare PPO

## 2020-09-13 ENCOUNTER — Other Ambulatory Visit: Payer: Self-pay

## 2020-09-13 DIAGNOSIS — Z20822 Contact with and (suspected) exposure to covid-19: Secondary | ICD-10-CM | POA: Insufficient documentation

## 2020-09-13 DIAGNOSIS — Z01818 Encounter for other preprocedural examination: Secondary | ICD-10-CM | POA: Diagnosis not present

## 2020-09-13 DIAGNOSIS — I10 Essential (primary) hypertension: Secondary | ICD-10-CM | POA: Diagnosis not present

## 2020-09-13 LAB — BASIC METABOLIC PANEL
Anion gap: 9 (ref 5–15)
BUN: 11 mg/dL (ref 8–23)
CO2: 28 mmol/L (ref 22–32)
Calcium: 8.7 mg/dL — ABNORMAL LOW (ref 8.9–10.3)
Chloride: 104 mmol/L (ref 98–111)
Creatinine, Ser: 1.27 mg/dL — ABNORMAL HIGH (ref 0.44–1.00)
GFR calc Af Amer: 51 mL/min — ABNORMAL LOW (ref >60)
GFR calc non Af Amer: 44 mL/min — ABNORMAL LOW (ref >60)
Glucose, Bld: 121 mg/dL — ABNORMAL HIGH (ref 70–99)
Potassium: 3.7 mmol/L (ref 3.5–5.1)
Sodium: 141 mmol/L (ref 135–145)

## 2020-09-13 LAB — SARS CORONAVIRUS 2 (TAT 6-24 HRS): SARS Coronavirus 2: NEGATIVE

## 2020-09-15 ENCOUNTER — Ambulatory Visit: Payer: Medicare PPO | Admitting: Anesthesiology

## 2020-09-15 ENCOUNTER — Ambulatory Visit
Admission: RE | Admit: 2020-09-15 | Discharge: 2020-09-15 | Disposition: A | Discharge status: Home / Self Care | Payer: Medicare PPO | Attending: Podiatry | Admitting: Podiatry

## 2020-09-15 ENCOUNTER — Encounter
Admission: RE | Disposition: A | Discharge status: Home / Self Care | Payer: Self-pay | Source: Home / Self Care | Attending: Podiatry

## 2020-09-15 ENCOUNTER — Encounter: Payer: Self-pay | Admitting: Podiatry

## 2020-09-15 DIAGNOSIS — Z880 Allergy status to penicillin: Secondary | ICD-10-CM | POA: Diagnosis not present

## 2020-09-15 DIAGNOSIS — Z9884 Bariatric surgery status: Secondary | ICD-10-CM | POA: Diagnosis not present

## 2020-09-15 DIAGNOSIS — M1990 Unspecified osteoarthritis, unspecified site: Secondary | ICD-10-CM | POA: Diagnosis not present

## 2020-09-15 DIAGNOSIS — I10 Essential (primary) hypertension: Secondary | ICD-10-CM | POA: Insufficient documentation

## 2020-09-15 DIAGNOSIS — M869 Osteomyelitis, unspecified: Secondary | ICD-10-CM | POA: Diagnosis not present

## 2020-09-15 DIAGNOSIS — G629 Polyneuropathy, unspecified: Secondary | ICD-10-CM | POA: Diagnosis not present

## 2020-09-15 DIAGNOSIS — Z79899 Other long term (current) drug therapy: Secondary | ICD-10-CM | POA: Insufficient documentation

## 2020-09-15 DIAGNOSIS — Z79890 Hormone replacement therapy (postmenopausal): Secondary | ICD-10-CM | POA: Insufficient documentation

## 2020-09-15 DIAGNOSIS — Z888 Allergy status to other drugs, medicaments and biological substances status: Secondary | ICD-10-CM | POA: Insufficient documentation

## 2020-09-15 DIAGNOSIS — Z87891 Personal history of nicotine dependence: Secondary | ICD-10-CM | POA: Diagnosis not present

## 2020-09-15 DIAGNOSIS — E079 Disorder of thyroid, unspecified: Secondary | ICD-10-CM | POA: Diagnosis not present

## 2020-09-15 HISTORY — PX: AMPUTATION TOE: SHX6595

## 2020-09-15 SURGERY — AMPUTATION, TOE
Anesthesia: General | Site: Toe | Laterality: Left

## 2020-09-15 MED ORDER — PROPOFOL 10 MG/ML IV BOLUS
INTRAVENOUS | Status: AC
  Filled 2020-09-15: qty 20

## 2020-09-15 MED ORDER — BUPIVACAINE HCL (PF) 0.5 % IJ SOLN
INTRAMUSCULAR | Status: AC
  Filled 2020-09-15: qty 30

## 2020-09-15 MED ORDER — ACETAMINOPHEN 10 MG/ML IV SOLN: 1000.0000 mg | Freq: Once | INTRAVENOUS | Status: DC | PRN

## 2020-09-15 MED ORDER — VASOPRESSIN 20 UNIT/ML IV SOLN
INTRAVENOUS | Status: DC | PRN
  Administered 2020-09-15 (×3): 1 [IU] via INTRAVENOUS

## 2020-09-15 MED ORDER — HYDROMORPHONE HCL 1 MG/ML IJ SOLN
INTRAMUSCULAR | Status: AC
  Filled 2020-09-15: qty 1

## 2020-09-15 MED ORDER — EPHEDRINE SULFATE 50 MG/ML IJ SOLN
INTRAMUSCULAR | Status: DC | PRN
  Administered 2020-09-15 (×2): 10 mg via INTRAVENOUS

## 2020-09-15 MED ORDER — SULFAMETHOXAZOLE-TRIMETHOPRIM 800-160 MG PO TABS: 1.0000 | ORAL_TABLET | Freq: Two times a day (BID) | ORAL | 0 refills | Status: AC

## 2020-09-15 MED ORDER — FENTANYL CITRATE (PF) 100 MCG/2ML IJ SOLN
INTRAMUSCULAR | Status: DC | PRN
Start: 2020-09-15 — End: 2020-09-15
  Administered 2020-09-15: 25 ug via INTRAVENOUS

## 2020-09-15 MED ORDER — ORAL CARE MOUTH RINSE: 15.0000 mL | Freq: Once | OROMUCOSAL | Status: AC

## 2020-09-15 MED ORDER — OXYCODONE HCL 5 MG PO TABS: 5.0000 mg | ORAL_TABLET | Freq: Once | ORAL | Status: DC | PRN

## 2020-09-15 MED ORDER — FENTANYL CITRATE (PF) 100 MCG/2ML IJ SOLN
INTRAMUSCULAR | Status: AC
  Filled 2020-09-15: qty 2

## 2020-09-15 MED ORDER — CHLORHEXIDINE GLUCONATE 0.12 % MT SOLN
OROMUCOSAL | Status: AC
  Filled 2020-09-15: qty 15

## 2020-09-15 MED ORDER — PROPOFOL 10 MG/ML IV BOLUS
INTRAVENOUS | Status: DC | PRN
  Administered 2020-09-15: 100 mg via INTRAVENOUS

## 2020-09-15 MED ORDER — HYDROMORPHONE HCL 1 MG/ML IJ SOLN
INTRAMUSCULAR | Status: DC | PRN
  Administered 2020-09-15: .1 mg via INTRAVENOUS

## 2020-09-15 MED ORDER — ACETAMINOPHEN 10 MG/ML IV SOLN
INTRAVENOUS | Status: AC
  Filled 2020-09-15: qty 100

## 2020-09-15 MED ORDER — BUPIVACAINE HCL 0.5 % IJ SOLN
INTRAMUSCULAR | Status: DC | PRN
  Administered 2020-09-15: 2 mL

## 2020-09-15 MED ORDER — ACETAMINOPHEN 10 MG/ML IV SOLN
INTRAVENOUS | Status: DC | PRN
  Administered 2020-09-15: 1000 mg via INTRAVENOUS

## 2020-09-15 MED ORDER — LIDOCAINE HCL (PF) 1 % IJ SOLN
INTRAMUSCULAR | Status: AC
  Filled 2020-09-15: qty 30

## 2020-09-15 MED ORDER — FENTANYL CITRATE (PF) 100 MCG/2ML IJ SOLN: 25.0000 ug | INTRAMUSCULAR | Status: DC | PRN

## 2020-09-15 MED ORDER — LIDOCAINE HCL (PF) 1 % IJ SOLN
INTRAMUSCULAR | Status: DC | PRN
  Administered 2020-09-15: 2 mL

## 2020-09-15 MED ORDER — LACTATED RINGERS IV SOLN: INTRAVENOUS | Status: DC

## 2020-09-15 MED ORDER — POVIDONE-IODINE 7.5 % EX SOLN
Freq: Once | CUTANEOUS | Status: AC
  Filled 2020-09-15: qty 118

## 2020-09-15 MED ORDER — CHLORHEXIDINE GLUCONATE 0.12 % MT SOLN
15.0000 mL | Freq: Once | OROMUCOSAL | Status: AC
  Administered 2020-09-15: 15 mL via OROMUCOSAL

## 2020-09-15 MED ORDER — OXYCODONE HCL 5 MG/5ML PO SOLN: 5.0000 mg | Freq: Once | ORAL | Status: DC | PRN

## 2020-09-15 MED ORDER — LIDOCAINE HCL (CARDIAC) PF 100 MG/5ML IV SOSY
PREFILLED_SYRINGE | INTRAVENOUS | Status: DC | PRN
  Administered 2020-09-15: 80 mg via INTRAVENOUS

## 2020-09-15 MED ORDER — ONDANSETRON HCL 4 MG/2ML IJ SOLN: 4.0000 mg | Freq: Once | INTRAMUSCULAR | Status: DC | PRN

## 2020-09-15 MED ORDER — GLYCOPYRROLATE 0.2 MG/ML IJ SOLN
INTRAMUSCULAR | Status: DC | PRN
  Administered 2020-09-15: .2 mg via INTRAVENOUS

## 2020-09-15 MED ORDER — OXYCODONE-ACETAMINOPHEN 5-325 MG PO TABS: 1.0000 | ORAL_TABLET | Freq: Four times a day (QID) | ORAL | 0 refills | Status: AC | PRN

## 2020-09-15 MED ORDER — PHENYLEPHRINE HCL (PRESSORS) 10 MG/ML IV SOLN
INTRAVENOUS | Status: DC | PRN
  Administered 2020-09-15: 100 ug via INTRAVENOUS

## 2020-09-15 SURGICAL SUPPLY — 57 items
BLADE OSC/SAGITTAL MD 5.5X18 (BLADE) ×2 IMPLANT
BLADE OSCILLATING/SAGITTAL (BLADE) ×2
BLADE SURG MINI STRL (BLADE) ×2 IMPLANT
BLADE SW THK.38XMED LNG THN (BLADE) ×1 IMPLANT
BNDG CMPR STD VLCR NS LF 5.8X4 (GAUZE/BANDAGES/DRESSINGS) ×1
BNDG COHESIVE 4X5 TAN STRL (GAUZE/BANDAGES/DRESSINGS) ×2 IMPLANT
BNDG CONFORM 2 STRL LF (GAUZE/BANDAGES/DRESSINGS) ×2 IMPLANT
BNDG CONFORM 3 STRL LF (GAUZE/BANDAGES/DRESSINGS) ×4 IMPLANT
BNDG ELASTIC 3X5.8 VLCR STR LF (GAUZE/BANDAGES/DRESSINGS) ×2 IMPLANT
BNDG ELASTIC 4X5.8 VLCR NS LF (GAUZE/BANDAGES/DRESSINGS) ×2 IMPLANT
BNDG ESMARK 4X12 TAN STRL LF (GAUZE/BANDAGES/DRESSINGS) ×2 IMPLANT
BNDG GAUZE 4.5X4.1 6PLY STRL (MISCELLANEOUS) ×2 IMPLANT
CANISTER SUCT 1200ML W/VALVE (MISCELLANEOUS) ×2 IMPLANT
CNTNR SPEC 2.5X3XGRAD LEK (MISCELLANEOUS) ×1
CONT SPEC 4OZ STER OR WHT (MISCELLANEOUS) ×1
CONT SPEC 4OZ STRL OR WHT (MISCELLANEOUS) ×1
CONTAINER SPEC 2.5X3XGRAD LEK (MISCELLANEOUS) ×1 IMPLANT
COVER WAND RF STERILE (DRAPES) ×2 IMPLANT
CUFF TOURN SGL QUICK 12 (TOURNIQUET CUFF) IMPLANT
CUFF TOURN SGL QUICK 18X4 (TOURNIQUET CUFF) IMPLANT
DRAPE FLUOR MINI C-ARM 54X84 (DRAPES) ×2 IMPLANT
DRAPE XRAY CASSETTE 23X24 (DRAPES) ×2 IMPLANT
DURAPREP 26ML APPLICATOR (WOUND CARE) ×2 IMPLANT
ELECT REM PT RETURN 9FT ADLT (ELECTROSURGICAL) ×2
ELECTRODE REM PT RTRN 9FT ADLT (ELECTROSURGICAL) ×1 IMPLANT
GAUZE PACKING IODOFORM 1/2 (PACKING) ×2 IMPLANT
GAUZE SPONGE 4X4 12PLY STRL (GAUZE/BANDAGES/DRESSINGS) ×2 IMPLANT
GAUZE XEROFORM 1X8 LF (GAUZE/BANDAGES/DRESSINGS) ×2 IMPLANT
GAUZE XEROFORM 4X4 STRL (GAUZE/BANDAGES/DRESSINGS) ×2 IMPLANT
GLOVE BIO SURGEON STRL SZ7.5 (GLOVE) ×2 IMPLANT
GLOVE INDICATOR 8.0 STRL GRN (GLOVE) ×2 IMPLANT
GOWN STRL REUS W/ TWL XL LVL3 (GOWN DISPOSABLE) ×2 IMPLANT
GOWN STRL REUS W/TWL XL LVL3 (GOWN DISPOSABLE) ×4
HEMOSTAT SURGICEL 2X3 (HEMOSTASIS) ×2 IMPLANT
KIT TURNOVER KIT A (KITS) ×2 IMPLANT
LABEL OR SOLS (LABEL) ×2 IMPLANT
NEEDLE FILTER BLUNT 18X 1/2SAF (NEEDLE) ×1
NEEDLE FILTER BLUNT 18X1 1/2 (NEEDLE) ×1 IMPLANT
NEEDLE HYPO 25X1 1.5 SAFETY (NEEDLE) ×2 IMPLANT
NS IRRIG 500ML POUR BTL (IV SOLUTION) ×2 IMPLANT
PACK EXTREMITY (MISCELLANEOUS) ×2 IMPLANT
PAD ABD DERMACEA PRESS 5X9 (GAUZE/BANDAGES/DRESSINGS) ×4 IMPLANT
PULSAVAC PLUS IRRIG FAN TIP (DISPOSABLE) ×2
SHIELD FULL FACE ANTIFOG 7M (MISCELLANEOUS) ×2 IMPLANT
SOL .9 NS 3000ML IRR  AL (IV SOLUTION) ×1
SOL .9 NS 3000ML IRR AL (IV SOLUTION) ×1
SOL .9 NS 3000ML IRR UROMATIC (IV SOLUTION) ×1 IMPLANT
STOCKINETTE M/LG 89821 (MISCELLANEOUS) ×2 IMPLANT
STRAP SAFETY 5IN WIDE (MISCELLANEOUS) ×2 IMPLANT
SUT ETHILON 3-0 FS-10 30 BLK (SUTURE) ×2
SUT ETHILON 5-0 FS-2 18 BLK (SUTURE) ×2 IMPLANT
SUT VIC AB 3-0 SH 27 (SUTURE) ×2
SUT VIC AB 3-0 SH 27X BRD (SUTURE) ×1 IMPLANT
SUT VIC AB 4-0 FS2 27 (SUTURE) ×2 IMPLANT
SUTURE EHLN 3-0 FS-10 30 BLK (SUTURE) ×1 IMPLANT
SYR 10ML LL (SYRINGE) ×6 IMPLANT
TIP FAN IRRIG PULSAVAC PLUS (DISPOSABLE) ×1 IMPLANT

## 2020-09-15 NOTE — H&P (Signed)
HISTORY AND PHYSICAL INTERVAL NOTE:  09/15/2020  7:28 AM  Jacqueline Williams  has presented today for surgery, with the diagnosis of M86.172 OSTEOMYELITIS LEFT TOE.  The various methods of treatment have been discussed with the patient.  No guarantees were given.  After consideration of risks, benefits and other options for treatment, the patient has consented to surgery.  I have reviewed the patients' chart and labs.     Williams history and physical examination was performed in my office.  The patient was reexamined.  There have been no changes to this history and physical examination.  Jacqueline Williams

## 2020-09-15 NOTE — Transfer of Care (Signed)
Immediate Anesthesia Transfer of Care Note  Patient: Jacqueline Williams  Procedure(s) Performed: AMPUTATION TOE MPJ LEFT (Left Toe)  Patient Location: PACU  Anesthesia Type:General  Level of Consciousness: awake and drowsy  Airway & Oxygen Therapy: Patient Spontanous Breathing and Patient connected to face mask oxygen  Post-op Assessment: Report given to RN and Post -op Vital signs reviewed and stable  Post vital signs: stable  Last Vitals:  Vitals Value Taken Time  BP 134/62 09/15/20 0838  Temp    Pulse 82 09/15/20 0839  Resp 19 09/15/20 0839  SpO2 100 % 09/15/20 0839  Vitals shown include unvalidated device data.  Last Pain:  Vitals:   09/15/20 0627  TempSrc: Temporal  PainSc: 9          Complications: No complications documented.

## 2020-09-15 NOTE — Op Note (Signed)
Operative note   Surgeon:Zayyan Mullen [employer] forces logistics/support/administrative officer: None    Preop diagnosis: Osteomyelitis left great toe    Postop diagnosis: Same    Procedure: Amputation left great toe and distal first metatarsal head.  Intraoperative fluoroscopy without assistance of radiology    EBL: 10 mL    Anesthesia:local and general.  Local consisted of 5 mL of lidocaine and 5 mL of 0.5% bupivacaine    Hemostasis: None    Specimen: Proximal phalanx for culture and great toe and distal metatarsal for pathology    Complications: None    Operative indications:Jacqueline Williams is an 69 y.o. that presents today for surgical intervention.  The risks/benefits/alternatives/complications have been discussed and consent has been given.    Procedure:  Patient was brought into the OR and placed on the operating table in thesupine position. After anesthesia was obtained theleft lower extremity was prepped and draped in usual sterile fashion.  Attention was directed to the distal left great toe where circumferences was made around the metatarsophalangeal joint.  Dorsal and plantar skin flaps were created.  Fair amount of infection was noted to the base of the proximal phalanx.  At this time the incision was carried back to the first metatarsal head.  The distal first metatarsal head was also removed at this time.  The toe was then disarticulated.  The wound was then flushed with copious amounts of irrigation.  Bleeders were Bovie cauterized as appropriate.  Surgicel was placed within the wound for further hemostasis.  Closure was performed with a combination of a 3-0 Vicryl and 3-0 nylon.  Intraoperative fluoroscopy was used both prior to the amputation as well as post amputation without the assistance of radiology.    Patient tolerated the procedure and anesthesia well.  Was transported from the OR to the PACU with all vital signs stable and vascular status intact. To be discharged per routine protocol.  Will follow up in  approximately 1 week in the outpatient clinic.

## 2020-09-15 NOTE — Anesthesia Preprocedure Evaluation (Signed)
Anesthesia Evaluation  Patient identified by MRN, date of birth, ID band Patient awake    Reviewed: Allergy & Precautions, NPO status , Patient's Chart, lab work & pertinent test results  History of Anesthesia Complications Negative for: history of anesthetic complications  Airway Mallampati: II  TM Distance: >3 FB Neck ROM: Full    Dental  (+) Edentulous Upper, Edentulous Lower, Dental Advisory Given   Pulmonary neg pulmonary ROS, neg sleep apnea, neg COPD, Patient abstained from smoking.Not current smoker,    Pulmonary exam normal breath sounds clear to auscultation       Cardiovascular Exercise Tolerance: Good METShypertension, (-) CAD and (-) Past MI (-) dysrhythmias  Rhythm:Regular Rate:Normal - Systolic murmurs    Neuro/Psych PSYCHIATRIC DISORDERS Anxiety Depression Bipolar Disorder negative neurological ROS  negative psych ROS   GI/Hepatic neg GERD  ,(+)     (-) substance abuse  ,   Endo/Other  neg diabetesHypothyroidism   Renal/GU CRFRenal diseasenegative Renal ROS     Musculoskeletal   Abdominal   Peds  Hematology   Anesthesia Other Findings Past Medical History: No date: Anxiety No date: Back pain No date: Bipolar disorder (HCC) No date: Depression No date: Hypertension No date: Hypothyroidism No date: Neuropathy 2021: Osteomyelitis of great toe of left foot (HCC) No date: Sepsis (HCC) No date: Vitamin B12 deficiency  Reproductive/Obstetrics                             Anesthesia Physical Anesthesia Plan  ASA: II  Anesthesia Plan: General   Post-op Pain Management:    Induction: Intravenous  PONV Risk Score and Plan: 3 and Ondansetron and Dexamethasone  Airway Management Planned: LMA  Additional Equipment: None  Intra-op Plan:   Post-operative Plan: Extubation in OR  Informed Consent: I have reviewed the patients History and Physical, chart, labs and  discussed the procedure including the risks, benefits and alternatives for the proposed anesthesia with the patient or authorized representative who has indicated his/her understanding and acceptance.     Dental advisory given  Plan Discussed with: CRNA and Surgeon  Anesthesia Plan Comments: (Discussed risks of anesthesia with patient, including PONV, sore throat, lip/dental damage. Rare risks discussed as well, such as cardiorespiratory and neurological sequelae. Patient understands.)       Anesthesia Quick Evaluation

## 2020-09-15 NOTE — Anesthesia Procedure Notes (Signed)
Procedure Name: LMA Insertion Date/Time: 09/15/2020 7:39 AM Performed by: Rodney Booze, CRNA Pre-anesthesia Checklist: Patient identified, Emergency Drugs available, Suction available, Patient being monitored and Timeout performed Patient Re-evaluated:Patient Re-evaluated prior to induction Oxygen Delivery Method: Circle system utilized Preoxygenation: Pre-oxygenation with 100% oxygen Induction Type: IV induction Ventilation: Mask ventilation without difficulty LMA: LMA flexible inserted LMA Size: 4.0 Dental Injury: Teeth and Oropharynx as per pre-operative assessment

## 2020-09-15 NOTE — Discharge Instructions (Signed)
AMBULATORY SURGERY  °DISCHARGE INSTRUCTIONS ° ° °1) The drugs that you were given will stay in your system until tomorrow so for the next 24 hours you should not: ° °A) Drive an automobile °B) Make any legal decisions °C) Drink any alcoholic beverage ° ° °2) You may resume regular meals tomorrow.  Today it is better to start with liquids and gradually work up to solid foods. ° °You may eat anything you prefer, but it is better to start with liquids, then soup and crackers, and gradually work up to solid foods. ° ° °3) Please notify your doctor immediately if you have any unusual bleeding, trouble breathing, redness and pain at the surgery site, drainage, fever, or pain not relieved by medication. °4)  ° °5) Your post-operative visit with Dr.                     °           °     is: Date:                        Time:   ° °Please call to schedule your post-operative visit. ° °6) Additional Instructions: ° ° ° ° °[facility] °[facility] ° °POST OPERATIVE INSTRUCTIONS FOR DR. TROXLER, DR. FOWLER, AND DR. BAKER °[facility] ° ° °1. Take your medication as prescribed.  Pain medication should be taken only as needed. ° °2. Keep the dressing clean, dry and intact. ° °3. Keep your foot elevated above the heart level for the first 48 hours. ° °4. Walking to the bathroom and brief periods of walking are acceptable, unless we have instructed you to be non-weight bearing. ° °5. Always wear your post-op shoe when walking.  Always use your crutches if you are to be non-weight bearing. ° °6. Do not take a shower. Baths are permissible as long as the foot is kept out of the water.  ° °7. Every hour you are awake:  °- Bend your knee 15 times. °- Flex foot 15 times °- Massage calf 15 times ° °8. Call [facility] ([phone]) if any of the following problems occur: °- You develop a temperature or fever. °- The bandage becomes saturated with blood. °- Medication does not  stop your pain. °- Injury of the foot occurs. °- Any symptoms of infection including redness, odor, or red streaks running from wound. ° °

## 2020-09-15 NOTE — Anesthesia Postprocedure Evaluation (Signed)
Anesthesia Post Note  Patient: Jacqueline Williams  Procedure(s) Performed: AMPUTATION TOE MPJ LEFT (Left Toe)  Patient location during evaluation: PACU Anesthesia Type: General Level of consciousness: awake and alert Pain management: pain level controlled Vital Signs Assessment: post-procedure vital signs reviewed and stable Respiratory status: spontaneous breathing, nonlabored ventilation, respiratory function stable and patient connected to nasal cannula oxygen Cardiovascular status: blood pressure returned to baseline and stable Postop Assessment: no apparent nausea or vomiting Anesthetic complications: no   No complications documented.   Last Vitals:  Vitals:   09/15/20 0923 09/15/20 0936  BP: (!) 113/47 (!) 121/46  Pulse: 89 88  Resp: 18 16  Temp: (!) 36 C 36.7 C  SpO2: 96% 97%    Last Pain:  Vitals:   09/15/20 0936  TempSrc: Temporal  PainSc: 0-No pain                 Corinda Gubler

## 2020-09-19 LAB — SURGICAL PATHOLOGY

## 2020-09-20 LAB — AEROBIC/ANAEROBIC CULTURE W GRAM STAIN (SURGICAL/DEEP WOUND)

## 2020-10-13 ENCOUNTER — Other Ambulatory Visit: Payer: Self-pay

## 2020-10-13 ENCOUNTER — Emergency Department
Admission: EM | Admit: 2020-10-13 | Discharge: 2020-10-14 | Disposition: A | Discharge status: Home / Self Care | Payer: Medicare PPO | Attending: Emergency Medicine | Admitting: Emergency Medicine

## 2020-10-13 ENCOUNTER — Emergency Department: Payer: Medicare PPO

## 2020-10-13 DIAGNOSIS — Z79899 Other long term (current) drug therapy: Secondary | ICD-10-CM | POA: Insufficient documentation

## 2020-10-13 DIAGNOSIS — R112 Nausea with vomiting, unspecified: Secondary | ICD-10-CM

## 2020-10-13 DIAGNOSIS — L539 Erythematous condition, unspecified: Secondary | ICD-10-CM | POA: Insufficient documentation

## 2020-10-13 DIAGNOSIS — E039 Hypothyroidism, unspecified: Secondary | ICD-10-CM | POA: Diagnosis not present

## 2020-10-13 DIAGNOSIS — I10 Essential (primary) hypertension: Secondary | ICD-10-CM | POA: Diagnosis not present

## 2020-10-13 NOTE — ED Provider Notes (Signed)
[facility] Emergency Department Provider Note   ____________________________________________   I have reviewed the triage vital signs and the nursing notes.   HISTORY  Chief Complaint Cellulitis   History limited by: Not Limited Some history obtained from daughter at bedside   HPI Jacqueline Williams is a 69 y.o. female who presents to the emergency department today from living facility due to nausea and vomiting. Apparently the patient had recently finished a cheeseburger when she started vomiting. Vomited multiple times. She denies any associated stomach pain with the vomiting. No blood reported in the vomit. Denies any diarrhea. No fevers. There is some concern that she is having worsening infection to her left 2nd toe. Recently underwent amputation of left 1st digit.    Records reviewed. Per medical record review patient has a history of HTN, hypothyroid, osteomyelitis.   Past Medical History:  Diagnosis Date  . Anxiety   . Back pain   . Bipolar disorder (HCC)   . Depression   . Hypertension   . Hypothyroidism   . Neuropathy   . Osteomyelitis of great toe of left foot (HCC) 2021  . Sepsis (HCC)   . Vitamin B12 deficiency     Patient Active Problem List   Diagnosis Date Noted  . Chronic osteomyelitis of toe, 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/11/2020  . Anemia   . Acute posthemorrhagic anemia 05/05/2017  . History of recent blood transfusion 05/05/2017  . Hyponatremia 05/05/2017  . Dehydration 05/05/2017  . Acute renal insufficiency 05/05/2017  . Leukocytosis 05/05/2017  . Diarrhea 05/05/2017  . GI bleed 04/28/2017    Past Surgical History:  Procedure Laterality Date  . ABDOMINAL HYSTERECTOMY    . AMPUTATION TOE Left 09/15/2020   Procedure: AMPUTATION TOE MPJ LEFT;  Surgeon: [name], DPM;  Location: [facility] ORS;  Service: Podiatry;  Laterality: Left;  . CHOLECYSTECTOMY    . COLONOSCOPY WITH PROPOFOL N/A 04/30/2017   Procedure: COLONOSCOPY WITH PROPOFOL;   Surgeon: Wyline Mood, MD;  Location: [facility] ENDOSCOPY;  Service: Endoscopy;  Laterality: N/A;  . ENTEROSCOPY N/A 05/09/2017   Procedure: ENTEROSCOPY with deplopyement of capsule for small bowel;  Surgeon: Wyline Mood, MD;  Location: [facility] ENDOSCOPY;  Service: Endoscopy;  Laterality: N/A;  . ESOPHAGOGASTRODUODENOSCOPY (EGD) WITH PROPOFOL N/A 05/03/2017   Procedure: ESOPHAGOGASTRODUODENOSCOPY (EGD) WITH PROPOFOL;  Surgeon: Wyline Mood, MD;  Location: [facility] ENDOSCOPY;  Service: Gastroenterology;  Laterality: N/A;  . GASTRIC BYPASS     x2  . GIVENS CAPSULE STUDY N/A 05/07/2017   Procedure: GIVENS CAPSULE STUDY;  Surgeon: Wyline Mood, MD;  Location: [facility] ENDOSCOPY;  Service: Endoscopy;  Laterality: N/A;  . HERNIA REPAIR    . LUMBAR FUSION      Prior to Admission medications   Medication Sig Start Date End Date Taking? Authorizing Provider  acetaminophen (TYLENOL) 325 MG tablet Take 650 mg by mouth every 6 (six) hours as needed.    [provider]  calcium-vitamin D (OSCAL WITH D) 500-200 MG-UNIT tablet Take 1 tablet by mouth.    [provider]  cetirizine (ZYRTEC) 10 MG tablet Take 10 mg by mouth daily.    [provider]  CREON 24000-76000 units CPEP Take 48,000 Units by mouth 3 (three) times daily before meals.  04/28/17   [provider]  cyanocobalamin (,VITAMIN B-12,) 1000 MCG/ML injection Inject 1 mL into the skin every 14 (fourteen) days.  04/16/17   [provider]  DEPO-ESTRADIOL 5 MG/ML injection Inject 0.4 mg into the muscle every 28 (twenty-eight)  days.  04/22/17   [provider]  ferrous gluconate (FERGON) 324 MG tablet Take 1 tablet (324 mg total) by mouth 3 (three) times daily with meals. Patient taking differently: Take 324 mg by mouth 2 (two) times daily with a meal.  05/05/17   Katharina Caper, MD  fluticasone (FLONASE) 50 MCG/ACT nasal spray Place 1 spray into both nostrils daily as needed for allergies or rhinitis.    [provider]  furosemide (LASIX) 20 MG tablet 20 mg daily.  03/31/17   [provider]  gabapentin (NEURONTIN) 100 MG capsule Take 200 mg by mouth at bedtime.  02/25/17   [provider]  lactase (LACTAID) 3000 units tablet Take by mouth 3 (three) times daily with meals.    [provider]  levothyroxine (SYNTHROID, LEVOTHROID) 25 MCG tablet Take 25 mcg by mouth daily. 04/26/17   [provider]  lisinopril (ZESTRIL) 10 MG tablet Take 10 mg by mouth daily.    [provider]  loperamide (IMODIUM) 2 MG capsule Take by mouth as needed for diarrhea or loose stools.    [provider]  LORazepam (ATIVAN) 1 MG tablet Take 0.5-1 mg by mouth 4 (four) times daily. 0.5 mg 0630,  1 mg 1100, 0.5 mg 1600, 1 mg 2030 04/05/17   [provider]  Melatonin 3 MG CAPS Take by mouth at bedtime.     [provider]  oxybutynin (DITROPAN) 5 MG tablet Take 5 mg by mouth 2 (two) times daily.    [provider]  oxyCODONE-acetaminophen (PERCOCET) 5-325 MG tablet Take 1 tablet by mouth 4 (four) times daily. [phone]    [provider]  oxyCODONE-acetaminophen (PERCOCET) 5-325 MG tablet Take 1 tablet by mouth every 6 (six) hours as needed for severe pain. 09/15/20 09/15/21  [name], DPM  pantoprazole (PROTONIX) 40 MG tablet Take 40 mg by mouth daily.    [provider]  potassium chloride (MICRO-K) 10 MEQ CR capsule 10 mEq 2 (two) times daily.  04/14/17   [provider]  promethazine (PHENERGAN) 25 MG tablet Take 25 mg by mouth every 6 (six) hours as needed. 04/28/17   [provider]  risperidone (RISPERDAL) 4 MG tablet Take 2 mg by mouth 2 (two) times daily.  03/11/17   [provider]  simethicone (MYLICON) 80 MG chewable tablet Chew 80 mg by mouth every 6 (six) hours as needed for flatulence.    [provider]  traZODone (DESYREL) 100 MG tablet Take 50 mg by mouth at bedtime.   04/14/17   [provider]  venlafaxine XR (EFFEXOR-XR) 150 MG 24 hr capsule Take 150 mg by mouth at bedtime.  02/25/17   [provider]    Allergies Other, Penicillin g sodium, Aspirin, Ergotamine tartrate, Ibuprofen, Ketorolac tromethamine, Naproxen, Olanzapine, Oxaprozin, Penicillins, and Seroquel [quetiapine fumarate]  Family History  Problem Relation Age of Onset  . Lung cancer Father   . Hypertension Neg Hx   . Diabetes Neg Hx     Social History Social History   Tobacco Use  . Smoking status: Never Smoker  . Smokeless tobacco: Never Used  Vaping Use  . Vaping Use: Never used  Substance Use Topics  . Alcohol use: No    Comment: rare wine  . Drug use: No    Review of Systems Constitutional: No fever/chills Eyes: No visual changes. ENT: No sore throat. Cardiovascular: Denies chest pain. Respiratory: Denies shortness of breath. Gastrointestinal: No abdominal pain.  Positive for nausea and vomiting.  Genitourinary: Negative for dysuria. Musculoskeletal: Left 2nd toe pain. Skin: Negative for rash. Neurological: Negative for headaches, focal weakness or numbness.  ____________________________________________   PHYSICAL EXAM:  VITAL SIGNS: ED Triage Vitals  Enc Vitals Group     BP 10/13/20 2250 (!) 136/55     Pulse Rate 10/13/20 2250 96     Resp 10/13/20 2250 15     Temp 10/13/20 2250 98.3 F (36.8 C)     Temp Source 10/13/20 2250 Oral     SpO2 10/13/20 2250 96 %     Weight 10/13/20 2243 180 lb (81.6 kg)     Height 10/13/20 2243 5\' 7"  (1.702 m)     Head Circumference --      Peak Flow --      Pain Score 10/13/20 2242 5   Constitutional: Alert and oriented.  Eyes: Conjunctivae are normal.  ENT      Head: Normocephalic and atraumatic.      Nose: No congestion/rhinnorhea.      Mouth/Throat: Mucous membranes are moist.      Neck: No stridor. Hematological/Lymphatic/Immunilogical: No cervical lymphadenopathy. Cardiovascular: Normal rate,  regular rhythm.  No murmurs, rubs, or gallops.  Respiratory: Normal respiratory effort without tachypnea nor retractions. Breath sounds are clear and equal bilaterally. No wheezes/rales/rhonchi. Gastrointestinal: Soft and non tender. No rebound. No guarding.  Genitourinary: Deferred Musculoskeletal: s/p amputation of left 1st digit. Some erythema and tenderness at base of 2nd digit.  Neurologic:  Normal speech and language. No gross focal neurologic deficits are appreciated.  Skin:  Skin is warm, dry and intact. No rash noted. Psychiatric: Mood and affect are normal. Speech and behavior are normal. Patient exhibits appropriate insight and judgment.  ____________________________________________    LABS (pertinent positives/negatives)  UA clear, rare bacteria otherwise unremarkable Lactic acid 1.7 CBC wbc 8.0, hgb 11.1, plt 272 CMP na 132, k 4.0, glu 163, cr 1.58  ____________________________________________   EKG  I, 2243, attending physician, personally viewed and interpreted this EKG  EKG Time: 2246 Rate: 97 Rhythm: sinus rhythm Axis: normal Intervals: qtc 462 QRS: narrow ST changes: no st elevation Impression: normal ekg ____________________________________________    RADIOLOGY  Left foot Likely post surgical change. No evidence of active osteomyelitis  ____________________________________________   PROCEDURES  Procedures  ____________________________________________   INITIAL IMPRESSION / ASSESSMENT AND PLAN / ED COURSE  Pertinent labs & imaging results that were available during my care of the patient were reviewed by me and considered in my medical decision making (see chart for details).   Patient presented to the emergency department today because of concerns for nausea and vomiting.  On exam patient without any abdominal tenderness.  Patient is afebrile.  Work-up without concerning leukocytosis.  Did show slight hyponatremia so patient was  given IV fluids.  Patient was observed here in the emergency department without any vomiting.  Patient was able to tolerate p.o.  This time unclear but do wonder if it was secondary to food ingestion.  Given reassuring work-up and physical exam here will plan on discharging home. ____________________________________________   FINAL CLINICAL IMPRESSION(S) / ED DIAGNOSES  Final diagnoses:  Nausea and vomiting, intractability of vomiting not specified, unspecified vomiting type     Note: This dictation was prepared with Dragon dictation. Any transcriptional errors that result from this process are unintentional     2247, MD 10/14/20 0410

## 2020-10-13 NOTE — ED Triage Notes (Addendum)
Pt here via [facility] from [facility] community. Pt had L great toe amputated x3 weeks ago. Pt has been taking bactrim and levoquin x2 days for cellulitis at amputation site, tonight started having NVD.   Hx of bipolar with recent med change.   EMS VS- cbg 197, RR 22, temp 99.3 axillary, HR 98 NSR, bp 133/74. o2 98% RA.

## 2020-10-14 ENCOUNTER — Emergency Department: Payer: Medicare PPO

## 2020-10-14 LAB — CBC WITH DIFFERENTIAL/PLATELET
Abs Immature Granulocytes: 0.03 10*3/uL (ref 0.00–0.07)
Basophils Absolute: 0 10*3/uL (ref 0.0–0.1)
Basophils Relative: 0 %
Eosinophils Absolute: 0 10*3/uL (ref 0.0–0.5)
Eosinophils Relative: 0 %
HCT: 34.3 % — ABNORMAL LOW (ref 36.0–46.0)
Hemoglobin: 11.1 g/dL — ABNORMAL LOW (ref 12.0–15.0)
Immature Granulocytes: 0 %
Lymphocytes Relative: 10 %
Lymphs Abs: 0.8 10*3/uL (ref 0.7–4.0)
MCH: 30.8 pg (ref 26.0–34.0)
MCHC: 32.4 g/dL (ref 30.0–36.0)
MCV: 95.3 fL (ref 80.0–100.0)
Monocytes Absolute: 0.4 10*3/uL (ref 0.1–1.0)
Monocytes Relative: 5 %
Neutro Abs: 6.8 10*3/uL (ref 1.7–7.7)
Neutrophils Relative %: 85 %
Platelets: 272 10*3/uL (ref 150–400)
RBC: 3.6 MIL/uL — ABNORMAL LOW (ref 3.87–5.11)
RDW: 13.2 % (ref 11.5–15.5)
WBC: 8 10*3/uL (ref 4.0–10.5)
nRBC: 0 % (ref 0.0–0.2)

## 2020-10-14 LAB — COMPREHENSIVE METABOLIC PANEL
ALT: 14 U/L (ref 0–44)
AST: 16 U/L (ref 15–41)
Albumin: 3.2 g/dL — ABNORMAL LOW (ref 3.5–5.0)
Alkaline Phosphatase: 124 U/L (ref 38–126)
Anion gap: 10 (ref 5–15)
BUN: 14 mg/dL (ref 8–23)
CO2: 23 mmol/L (ref 22–32)
Calcium: 8.7 mg/dL — ABNORMAL LOW (ref 8.9–10.3)
Chloride: 99 mmol/L (ref 98–111)
Creatinine, Ser: 1.58 mg/dL — ABNORMAL HIGH (ref 0.44–1.00)
GFR, Estimated: 36 mL/min — ABNORMAL LOW (ref >60)
Glucose, Bld: 163 mg/dL — ABNORMAL HIGH (ref 70–99)
Potassium: 4 mmol/L (ref 3.5–5.1)
Sodium: 132 mmol/L — ABNORMAL LOW (ref 135–145)
Total Bilirubin: 0.5 mg/dL (ref 0.3–1.2)
Total Protein: 6.3 g/dL — ABNORMAL LOW (ref 6.5–8.1)

## 2020-10-14 LAB — URINALYSIS, COMPLETE (UACMP) WITH MICROSCOPIC
Bilirubin Urine: NEGATIVE
Glucose, UA: NEGATIVE mg/dL
Hgb urine dipstick: NEGATIVE
Ketones, ur: NEGATIVE mg/dL
Leukocytes,Ua: NEGATIVE
Nitrite: NEGATIVE
Protein, ur: NEGATIVE mg/dL
Specific Gravity, Urine: 1.008 (ref 1.005–1.030)
pH: 5 (ref 5.0–8.0)

## 2020-10-14 LAB — LACTIC ACID, PLASMA: Lactic Acid, Venous: 1.7 mmol/L (ref 0.5–1.9)

## 2020-10-14 LAB — LIPASE, BLOOD: Lipase: 20 U/L (ref 11–51)

## 2020-10-14 MED ORDER — SODIUM CHLORIDE 0.9 % IV BOLUS
500.0000 mL | Freq: Once | INTRAVENOUS | Status: AC
  Administered 2020-10-14: 500 mL via INTRAVENOUS

## 2020-10-14 MED ORDER — OXYCODONE-ACETAMINOPHEN 5-325 MG PO TABS
1.0000 | ORAL_TABLET | Freq: Once | ORAL | Status: AC
  Administered 2020-10-14: 1 via ORAL
  Filled 2020-10-14: qty 1

## 2020-10-14 NOTE — ED Notes (Signed)
Attempted calling daughter for d/c transport home, no reply.

## 2020-10-14 NOTE — ED Notes (Signed)
Spoke with pt's family. Family on the way to get patient

## 2020-10-14 NOTE — ED Notes (Signed)
Attempted 2 unsuccessful calls to both pt's daughter, and son-in-law. No reply

## 2020-10-14 NOTE — ED Notes (Signed)
Attempted to call daughter at this time to notify of discharge. No answer at this time.

## 2020-10-14 NOTE — Discharge Instructions (Signed)
Please seek medical attention for any high fevers, chest pain, shortness of breath, change in behavior, persistent vomiting, bloody stool or any other new or concerning symptoms.  

## 2020-10-14 NOTE — ED Notes (Signed)
Pt given ginger ale and saltines for PO challenge

## 2020-10-19 LAB — CULTURE, BLOOD (SINGLE)
Culture: NO GROWTH
Special Requests: ADEQUATE

## 2020-11-07 ENCOUNTER — Other Ambulatory Visit: Payer: Self-pay | Admitting: Podiatry

## 2020-11-08 ENCOUNTER — Encounter
Admission: RE | Admit: 2020-11-08 | Discharge: 2020-11-08 | Disposition: A | Discharge status: Home / Self Care | Payer: Medicare PPO | Source: Ambulatory Visit | Attending: Podiatry | Admitting: Podiatry

## 2020-11-08 ENCOUNTER — Other Ambulatory Visit
Admission: RE | Admit: 2020-11-08 | Discharge: 2020-11-08 | Disposition: A | Discharge status: Home / Self Care | Payer: Medicare PPO | Source: Ambulatory Visit | Attending: Podiatry | Admitting: Podiatry

## 2020-11-08 ENCOUNTER — Other Ambulatory Visit: Payer: Self-pay

## 2020-11-08 DIAGNOSIS — Z20822 Contact with and (suspected) exposure to covid-19: Secondary | ICD-10-CM | POA: Diagnosis not present

## 2020-11-08 DIAGNOSIS — Z01812 Encounter for preprocedural laboratory examination: Secondary | ICD-10-CM | POA: Diagnosis present

## 2020-11-08 LAB — SARS CORONAVIRUS 2 (TAT 6-24 HRS): SARS Coronavirus 2: NEGATIVE

## 2020-11-08 NOTE — Patient Instructions (Signed)
Your procedure is scheduled on:  Friday, November 19 Report to the Registration Desk on the 1st floor of the [facility] To find out your arrival time, please call [phone] between 1PM - 3PM on: Thursday, November 18  REMEMBER: Instructions that are not followed completely may result in serious medical risk, up to and including death; or upon the discretion of your surgeon and anesthesiologist your surgery may need to be rescheduled.  Do not eat food after midnight the night before surgery.  No gum chewing, lozengers or hard candies.  You may however, drink CLEAR liquids up to 2 hours before you are scheduled to arrive for your surgery. Do not drink anything within 2 hours of your scheduled arrival time.  Clear liquids include: - water  - apple juice without pulp - gatorade (not RED, PURPLE, OR BLUE) - black coffee or tea (Do NOT add milk or creamers to the coffee or tea) Do NOT drink anything that is not on this list.  TAKE THESE MEDICATIONS THE MORNING OF SURGERY WITH A SIP OF WATER:  1.  Levothyroxine 2.  Oxybutynin 3.  Oxycodone 4.  Pantoprazole - (take one the night before and one on the morning of surgery - helps to prevent nausea after surgery.) 5.  Risperidone  One week prior to surgery: Stop Anti-inflammatories (NSAIDS) such as Advil, Aleve, Ibuprofen, Motrin, Naproxen, Naprosyn and Aspirin based products such as Excedrin, Goodys Powder, BC Powder. Stop ANY OVER THE COUNTER supplements until after surgery. (However, you may continue taking Vitamin D, Vitamin B, and multivitamin up until the day before surgery.)  No Alcohol for 24 hours before or after surgery.  On the morning of surgery brush your teeth with toothpaste and water, you may rinse your mouth with mouthwash if you wish. Do not swallow any toothpaste or mouthwash.  Do not wear jewelry, make-up, hairpins, clips or nail polish.  Do not wear lotions, powders, or perfumes.   Do not shave body from the  neck down 48 hours prior to surgery just in case you cut yourself which could leave a site for infection.   Contact lenses, hearing aids and dentures may not be worn into surgery.  Do not bring valuables to the hospital. [facility] is not responsible for any missing/lost belongings or valuables.   Shower using antibacterial or CHG Soap prior to surgery.  Notify your doctor if there is any change in your medical condition (cold, fever, infection).  Wear comfortable clothing (specific to your surgery type) to the hospital.  Plan for stool softeners for home use; pain medications have a tendency to cause constipation. You can also help prevent constipation by eating foods high in fiber such as fruits and vegetables and drinking plenty of fluids as your diet allows.  After surgery, you can help prevent lung complications by doing breathing exercises.  Take deep breaths and cough every 1-2 hours. Your doctor may order a device called an Incentive Spirometer to help you take deep breaths.  If you are being discharged the day of surgery, you will not be allowed to drive home. You will need a responsible adult (18 years or older) to drive you home and stay with you that night.   If you are taking public transportation, you will need to have a responsible adult (18 years or older) with you. Please confirm with your physician that it is acceptable to use public transportation.   Please call the Pre-admissions Testing Dept. at [phone] if you  have any questions about these instructions.  Visitation Policy:  Patients undergoing a surgery or procedure may have one family member or support person with them as long as that person is not COVID-19 positive or experiencing its symptoms.  That person may remain in the waiting area during the procedure.

## 2020-11-09 MED ORDER — CHLORHEXIDINE GLUCONATE 0.12 % MT SOLN
15.0000 mL | Freq: Once | OROMUCOSAL | Status: AC
  Administered 2020-11-10: 15 mL via OROMUCOSAL

## 2020-11-09 MED ORDER — VANCOMYCIN HCL IN DEXTROSE 1-5 GM/200ML-% IV SOLN: 1000.0000 mg | INTRAVENOUS | Status: AC

## 2020-11-09 MED ORDER — ORAL CARE MOUTH RINSE: 15.0000 mL | Freq: Once | OROMUCOSAL | Status: AC

## 2020-11-09 MED ORDER — LACTATED RINGERS IV SOLN: INTRAVENOUS | Status: DC

## 2020-11-09 MED ORDER — POVIDONE-IODINE 10 % EX SWAB
2.0000 "application " | Freq: Once | CUTANEOUS | Status: AC
  Administered 2020-11-10: 2 via TOPICAL

## 2020-11-10 ENCOUNTER — Ambulatory Visit: Payer: Medicare PPO | Admitting: Certified Registered Nurse Anesthetist

## 2020-11-10 ENCOUNTER — Ambulatory Visit
Admission: RE | Admit: 2020-11-10 | Discharge: 2020-11-10 | Disposition: A | Discharge status: Home / Self Care | Payer: Medicare PPO | Attending: Podiatry | Admitting: Podiatry

## 2020-11-10 ENCOUNTER — Encounter
Admission: RE | Disposition: A | Discharge status: Home / Self Care | Payer: Self-pay | Source: Home / Self Care | Attending: Podiatry

## 2020-11-10 ENCOUNTER — Encounter: Payer: Self-pay | Admitting: Podiatry

## 2020-11-10 ENCOUNTER — Other Ambulatory Visit: Payer: Self-pay

## 2020-11-10 DIAGNOSIS — Z885 Allergy status to narcotic agent status: Secondary | ICD-10-CM | POA: Insufficient documentation

## 2020-11-10 DIAGNOSIS — Z886 Allergy status to analgesic agent status: Secondary | ICD-10-CM | POA: Diagnosis not present

## 2020-11-10 DIAGNOSIS — L97529 Non-pressure chronic ulcer of other part of left foot with unspecified severity: Secondary | ICD-10-CM | POA: Insufficient documentation

## 2020-11-10 DIAGNOSIS — M86172 Other acute osteomyelitis, left ankle and foot: Secondary | ICD-10-CM | POA: Diagnosis not present

## 2020-11-10 DIAGNOSIS — M868X7 Other osteomyelitis, ankle and foot: Secondary | ICD-10-CM | POA: Insufficient documentation

## 2020-11-10 DIAGNOSIS — Z888 Allergy status to other drugs, medicaments and biological substances status: Secondary | ICD-10-CM | POA: Diagnosis not present

## 2020-11-10 DIAGNOSIS — Z880 Allergy status to penicillin: Secondary | ICD-10-CM | POA: Diagnosis not present

## 2020-11-10 DIAGNOSIS — Z79890 Hormone replacement therapy (postmenopausal): Secondary | ICD-10-CM | POA: Diagnosis not present

## 2020-11-10 DIAGNOSIS — Z79899 Other long term (current) drug therapy: Secondary | ICD-10-CM | POA: Diagnosis not present

## 2020-11-10 DIAGNOSIS — M869 Osteomyelitis, unspecified: Secondary | ICD-10-CM | POA: Diagnosis present

## 2020-11-10 HISTORY — PX: AMPUTATION TOE: SHX6595

## 2020-11-10 SURGERY — AMPUTATION, TOE
Anesthesia: General | Site: Toe | Laterality: Left

## 2020-11-10 MED ORDER — BUPIVACAINE HCL 0.5 % IJ SOLN
INTRAMUSCULAR | Status: DC | PRN
  Administered 2020-11-10: 4 mL

## 2020-11-10 MED ORDER — ONDANSETRON HCL 4 MG/2ML IJ SOLN
INTRAMUSCULAR | Status: AC
  Filled 2020-11-10: qty 2

## 2020-11-10 MED ORDER — LIDOCAINE HCL (PF) 1 % IJ SOLN
INTRAMUSCULAR | Status: DC | PRN
  Administered 2020-11-10: 4 mL

## 2020-11-10 MED ORDER — OXYCODONE HCL 5 MG/5ML PO SOLN: 5.0000 mg | Freq: Once | ORAL | Status: DC | PRN

## 2020-11-10 MED ORDER — PROPOFOL 10 MG/ML IV BOLUS
INTRAVENOUS | Status: AC
  Filled 2020-11-10: qty 20

## 2020-11-10 MED ORDER — ONDANSETRON HCL 4 MG/2ML IJ SOLN: 4.0000 mg | Freq: Four times a day (QID) | INTRAMUSCULAR | Status: DC | PRN

## 2020-11-10 MED ORDER — VANCOMYCIN HCL IN DEXTROSE 1-5 GM/200ML-% IV SOLN
INTRAVENOUS | Status: AC
  Administered 2020-11-10: 1000 mg via INTRAVENOUS
  Filled 2020-11-10: qty 200

## 2020-11-10 MED ORDER — FENTANYL CITRATE (PF) 100 MCG/2ML IJ SOLN
INTRAMUSCULAR | Status: AC
  Filled 2020-11-10: qty 2

## 2020-11-10 MED ORDER — METOCLOPRAMIDE HCL 10 MG PO TABS: 5.0000 mg | ORAL_TABLET | Freq: Three times a day (TID) | ORAL | Status: DC | PRN

## 2020-11-10 MED ORDER — ONDANSETRON HCL 4 MG/2ML IJ SOLN
INTRAMUSCULAR | Status: DC | PRN
  Administered 2020-11-10: 4 mg via INTRAVENOUS

## 2020-11-10 MED ORDER — FENTANYL CITRATE (PF) 100 MCG/2ML IJ SOLN
INTRAMUSCULAR | Status: DC | PRN
  Administered 2020-11-10: 12.5 ug via INTRAVENOUS

## 2020-11-10 MED ORDER — METOCLOPRAMIDE HCL 5 MG/ML IJ SOLN: 5.0000 mg | Freq: Three times a day (TID) | INTRAMUSCULAR | Status: DC | PRN

## 2020-11-10 MED ORDER — OXYCODONE HCL 5 MG PO TABS: 5.0000 mg | ORAL_TABLET | Freq: Once | ORAL | Status: DC | PRN

## 2020-11-10 MED ORDER — FENTANYL CITRATE (PF) 100 MCG/2ML IJ SOLN
25.0000 ug | INTRAMUSCULAR | Status: DC | PRN
  Administered 2020-11-10: 50 ug via INTRAVENOUS

## 2020-11-10 MED ORDER — DEXAMETHASONE SODIUM PHOSPHATE 10 MG/ML IJ SOLN
INTRAMUSCULAR | Status: AC
  Filled 2020-11-10: qty 1

## 2020-11-10 MED ORDER — MIDAZOLAM HCL 2 MG/2ML IJ SOLN
INTRAMUSCULAR | Status: AC
  Filled 2020-11-10: qty 2

## 2020-11-10 MED ORDER — CHLORHEXIDINE GLUCONATE 0.12 % MT SOLN
OROMUCOSAL | Status: AC
  Filled 2020-11-10: qty 15

## 2020-11-10 MED ORDER — PROPOFOL 500 MG/50ML IV EMUL
INTRAVENOUS | Status: DC | PRN
  Administered 2020-11-10: 150 ug/kg/min via INTRAVENOUS

## 2020-11-10 MED ORDER — DEXAMETHASONE SODIUM PHOSPHATE 10 MG/ML IJ SOLN
INTRAMUSCULAR | Status: DC | PRN
  Administered 2020-11-10: 10 mg via INTRAVENOUS

## 2020-11-10 MED ORDER — MIDAZOLAM HCL 2 MG/2ML IJ SOLN
INTRAMUSCULAR | Status: DC | PRN
  Administered 2020-11-10: 2 mg via INTRAVENOUS

## 2020-11-10 MED ORDER — ONDANSETRON HCL 4 MG PO TABS: 4.0000 mg | ORAL_TABLET | Freq: Four times a day (QID) | ORAL | Status: DC | PRN

## 2020-11-10 MED ORDER — LIDOCAINE HCL (PF) 2 % IJ SOLN
INTRAMUSCULAR | Status: AC
  Filled 2020-11-10: qty 5

## 2020-11-10 SURGICAL SUPPLY — 46 items
BLADE OSC/SAGITTAL MD 5.5X18 (BLADE) ×2 IMPLANT
BLADE SURG MINI STRL (BLADE) ×2 IMPLANT
BNDG CMPR STD VLCR NS LF 5.8X4 (GAUZE/BANDAGES/DRESSINGS) ×1
BNDG CONFORM 2 STRL LF (GAUZE/BANDAGES/DRESSINGS) ×2 IMPLANT
BNDG CONFORM 3 STRL LF (GAUZE/BANDAGES/DRESSINGS) ×4 IMPLANT
BNDG ELASTIC 4X5.8 VLCR NS LF (GAUZE/BANDAGES/DRESSINGS) ×2 IMPLANT
BNDG ESMARK 4X12 TAN STRL LF (GAUZE/BANDAGES/DRESSINGS) ×2 IMPLANT
BNDG GAUZE 4.5X4.1 6PLY STRL (MISCELLANEOUS) ×2 IMPLANT
CANISTER SUCT 1200ML W/VALVE (MISCELLANEOUS) ×2 IMPLANT
COVER WAND RF STERILE (DRAPES) ×2 IMPLANT
CUFF TOURN SGL QUICK 12 (TOURNIQUET CUFF) IMPLANT
CUFF TOURN SGL QUICK 18X4 (TOURNIQUET CUFF) IMPLANT
DRAPE FLUOR MINI C-ARM 54X84 (DRAPES) ×2 IMPLANT
DRAPE XRAY CASSETTE 23X24 (DRAPES) ×2 IMPLANT
DURAPREP 26ML APPLICATOR (WOUND CARE) ×2 IMPLANT
ELECT REM PT RETURN 9FT ADLT (ELECTROSURGICAL) ×2
ELECTRODE REM PT RTRN 9FT ADLT (ELECTROSURGICAL) ×1 IMPLANT
GAUZE PACKING IODOFORM 1/2 (PACKING) ×2 IMPLANT
GAUZE SPONGE 4X4 12PLY STRL (GAUZE/BANDAGES/DRESSINGS) ×2 IMPLANT
GAUZE XEROFORM 1X8 LF (GAUZE/BANDAGES/DRESSINGS) ×2 IMPLANT
GLOVE BIO SURGEON STRL SZ7.5 (GLOVE) ×2 IMPLANT
GLOVE INDICATOR 8.0 STRL GRN (GLOVE) ×2 IMPLANT
GOWN STRL REUS W/ TWL XL LVL3 (GOWN DISPOSABLE) ×2 IMPLANT
GOWN STRL REUS W/TWL XL LVL3 (GOWN DISPOSABLE) ×4
KIT TURNOVER KIT A (KITS) ×2 IMPLANT
LABEL OR SOLS (LABEL) ×2 IMPLANT
MANIFOLD NEPTUNE II (INSTRUMENTS) ×2 IMPLANT
NEEDLE FILTER BLUNT 18X 1/2SAF (NEEDLE) ×1
NEEDLE FILTER BLUNT 18X1 1/2 (NEEDLE) ×1 IMPLANT
NEEDLE HYPO 25X1 1.5 SAFETY (NEEDLE) ×2 IMPLANT
NS IRRIG 500ML POUR BTL (IV SOLUTION) ×2 IMPLANT
PACK EXTREMITY (MISCELLANEOUS) ×2 IMPLANT
PAD ABD DERMACEA PRESS 5X9 (GAUZE/BANDAGES/DRESSINGS) ×4 IMPLANT
PULSAVAC PLUS IRRIG FAN TIP (DISPOSABLE) ×2
SHIELD FULL FACE ANTIFOG 7M (MISCELLANEOUS) ×2 IMPLANT
SOL .9 NS 3000ML IRR  AL (IV SOLUTION) ×1
SOL .9 NS 3000ML IRR AL (IV SOLUTION) ×1
SOL .9 NS 3000ML IRR UROMATIC (IV SOLUTION) ×1 IMPLANT
STOCKINETTE M/LG 89821 (MISCELLANEOUS) ×2 IMPLANT
STRAP SAFETY 5IN WIDE (MISCELLANEOUS) ×2 IMPLANT
SUT ETHILON 3-0 FS-10 30 BLK (SUTURE) ×2
SUT ETHILON 5-0 FS-2 18 BLK (SUTURE) ×2 IMPLANT
SUT VIC AB 4-0 FS2 27 (SUTURE) ×2 IMPLANT
SUTURE EHLN 3-0 FS-10 30 BLK (SUTURE) ×1 IMPLANT
SYR 10ML LL (SYRINGE) ×6 IMPLANT
TIP FAN IRRIG PULSAVAC PLUS (DISPOSABLE) ×1 IMPLANT

## 2020-11-10 NOTE — Anesthesia Procedure Notes (Signed)
Procedure Name: MAC Date/Time: 11/10/2020 11:57 AM Performed by: Lily Peer, Emery Binz, CRNA Pre-anesthesia Checklist: Patient identified, Emergency Drugs available, Suction available and Patient being monitored Patient Re-evaluated:Patient Re-evaluated prior to induction Oxygen Delivery Method: Simple face mask Induction Type: IV induction

## 2020-11-10 NOTE — Discharge Instructions (Signed)
[facility]  POST OPERATIVE INSTRUCTIONS FOR DR. TROXLER, DR. FOWLER, AND DR. [facility] PODIATRY DEPARTMENT   1. Take your medication as prescribed.  Pain medication should be taken only as needed.  2. Keep the dressing clean, dry and intact.  3. Keep your foot elevated above the heart level for the first 48 hours.  4. Walking to the bathroom and brief periods of walking are acceptable, unless we have instructed you to be non-weight bearing.  5. Always wear your post-op shoe when walking.  Always use your crutches if you are to be non-weight bearing.  6. Do not take a shower. Baths are permissible as long as the foot is kept out of the water.   7. Every hour you are awake:  - Bend your knee 15 times. - Flex foot 15 times - Massage calf 15 times  8. Call [facility] ([phone]) if any of the following problems occur: - You develop a temperature or fever. - The bandage becomes saturated with blood. - Medication does not stop your pain. - Injury of the foot occurs. - Any symptoms of infection including redness, odor, or red streaks running from wound.  AMBULATORY SURGERY  DISCHARGE INSTRUCTIONS  1) The drugs that you were given will stay in your system until tomorrow so for the next 24 hours you should not: A) Drive an automobile B) Make any legal decisions C) Drink any alcoholic beverage  2) You may resume regular meals tomorrow.  Today it is better to start with liquids and gradually work up to solid foods. You may eat anything you prefer, but it is better to start with liquids, then soup and crackers, and gradually work up to solid foods.  3) Please notify your doctor immediately if you have any unusual bleeding, trouble breathing, redness and pain at the surgery site, drainage, fever, or pain not relieved by medication.  4) Additional Instructions:   Please contact your physician with any problems or Same  Day Surgery at [phone], Monday through Friday 6 am to 4 pm, or [place] [facility] [place] Main number at [phone]. 

## 2020-11-10 NOTE — H&P (Signed)
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  The various methods of treatment have been discussed with the patient.  No guarantees were given.  After consideration of risks, benefits and other options for treatment, the patient has consented to surgery.  I have reviewed the patients' chart and labs.     A history and physical examination was performed in my office.  The patient was reexamined.  There have been no changes to this history and physical examination.  [name] A

## 2020-11-10 NOTE — Anesthesia Postprocedure Evaluation (Signed)
Anesthesia Post Note  Patient: Jacqueline Williams  Procedure(s) Performed: AMPUTATION TOE IPJ LEFT 2ND (Left Toe)  Patient location during evaluation: PACU Anesthesia Type: General Level of consciousness: awake and alert Pain management: pain level controlled Vital Signs Assessment: post-procedure vital signs reviewed and stable Respiratory status: spontaneous breathing, nonlabored ventilation, respiratory function stable and patient connected to nasal cannula oxygen Cardiovascular status: blood pressure returned to baseline and stable Postop Assessment: no apparent nausea or vomiting Anesthetic complications: no   No complications documented.   Last Vitals:  Vitals:   11/10/20 1306 11/10/20 1326  BP: (!) 145/72 138/64  Pulse: 74 75  Resp: 18   Temp: 36.4 C   SpO2: 99% 99%    Last Pain:  Vitals:   11/10/20 1326  TempSrc:   PainSc: 0-No pain                 Cleda Mccreedy Ritisha Deitrick

## 2020-11-10 NOTE — Anesthesia Preprocedure Evaluation (Addendum)
Anesthesia Evaluation  Patient identified by MRN, date of birth, ID band Patient awake    Reviewed: Allergy & Precautions, NPO status , Patient's Chart, lab work & pertinent test results  History of Anesthesia Complications Negative for: history of anesthetic complications  Airway Mallampati: II  TM Distance: >3 FB Neck ROM: Full    Dental  (+) Edentulous Upper, Edentulous Lower, Dental Advisory Given   Pulmonary neg pulmonary ROS, neg sleep apnea, neg COPD, Patient abstained from smoking.Not current smoker,    Pulmonary exam normal        Cardiovascular Exercise Tolerance: Good METShypertension, (-) CAD and (-) Past MI Normal cardiovascular exam(-) dysrhythmias  - Systolic murmurs    Neuro/Psych PSYCHIATRIC DISORDERS Anxiety Depression Bipolar Disorder negative neurological ROS  negative psych ROS   GI/Hepatic neg GERD  ,(+)     (-) substance abuse  ,   Endo/Other  neg diabetesHypothyroidism   Renal/GU CRFRenal diseasenegative Renal ROS     Musculoskeletal   Abdominal   Peds  Hematology   Anesthesia Other Findings Past Medical History: No date: Anxiety No date: Back pain No date: Bipolar disorder (HCC) No date: Depression No date: Hypertension No date: Hypothyroidism No date: Neuropathy 2021: Osteomyelitis of great toe of left foot (HCC) No date: Sepsis (HCC) No date: Vitamin B12 deficiency  Reproductive/Obstetrics                             Anesthesia Physical  Anesthesia Plan  ASA: III  Anesthesia Plan: General   Post-op Pain Management:    Induction: Intravenous  PONV Risk Score and Plan: Propofol infusion and TIVA  Airway Management Planned: Natural Airway and Nasal Cannula  Additional Equipment:   Intra-op Plan:   Post-operative Plan:   Informed Consent: I have reviewed the patients History and Physical, chart, labs and discussed the procedure including the  risks, benefits and alternatives for the proposed anesthesia with the patient or authorized representative who has indicated his/her understanding and acceptance.     Dental Advisory Given  Plan Discussed with: Anesthesiologist, CRNA and Surgeon  Anesthesia Plan Comments: (Phone consent from the patients daughter Hollace Kinnier at [phone]   Patient and daughter consented for risks of anesthesia including but not limited to:  - adverse reactions to medications - risk of airway placement if required - damage to eyes, teeth, lips or other oral mucosa - nerve damage due to positioning  - sore throat or hoarseness - Damage to heart, brain, nerves, lungs, other parts of body or loss of life  They voiced understanding.)     Anesthesia Quick Evaluation

## 2020-11-10 NOTE — Op Note (Signed)
Operative note   Surgeon:Ashley Montminy Armed forces logistics/support/administrative officer: None    Preop diagnosis: Osteomyelitis distal left second toe    Postop diagnosis: Same    Procedure: Amputation PIPJ left second toe    EBL: Minimal    Anesthesia:local and IV sedation.  Local consisted of 0.5% bupivacaine and 2% lidocaine plain.    Hemostasis: None    Specimen: Distal left second toe for pathology and deep wound culture    Complications: None    Operative indications:Jacqueline Williams is an 69 y.o. that presents today for surgical intervention.  The risks/benefits/alternatives/complications have been discussed and consent has been given.    Procedure:  Patient was brought into the OR and placed on the operating table in thesupine position. After anesthesia was obtained theleft lower extremity was prepped and draped in usual sterile fashion.  Attention was directed to the distal aspect of the left second toe where the ulcerative site was noted and noted edema.  Preoperatively there was erosive changes on the distal phalanx of the second toe.  A fishmouth type of incision was made overlying the PIPJ.  The middle and distal phalanx was disarticulated and sent for pathological examination.  This was removed from the surgical field in toto.  The wound was then flushed with copious amounts of irrigation.  No further signs of infection were noted.  The proximal wound site was cultured.  Next closure was then performed with a 3-0 nylon.  Bulky sterile dressing was then applied.    Patient tolerated the procedure and anesthesia well.  Was transported from the OR to the PACU with all vital signs stable and vascular status intact. To be discharged per routine protocol.  Will follow up in approximately 1 week in the outpatient clinic.

## 2020-11-10 NOTE — Transfer of Care (Signed)
Immediate Anesthesia Transfer of Care Note  Patient: Jacqueline Williams  Procedure(s) Performed: AMPUTATION TOE IPJ LEFT 2ND (Left Toe)  Patient Location: PACU  Anesthesia Type:General  Level of Consciousness: sedated  Airway & Oxygen Therapy: Patient Spontanous Breathing and Patient connected to face mask oxygen  Post-op Assessment: Report given to RN and Post -op Vital signs reviewed and stable  Post vital signs: Reviewed and stable  Last Vitals:  Vitals Value Taken Time  BP 119/63 11/10/20 1216  Temp 36.1 C 11/10/20 1215  Pulse 64 11/10/20 1220  Resp 16 11/10/20 1220  SpO2 100 % 11/10/20 1220  Vitals shown include unvalidated device data.  Last Pain:  Vitals:   11/10/20 1215  TempSrc:   PainSc: Asleep         Complications: No complications documented.

## 2020-11-13 LAB — AEROBIC CULTURE W GRAM STAIN (SUPERFICIAL SPECIMEN)
Culture: NO GROWTH
Gram Stain: NONE SEEN

## 2020-11-14 LAB — SURGICAL PATHOLOGY

## 2020-11-18 ENCOUNTER — Other Ambulatory Visit: Payer: Self-pay

## 2020-11-18 ENCOUNTER — Emergency Department
Admission: EM | Admit: 2020-11-18 | Discharge: 2020-11-18 | Disposition: A | Discharge status: Home / Self Care | Payer: Medicare PPO | Attending: Emergency Medicine | Admitting: Emergency Medicine

## 2020-11-18 ENCOUNTER — Emergency Department: Payer: Medicare PPO

## 2020-11-18 ENCOUNTER — Encounter: Payer: Self-pay | Admitting: Emergency Medicine

## 2020-11-18 DIAGNOSIS — E039 Hypothyroidism, unspecified: Secondary | ICD-10-CM | POA: Diagnosis not present

## 2020-11-18 DIAGNOSIS — Z79899 Other long term (current) drug therapy: Secondary | ICD-10-CM | POA: Diagnosis not present

## 2020-11-18 DIAGNOSIS — R609 Edema, unspecified: Secondary | ICD-10-CM

## 2020-11-18 DIAGNOSIS — I10 Essential (primary) hypertension: Secondary | ICD-10-CM | POA: Diagnosis not present

## 2020-11-18 DIAGNOSIS — M79605 Pain in left leg: Secondary | ICD-10-CM | POA: Diagnosis not present

## 2020-11-18 DIAGNOSIS — R600 Localized edema: Secondary | ICD-10-CM

## 2020-11-18 NOTE — ED Provider Notes (Signed)
[facility] Emergency Department Provider Note  ____________________________________________  Time seen: Approximately 2:46 PM  I have reviewed the triage vital signs and the nursing notes.   HISTORY  Chief Complaint Leg Pain    HPI Jacqueline Williams is a 69 y.o. female with a history of bipolar disorder hypertension osteomyelitis of the left great toe amputation of the left great toe about a month ago who complains of left leg pain that started yesterday, gradual onset, worse in the upper lateral thigh and radiating down the leg.  No aggravating or alleviating factors.  Moderate intensity.  No fevers chest pain shortness of breath.  No history of DVT, no blood thinner use.   Patient was seen by podiatry in follow-up 3 days ago, had wound redressed.   Past Medical History:  Diagnosis Date  . Anxiety   . Back pain   . Bipolar disorder (HCC)   . Depression   . Hypertension   . Hypothyroidism   . Neuropathy   . Osteomyelitis of great toe of left foot (HCC) 2021  . Sepsis (HCC)   . Vitamin B12 deficiency      Patient Active Problem List   Diagnosis Date Noted  . Chronic osteomyelitis of toe, 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/11/2020  . Anemia   . Acute posthemorrhagic anemia 05/05/2017  . History of recent blood transfusion 05/05/2017  . Hyponatremia 05/05/2017  . Dehydration 05/05/2017  . Acute renal insufficiency 05/05/2017  . Leukocytosis 05/05/2017  . Diarrhea 05/05/2017  . GI bleed 04/28/2017     Past Surgical History:  Procedure Laterality Date  . ABDOMINAL HYSTERECTOMY    . AMPUTATION TOE Left 09/15/2020   Procedure: AMPUTATION TOE MPJ LEFT;  Surgeon: [name], DPM;  Location: [facility] ORS;  Service: Podiatry;  Laterality: Left;  . AMPUTATION TOE Left 11/10/2020   Procedure: AMPUTATION TOE IPJ LEFT 2ND;  Surgeon: [name], DPM;  Location: [facility] ORS;  Service: Podiatry;  Laterality: Left;  . CHOLECYSTECTOMY    . COLONOSCOPY WITH PROPOFOL N/A 04/30/2017    Procedure: COLONOSCOPY WITH PROPOFOL;  Surgeon: Wyline Mood, MD;  Location: [facility] ENDOSCOPY;  Service: Endoscopy;  Laterality: N/A;  . ENTEROSCOPY N/A 05/09/2017   Procedure: ENTEROSCOPY with deplopyement of capsule for small bowel;  Surgeon: Wyline Mood, MD;  Location: [facility] ENDOSCOPY;  Service: Endoscopy;  Laterality: N/A;  . ESOPHAGOGASTRODUODENOSCOPY (EGD) WITH PROPOFOL N/A 05/03/2017   Procedure: ESOPHAGOGASTRODUODENOSCOPY (EGD) WITH PROPOFOL;  Surgeon: Wyline Mood, MD;  Location: [facility] ENDOSCOPY;  Service: Gastroenterology;  Laterality: N/A;  . GASTRIC BYPASS     x2  . GIVENS CAPSULE STUDY N/A 05/07/2017   Procedure: GIVENS CAPSULE STUDY;  Surgeon: Wyline Mood, MD;  Location: [facility] ENDOSCOPY;  Service: Endoscopy;  Laterality: N/A;  . HERNIA REPAIR    . LUMBAR FUSION       Prior to Admission medications   Medication Sig Start Date End Date Taking? Authorizing Provider  acetaminophen (TYLENOL) 325 MG tablet Take 650 mg by mouth every 6 (six) hours as needed.    [provider]  calcium-vitamin D (OSCAL WITH D) 500-200 MG-UNIT tablet Take 1 tablet by mouth.    [provider]  cetirizine (ZYRTEC) 10 MG tablet Take 10 mg by mouth daily.    [provider]  CREON 24000-76000 units CPEP Take 48,000 Units by mouth 3 (three) times daily before meals.  04/28/17   [provider]  cyanocobalamin (,VITAMIN B-12,) 1000 MCG/ML injection Inject 1 mL into the skin every 14 (fourteen) days.  04/16/17   [provider]  DEPO-ESTRADIOL 5 MG/ML injection Inject 0.4 mg into the muscle every 28 (twenty-eight) days.  04/22/17   [provider]  ferrous gluconate (FERGON) 324 MG tablet Take 1 tablet (324 mg total) by mouth 3 (three) times daily with meals. Patient taking differently: Take 324 mg by mouth 2 (two) times daily with a meal.  05/05/17   Katharina CaperVaickute, Rima, MD  fluticasone (FLONASE) 50 MCG/ACT nasal spray Place 1 spray into both nostrils daily as needed for  allergies or rhinitis.    [provider]  furosemide (LASIX) 20 MG tablet 20 mg daily.  03/31/17   [provider]  gabapentin (NEURONTIN) 100 MG capsule Take 200 mg by mouth at bedtime.  02/25/17   [provider]  lactase (LACTAID) 3000 units tablet Take by mouth 3 (three) times daily with meals.    [provider]  levothyroxine (SYNTHROID, LEVOTHROID) 25 MCG tablet Take 25 mcg by mouth daily. 04/26/17   [provider]  lisinopril (ZESTRIL) 10 MG tablet Take 10 mg by mouth daily.    [provider]  loperamide (IMODIUM) 2 MG capsule Take by mouth as needed for diarrhea or loose stools.    [provider]  LORazepam (ATIVAN) 1 MG tablet Take 0.5-1 mg by mouth 4 (four) times daily. 0.5 mg 0630,  1 mg 1100, 0.5 mg 1600, 1 mg 2030 04/05/17   [provider]  Melatonin 3 MG CAPS Take by mouth at bedtime.     [provider]  oxybutynin (DITROPAN) 5 MG tablet Take 5 mg by mouth 2 (two) times daily.    [provider]  oxyCODONE-acetaminophen (PERCOCET) 5-325 MG tablet Take 1 tablet by mouth 4 (four) times daily. [id number],1100,1600,2030    [provider]  oxyCODONE-acetaminophen (PERCOCET) 5-325 MG tablet Take 1 tablet by mouth every 6 (six) hours as needed for severe pain. 09/15/20 09/15/21  Gwyneth RevelsFowler, Justin, DPM  pantoprazole (PROTONIX) 40 MG tablet Take 40 mg by mouth daily.    [provider]  potassium chloride (MICRO-K) 10 MEQ CR capsule 10 mEq 2 (two) times daily.  04/14/17   [provider]  promethazine (PHENERGAN) 25 MG tablet Take 25 mg by mouth every 6 (six) hours as needed. 04/28/17   [provider]  risperidone (RISPERDAL) 4 MG tablet Take 2 mg by mouth 2 (two) times daily.  03/11/17   [provider]  simethicone (MYLICON) 80 MG chewable tablet Chew 80 mg by mouth every 6 (six) hours as needed for flatulence.    [provider]  traZODone (DESYREL) 100 MG  tablet Take 50 mg by mouth at bedtime.  04/14/17   [provider]  venlafaxine XR (EFFEXOR-XR) 150 MG 24 hr capsule Take 150 mg by mouth at bedtime.  02/25/17   [provider]     Allergies Other, Penicillin g sodium, Aspirin, Ergotamine tartrate, Ibuprofen, Ketorolac tromethamine, Naproxen, Olanzapine, Oxaprozin, Penicillins, and Seroquel [quetiapine fumarate]   Family History  Problem Relation Age of Onset  . Lung cancer Father   . Hypertension Neg Hx   . Diabetes Neg Hx     Social History Social History   Tobacco Use  . Smoking status: Never Smoker  . Smokeless tobacco: Never Used  Vaping Use  . Vaping Use: Never used  Substance Use Topics  . Alcohol use: No    Comment: rare wine  . Drug use: No    Review of Systems  Constitutional:  No fever or chills.  ENT:   No sore throat. No rhinorrhea. Cardiovascular:   No chest pain or syncope. Respiratory:   No dyspnea or cough. Gastrointestinal:   Negative for abdominal pain, vomiting and diarrhea.  Musculoskeletal:   Left leg pain as above All other systems reviewed and are negative except as documented above in ROS and HPI.  ____________________________________________   PHYSICAL EXAM:  VITAL SIGNS: ED Triage Vitals  Enc Vitals Group     BP 11/18/20 1206 121/72     Pulse Rate 11/18/20 1206 78     Resp 11/18/20 1206 20     Temp 11/18/20 1206 97.9 F (36.6 C)     Temp Source 11/18/20 1206 Oral     SpO2 11/18/20 1206 99 %     Weight 11/18/20 1114 220 lb (99.8 kg)     Height 11/18/20 1114 5\' 7"  (1.702 m)     Head Circumference --      Peak Flow --      Pain Score 11/18/20 1114 10     Pain Loc --      Pain Edu? --      Excl. in GC? --     Vital signs reviewed, nursing assessments reviewed.   Constitutional:   Alert and oriented. Non-toxic appearance. Eyes:   Conjunctivae are normal. EOMI. PERRL. ENT      Head:   Normocephalic and atraumatic.      Nose:   Wearing a mask.       Mouth/Throat:   Wearing a mask.      Neck:   No meningismus. Full ROM. Hematological/Lymphatic/Immunilogical:   No cervical lymphadenopathy. Cardiovascular:   RRR. Symmetric  DP pulses.  No murmurs. Cap refill less than 2 seconds. Respiratory:   Normal respiratory effort without tachypnea/retractions. Gastrointestinal:   Soft and nontender. Non distended. There is no CVA tenderness.  No rebound, rigidity, or guarding.  No hernia  Musculoskeletal:   Normal range of motion in all extremities. No joint effusions.  No lower extremity tenderness.  2+ pitting edema bilateral lower extremities.  Symmetric calf circumference.  No inflammatory soft tissue changes.  Surgical site of the left distal foot noninflamed, nontender, no swelling or drainage.  Wounds healing well. Neurologic:   Normal speech and language.  Motor grossly intact. No acute focal neurologic deficits are appreciated.  Skin:    Skin is warm, dry and intact. No rash noted.  No petechiae, purpura, or bullae.  ____________________________________________    LABS (pertinent positives/negatives) (all labs ordered are listed, but only abnormal results are displayed) Labs Reviewed - No data to display ____________________________________________   EKG    ____________________________________________    RADIOLOGY  11/20/20 Venous Img Lower Unilateral Left  Result Date: 11/18/2020 CLINICAL DATA:  69 year old female left leg pain EXAM: LEFT LOWER EXTREMITY VENOUS DOPPLER ULTRASOUND TECHNIQUE: Gray-scale sonography with graded compression, as well as color Doppler and duplex ultrasound were performed to evaluate the left lower extremity deep venous systems from the level of the common femoral vein and including the common femoral, femoral, profunda femoral, popliteal and calf veins including the posterior tibial, peroneal and gastrocnemius veins when visible. Spectral Doppler was utilized to evaluate flow at rest and with distal  augmentation maneuvers in the common femoral, femoral and popliteal veins. The contralateral common femoral vein was also evaluated for comparison. COMPARISON:  None. FINDINGS: LEFT LOWER EXTREMITY Common Femoral Vein: No evidence of thrombus. Normal compressibility, respiratory phasicity and response to augmentation. Central Greater Saphenous  Vein: No evidence of thrombus. Normal compressibility and flow on color Doppler imaging. Central Profunda Femoral Vein: No evidence of thrombus. Normal compressibility and flow on color Doppler imaging. Femoral Vein: No evidence of thrombus. Normal compressibility, respiratory phasicity and response to augmentation. Popliteal Vein: No evidence of thrombus. Normal compressibility, respiratory phasicity and response to augmentation. Calf Veins: No evidence of thrombus. Normal compressibility and flow on color Doppler imaging. Other Findings: An enlarged left inguinal lymph node with greatest axial diameter of 1.8 cm is noted. Subcutaneous calf edema is noted. RIGHT LOWER EXTREMITY Common Femoral Vein: No evidence of thrombus. Normal compressibility, respiratory phasicity and response to augmentation. IMPRESSION: 1. No evidence of left lower extremity deep vein thrombosis. 2. Left inguinal lymphadenopathy. 3. Subcutaneous edema is noted in the left calf. Marliss Coots, MD [facility] Radiology Electronically Signed   By: Marliss Coots MD   On: 11/18/2020 12:12    ____________________________________________   PROCEDURES Procedures  ____________________________________________    CLINICAL IMPRESSION / ASSESSMENT AND PLAN / ED COURSE  Medications ordered in the ED: Medications - No data to display  Pertinent labs & imaging results that were available during my care of the patient were reviewed by me and considered in my medical decision making (see chart for details).  ALMA MOHIUDDIN was evaluated in Emergency  Department on 11/18/2020 for the symptoms described in the history of present illness. She was evaluated in the context of the global COVID-19 pandemic, which necessitated consideration that the patient might be at risk for infection with the SARS-CoV-2 virus that causes COVID-19. Institutional protocols and algorithms that pertain to the evaluation of patients at risk for COVID-19 are in a state of rapid change based on information released by regulatory bodies including the CDC and federal and state organizations. These policies and algorithms were followed during the patient's care in the ED.   Patient presents with left leg pain after recent podiatry surgery.  Exam is unremarkable and not consistent with cellulitis DVT necrotizing fasciitis or other acute pathology, appears consistent with chronic peripheral edema.  Ultrasound is negative for DVT.  Vital signs are normal, patient is otherwise nontoxic and well-appearing and suitable for outpatient follow-up.      ____________________________________________   FINAL CLINICAL IMPRESSION(S) / ED DIAGNOSES    Final diagnoses:  Left leg pain  Peripheral edema     ED Discharge Orders    None      Portions of this note were generated with dragon dictation software. Dictation errors may occur despite best attempts at proofreading.   Sharman Cheek, MD 11/18/20 [phone]

## 2020-11-18 NOTE — ED Triage Notes (Signed)
Pt to ED from [facility] with c/o sudden onset L leg pain since yesterday. Pt with recent great toe amputation and part of 2nd toe amputation, pt's son reports had follow up appt with surgeon yesterday and site was clean/dry, no signs of infection. Pt reports 10/10 L leg pain at this time that started yesterday. Pt offered wheelchair on arrival to ED however refused.

## 2020-11-18 NOTE — ED Notes (Signed)
Pt presents to the ED for L leg pain. Pt states that it has been going on the past two days. Pt has a hx of L great toe amputation a month ago, and a partial L second toe amputation on last Thursday. Pt went in to see surgeon and family states that stitches looked good and dressing was changed. Pt is able to ambulate with walker. Pt is A&Ox4 and NAD. Denies the use of blood thinners.

## 2020-11-22 ENCOUNTER — Other Ambulatory Visit: Payer: Self-pay | Admitting: Family Medicine

## 2020-11-22 DIAGNOSIS — Z1231 Encounter for screening mammogram for malignant neoplasm of breast: Secondary | ICD-10-CM

## 2021-01-24 ENCOUNTER — Other Ambulatory Visit: Payer: Self-pay

## 2021-01-24 ENCOUNTER — Emergency Department: Payer: Medicare PPO

## 2021-01-24 ENCOUNTER — Encounter: Payer: Self-pay | Admitting: Emergency Medicine

## 2021-01-24 ENCOUNTER — Emergency Department
Admission: EM | Admit: 2021-01-24 | Discharge: 2021-01-24 | Disposition: A | Discharge status: Home / Self Care | Payer: Medicare PPO | Attending: Emergency Medicine | Admitting: Emergency Medicine

## 2021-01-24 DIAGNOSIS — Z79899 Other long term (current) drug therapy: Secondary | ICD-10-CM | POA: Insufficient documentation

## 2021-01-24 DIAGNOSIS — M545 Low back pain, unspecified: Secondary | ICD-10-CM | POA: Diagnosis not present

## 2021-01-24 DIAGNOSIS — Z20822 Contact with and (suspected) exposure to covid-19: Secondary | ICD-10-CM | POA: Insufficient documentation

## 2021-01-24 DIAGNOSIS — E039 Hypothyroidism, unspecified: Secondary | ICD-10-CM | POA: Diagnosis not present

## 2021-01-24 DIAGNOSIS — R4182 Altered mental status, unspecified: Secondary | ICD-10-CM | POA: Insufficient documentation

## 2021-01-24 DIAGNOSIS — R600 Localized edema: Secondary | ICD-10-CM | POA: Insufficient documentation

## 2021-01-24 DIAGNOSIS — I10 Essential (primary) hypertension: Secondary | ICD-10-CM | POA: Diagnosis not present

## 2021-01-24 DIAGNOSIS — R531 Weakness: Secondary | ICD-10-CM

## 2021-01-24 LAB — URINALYSIS, COMPLETE (UACMP) WITH MICROSCOPIC
Bacteria, UA: NONE SEEN
Bilirubin Urine: NEGATIVE
Glucose, UA: NEGATIVE mg/dL
Hgb urine dipstick: NEGATIVE
Ketones, ur: NEGATIVE mg/dL
Leukocytes,Ua: NEGATIVE
Nitrite: NEGATIVE
Protein, ur: NEGATIVE mg/dL
Specific Gravity, Urine: 1.006 (ref 1.005–1.030)
pH: 5 (ref 5.0–8.0)

## 2021-01-24 LAB — SARS CORONAVIRUS 2 BY RT PCR (HOSPITAL ORDER, PERFORMED IN ~~LOC~~ HOSPITAL LAB): SARS Coronavirus 2: NEGATIVE

## 2021-01-24 LAB — CBC WITH DIFFERENTIAL/PLATELET
Abs Immature Granulocytes: 0.01 10*3/uL (ref 0.00–0.07)
Basophils Absolute: 0 10*3/uL (ref 0.0–0.1)
Basophils Relative: 1 %
Eosinophils Absolute: 0.1 10*3/uL (ref 0.0–0.5)
Eosinophils Relative: 3 %
HCT: 37.7 % (ref 36.0–46.0)
Hemoglobin: 12.1 g/dL (ref 12.0–15.0)
Immature Granulocytes: 0 %
Lymphocytes Relative: 34 %
Lymphs Abs: 1.6 10*3/uL (ref 0.7–4.0)
MCH: 30.3 pg (ref 26.0–34.0)
MCHC: 32.1 g/dL (ref 30.0–36.0)
MCV: 94.3 fL (ref 80.0–100.0)
Monocytes Absolute: 0.6 10*3/uL (ref 0.1–1.0)
Monocytes Relative: 12 %
Neutro Abs: 2.4 10*3/uL (ref 1.7–7.7)
Neutrophils Relative %: 50 %
Platelets: 259 10*3/uL (ref 150–400)
RBC: 4 MIL/uL (ref 3.87–5.11)
RDW: 12.1 % (ref 11.5–15.5)
WBC: 4.7 10*3/uL (ref 4.0–10.5)
nRBC: 0 % (ref 0.0–0.2)

## 2021-01-24 LAB — COMPREHENSIVE METABOLIC PANEL
ALT: 7 U/L (ref 0–44)
AST: 20 U/L (ref 15–41)
Albumin: 3.2 g/dL — ABNORMAL LOW (ref 3.5–5.0)
Alkaline Phosphatase: 114 U/L (ref 38–126)
Anion gap: 12 (ref 5–15)
BUN: 10 mg/dL (ref 8–23)
CO2: 27 mmol/L (ref 22–32)
Calcium: 8.4 mg/dL — ABNORMAL LOW (ref 8.9–10.3)
Chloride: 99 mmol/L (ref 98–111)
Creatinine, Ser: 1.07 mg/dL — ABNORMAL HIGH (ref 0.44–1.00)
GFR, Estimated: 57 mL/min — ABNORMAL LOW (ref >60)
Glucose, Bld: 112 mg/dL — ABNORMAL HIGH (ref 70–99)
Potassium: 4.6 mmol/L (ref 3.5–5.1)
Sodium: 138 mmol/L (ref 135–145)
Total Bilirubin: 0.8 mg/dL (ref 0.3–1.2)
Total Protein: 6.5 g/dL (ref 6.5–8.1)

## 2021-01-24 LAB — LIPASE, BLOOD: Lipase: 23 U/L (ref 11–51)

## 2021-01-24 LAB — VALPROIC ACID LEVEL: Valproic Acid Lvl: 30 ug/mL — ABNORMAL LOW (ref 50.0–100.0)

## 2021-01-24 LAB — TROPONIN I (HIGH SENSITIVITY): Troponin I (High Sensitivity): 3 ng/L (ref <18)

## 2021-01-24 LAB — BRAIN NATRIURETIC PEPTIDE: B Natriuretic Peptide: 105.9 pg/mL — ABNORMAL HIGH (ref 0.0–100.0)

## 2021-01-24 MED ORDER — OXYCODONE-ACETAMINOPHEN 5-325 MG PO TABS
1.0000 | ORAL_TABLET | Freq: Once | ORAL | Status: AC
  Administered 2021-01-24: 1 via ORAL
  Filled 2021-01-24: qty 1

## 2021-01-24 MED ORDER — ACETAMINOPHEN 325 MG PO TABS
650.0000 mg | ORAL_TABLET | Freq: Once | ORAL | Status: AC
  Administered 2021-01-24: 650 mg via ORAL
  Filled 2021-01-24: qty 2

## 2021-01-24 MED ORDER — 1ST MEDX-PATCH/ LIDOCAINE 4-0.0375-5-20 % EX PTCH
1.0000 | MEDICATED_PATCH | CUTANEOUS | 0 refills | Status: DC
Start: 2021-01-24 — End: 2022-02-26

## 2021-01-24 MED ORDER — OXYBUTYNIN CHLORIDE 5 MG PO TABS
5.0000 mg | ORAL_TABLET | Freq: Once | ORAL | Status: AC
  Administered 2021-01-24: 5 mg via ORAL
  Filled 2021-01-24: qty 1

## 2021-01-24 MED ORDER — LORAZEPAM 0.5 MG PO TABS
0.5000 mg | ORAL_TABLET | Freq: Once | ORAL | Status: AC
  Administered 2021-01-24: 0.5 mg via ORAL
  Filled 2021-01-24: qty 1

## 2021-01-24 MED ORDER — KETOROLAC TROMETHAMINE 60 MG/2ML IM SOLN
30.0000 mg | Freq: Once | INTRAMUSCULAR | Status: AC
  Administered 2021-01-24: 30 mg via INTRAMUSCULAR
  Filled 2021-01-24: qty 2

## 2021-01-24 MED ORDER — METHOCARBAMOL 500 MG PO TABS: 500.0000 mg | ORAL_TABLET | Freq: Three times a day (TID) | ORAL | 0 refills | Status: AC | PRN

## 2021-01-24 MED ORDER — LIDOCAINE 5 % EX PTCH
1.0000 | MEDICATED_PATCH | CUTANEOUS | Status: DC
  Administered 2021-01-24: 1 via TRANSDERMAL
  Filled 2021-01-24: qty 1

## 2021-01-24 NOTE — ED Notes (Signed)
[name] (daughter) [phone]

## 2021-01-24 NOTE — ED Provider Notes (Signed)
[facility] Provider Note  ____________________________________________   Event Date/Time   First MD Initiated Contact with Patient 01/24/21 1010     (approximate)  I have reviewed the triage vital signs and the nursing notes.   HISTORY  Chief Complaint Back Pain   HPI Jacqueline Williams is a 70 y.o. female who reports to the emergency department for evaluation of acute on chronic low back pain. She states the pain has been recently worsening over the last several weeks, but that it was acutely worse this morning. She denies any loss of bowel or bladder control, denies extremity weakness, denies fevers, and denies saddles anesthesia symptoms. She states she took her prescribed Percocet 5-325 without any relief. She did not take tylenol in addition to this as she typically does. She denies any falls, trauma or other inciting event; denies urinary symptoms or any other symptoms. She also reports her daughter recently took her to a doctor where xrays were obtained at which time they discussed needing injections in the future.          Past Medical History:  Diagnosis Date  . Anxiety   . Back pain   . Bipolar disorder (HCC)   . Depression   . Hypertension   . Hypothyroidism   . Neuropathy   . Osteomyelitis of great toe of left foot (HCC) 2021  . Sepsis (HCC)   . Vitamin B12 deficiency     Patient Active Problem List   Diagnosis Date Noted  . Chronic osteomyelitis of toe, 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/11/2020  . Anemia   . Acute posthemorrhagic anemia 05/05/2017  . History of recent blood transfusion 05/05/2017  . Hyponatremia 05/05/2017  . Dehydration 05/05/2017  . Acute renal insufficiency 05/05/2017  . Leukocytosis 05/05/2017  . Diarrhea 05/05/2017  . GI bleed 04/28/2017    Past Surgical History:  Procedure Laterality Date  . ABDOMINAL HYSTERECTOMY    . AMPUTATION TOE Left 09/15/2020   Procedure: AMPUTATION TOE MPJ LEFT;  Surgeon: [name], DPM;  Location: [facility] ORS;  Service: Podiatry;  Laterality: Left;  . AMPUTATION TOE Left 11/10/2020   Procedure: AMPUTATION TOE IPJ LEFT 2ND;  Surgeon: [name], DPM;  Location: [facility] ORS;  Service: Podiatry;  Laterality: Left;  . CHOLECYSTECTOMY    . COLONOSCOPY WITH PROPOFOL N/A 04/30/2017   Procedure: COLONOSCOPY WITH PROPOFOL;  Surgeon: Wyline Mood, MD;  Location: [facility] ENDOSCOPY;  Service: Endoscopy;  Laterality: N/A;  . ENTEROSCOPY N/A 05/09/2017   Procedure: ENTEROSCOPY with deplopyement of capsule for small bowel;  Surgeon: Wyline Mood, MD;  Location: [facility] ENDOSCOPY;  Service: Endoscopy;  Laterality: N/A;  . ESOPHAGOGASTRODUODENOSCOPY (EGD) WITH PROPOFOL N/A 05/03/2017   Procedure: ESOPHAGOGASTRODUODENOSCOPY (EGD) WITH PROPOFOL;  Surgeon: Wyline Mood, MD;  Location: [facility] ENDOSCOPY;  Service: Gastroenterology;  Laterality: N/A;  . GASTRIC BYPASS     x2  . GIVENS CAPSULE STUDY N/A 05/07/2017   Procedure: GIVENS CAPSULE STUDY;  Surgeon: Wyline Mood, MD;  Location: [facility] ENDOSCOPY;  Service: Endoscopy;  Laterality: N/A;  . HERNIA REPAIR    . LUMBAR FUSION      Prior to Admission medications   Medication Sig Start Date End Date Taking? Authorizing Provider  Lido-Capsaicin-Men-Methyl Sal (1ST MEDX-PATCH/ LIDOCAINE) 4-0.374-05-11 % PT[facility] Apply 1 patch topically daily. 01/24/21  Yes Lucy Chris, PA  methocarbamol (ROBAXIN) 500 MG tablet Take 1 tablet (500 mg total) by mouth 3 (three) times daily as needed for up to 10 days for muscle spasms. 01/24/21  02/03/21 Yes Lucy Chris, PA  acetaminophen (TYLENOL) 325 MG tablet Take 650 mg by mouth every 6 (six) hours as needed.    [provider]  calcium-vitamin D (OSCAL WITH D) 500-200 MG-UNIT tablet Take 1 tablet by mouth.    [provider]  cetirizine (ZYRTEC) 10 MG tablet Take 10 mg by mouth daily.    [provider]  CREON 24000-76000 units CPEP Take 48,000 Units by mouth 3 (three) times daily before  meals.  04/28/17   [provider]  cyanocobalamin (,VITAMIN B-12,) 1000 MCG/ML injection Inject 1 mL into the skin every 14 (fourteen) days.  04/16/17   [provider]  DEPO-ESTRADIOL 5 MG/ML injection Inject 0.4 mg into the muscle every 28 (twenty-eight) days.  04/22/17   [provider]  ferrous gluconate (FERGON) 324 MG tablet Take 1 tablet (324 mg total) by mouth 3 (three) times daily with meals. Patient taking differently: Take 324 mg by mouth 2 (two) times daily with a meal.  05/05/17   Katharina Caper, MD  fluticasone (FLONASE) 50 MCG/ACT nasal spray Place 1 spray into both nostrils daily as needed for allergies or rhinitis.    [provider]  furosemide (LASIX) 20 MG tablet 20 mg daily.  03/31/17   [provider]  gabapentin (NEURONTIN) 100 MG capsule Take 200 mg by mouth at bedtime.  02/25/17   [provider]  lactase (LACTAID) 3000 units tablet Take by mouth 3 (three) times daily with meals.    [provider]  levothyroxine (SYNTHROID, LEVOTHROID) 25 MCG tablet Take 25 mcg by mouth daily. 04/26/17   [provider]  lisinopril (ZESTRIL) 10 MG tablet Take 10 mg by mouth daily.    [provider]  loperamide (IMODIUM) 2 MG capsule Take by mouth as needed for diarrhea or loose stools.    [provider]  LORazepam (ATIVAN) 1 MG tablet Take 0.5-1 mg by mouth 4 (four) times daily. 0.5 mg 0630,  1 mg 1100, 0.5 mg 1600, 1 mg 2030 04/05/17   [provider]  Melatonin 3 MG CAPS Take by mouth at bedtime.     [provider]  oxybutynin (DITROPAN) 5 MG tablet Take 5 mg by mouth 2 (two) times daily.    [provider]  oxyCODONE-acetaminophen (PERCOCET) 5-325 MG tablet Take 1 tablet by mouth 4 (four) times daily. [phone]    [provider]  oxyCODONE-acetaminophen (PERCOCET) 5-325 MG tablet Take 1 tablet by mouth every 6 (six) hours as needed for severe pain. 09/15/20  09/15/21  [name], DPM  pantoprazole (PROTONIX) 40 MG tablet Take 40 mg by mouth daily.    [provider]  potassium chloride (MICRO-K) 10 MEQ CR capsule 10 mEq 2 (two) times daily.  04/14/17   [provider]  promethazine (PHENERGAN) 25 MG tablet Take 25 mg by mouth every 6 (six) hours as needed. 04/28/17   [provider]  risperidone (RISPERDAL) 4 MG tablet Take 2 mg by mouth 2 (two) times daily.  03/11/17   [provider]  simethicone (MYLICON) 80 MG chewable tablet Chew 80 mg by mouth every 6 (six) hours as needed for flatulence.    [provider]  traZODone (DESYREL) 100 MG tablet Take 50 mg by mouth at bedtime.  04/14/17   [provider]  venlafaxine XR (EFFEXOR-XR) 150 MG 24 hr capsule Take 150 mg by mouth at bedtime.  02/25/17   [provider]    Allergies  Other, Penicillin g sodium, Aspirin, Ergotamine tartrate, Ibuprofen, Ketorolac tromethamine, Naproxen, Olanzapine, Oxaprozin, Penicillins, and Seroquel [quetiapine fumarate]  Family History  Problem Relation Age of Onset  . Lung cancer Father   . Hypertension Neg Hx   . Diabetes Neg Hx     Social History Social History   Tobacco Use  . Smoking status: Never Smoker  . Smokeless tobacco: Never Used  Vaping Use  . Vaping Use: Never used  Substance Use Topics  . Alcohol use: No    Comment: rare wine  . Drug use: No    Review of Systems  Constitutional: No fever/chills Eyes: No visual changes. ENT: No sore throat. Cardiovascular: Denies chest pain. Respiratory: Denies shortness of breath. Gastrointestinal: No abdominal pain.  No nausea, no vomiting.  No diarrhea.  No constipation. Genitourinary: Negative for dysuria. Musculoskeletal: + back pain. Skin: Negative for rash. Neurological: Negative for headaches, focal weakness or numbness.  ____________________________________________   PHYSICAL EXAM:  VITAL SIGNS: ED Triage Vitals  Enc Vitals  Group     BP 01/24/21 1005 (!) 114/56     Pulse Rate 01/24/21 1005 79     Resp 01/24/21 1005 20     Temp 01/24/21 1005 98 F (36.7 C)     Temp Source 01/24/21 1005 Oral     SpO2 01/24/21 1005 95 %     Weight 01/24/21 1005 210 lb (95.3 kg)     Height 01/24/21 1005 5\' 7"  (1.702 m)     Head Circumference --      Peak Flow --      Pain Score 01/24/21 1005 10     Pain Loc --      Pain Edu? --      Excl. in GC? --    Constitutional: Alert and oriented. Well appearing and in no acute distress. Eyes: Conjunctivae are normal. PERRL. EOMI. Head: Atraumatic. Neck: No stridor.   Cardiovascular: Normal rate, regular rhythm. Grossly normal heart sounds.   Respiratory: Normal respiratory effort.  No retractions. Lungs CTAB. Gastrointestinal: Soft and nontender. No distention. No abdominal bruits. No CVA tenderness. Musculoskeletal: No tenderness to palpation of the lumbar spine in the midline or paraspinal region. There is tenderness to the bilateral SI joints as well as gluteal region. 5/5 strength in bilateral plantar flexion, dorsiflexion, knee flexion and extension, hip flexion. Negative logroll of hips bilaterally. Calf size symmetrical bilaterally with 2+ pitting edema.  Neurologic:  Normal speech and language. No gross focal neurologic deficits are appreciated. No gait instability. Skin:  Skin is warm, dry and intact.  Psychiatric: Mood and affect are normal. Speech and behavior are normal.  ____________________________________________   LABS (all labs ordered are listed, but only abnormal results are displayed)  Labs Reviewed - No data to display  ____________________________________________  RADIOLOGY I, Lucy Chris, personally viewed and evaluated these images (plain radiographs) as part of my medical decision making, as well as reviewing the written report by the radiologist.  ED Provider interpretation:  Prior fusion in the lumbar spine without signs of hardware loosening.  No acute fracture identified.   Official radiology report(s): DG Lumbar Spine 2-3 Views  Result Date: 01/24/2021 CLINICAL DATA:  Sudden onset low back pain that began this morning. EXAM: LUMBAR SPINE - 2-3 VIEW COMPARISON:  None. FINDINGS: 5 nonrib bearing lumbar-type vertebral bodies. Vertebral body heights are maintained. No acute fracture. Generalized osteopenia. Grade 1 anterolisthesis of L5 on S1. No spondylolysis. Posterior lumbar interbody fusion at L5-S1. Mild degenerative disease  with disc height loss at L3-4. Osteoarthritis of bilateral SI joints. IMPRESSION: No acute osseous injury of the lumbar spine. Electronically Signed   By: Elige Ko   On: 01/24/2021 11:11   DG Pelvis 1-2 Views  Result Date: 01/24/2021 CLINICAL DATA:  Sudden onset lower back pain radiating into the legs. No trauma history EXAM: PELVIS - 1-2 VIEW COMPARISON:  None. FINDINGS: Generalized osteopenia. Lumbosacral fusion. No acute fracture or subluxation. No degenerative hip narrowing or spurring. IMPRESSION: No acute finding. Electronically Signed   By: Marnee Spring M.D.   On: 01/24/2021 11:04    ____________________________________________   INITIAL IMPRESSION / ASSESSMENT AND PLAN / ED COURSE  As part of my medical decision making, I reviewed the following data within the electronic MEDICAL RECORD NUMBER Nursing notes reviewed and incorporated, Radiograph reviewed and Notes from prior ED visits        Patient is a 70 yo F who reports to ED for complaints of acute on chronic low back pain without improvement with her percocet this morning. Denies trauma, falls, or red flag symptoms of saddle anesthesia, loss of bowel/bladder control or fevers. She has history of chronic back pain with prior fusion and recently was evaluated for this and told she needs injections. See HPI for further details. On PE, has minimal pain to palpation of lumbar spine, but does have pain over SI joints and into gluteal region. No other  significant positive findings were identified. Discussed the risks/benefits of low dose muscle relaxer, including increased risk for falls. She is aware she should not try these until she is with someone to see how they will effect her. Will decline any therapy with oral NSAIDS given h/o gastric bypass as well as GI bleeding. Will provide 1x dose of IM toradol here, provided she states she has used it in the past with great relief. Lastly, will recommend lidocaine patches topically for pain. Recommended she continue follow up with back doctor as previously scheduled. Patient stable at this time for outpatient therapy. She is to return with any acute worsening.       ____________________________________________   FINAL CLINICAL IMPRESSION(S) / ED DIAGNOSES  Final diagnoses:  Acute bilateral low back pain without sciatica     ED Discharge Orders         Ordered    methocarbamol (ROBAXIN) 500 MG tablet  3 times daily PRN        01/24/21 1130    Lido-Capsaicin-Men-Methyl Sal (1ST MEDX-PATCH/ LIDOCAINE) 4-0.374-05-11 % PT[facility]  Every 24 hours        01/24/21 1130          *Please note:  Jacqueline Williams was evaluated in Emergency Department on 01/24/2021 for the symptoms described in the history of present illness. She was evaluated in the context of the global COVID-19 pandemic, which necessitated consideration that the patient might be at risk for infection with the SARS-CoV-2 virus that causes COVID-19. Institutional protocols and algorithms that pertain to the evaluation of patients at risk for COVID-19 are in a state of rapid change based on information released by regulatory bodies including the CDC and federal and state organizations. These policies and algorithms were followed during the patient's care in the ED.  Some ED evaluations and interventions may be delayed as a result of limited staffing during and the pandemic.*   Note:  This document was prepared using Dragon voice recognition  software and may include unintentional dictation errors.   Lucy Chris,  PA 01/24/21 1156    Shaune Pollack, MD 01/25/21 [phone]

## 2021-01-24 NOTE — ED Notes (Signed)
Pt given meal tray at this time 

## 2021-01-24 NOTE — ED Notes (Signed)
See triage note  Presents with lower back pain  States pain started this am  Pain is in lower back and is non radiating  Increased pain with ambulation  Denies any fall

## 2021-01-24 NOTE — ED Notes (Signed)
Daughter called back  Spoke with this nurse and provider.  States the pt has been weak and nauseated  Had not been eating d/t not feeling well  Additional labs ordered.

## 2021-01-24 NOTE — ED Notes (Signed)
Two unsuccessful IV attempts, but labs were obtained. PA-C.

## 2021-01-24 NOTE — ED Notes (Signed)
Pt ambulated to and from the restroom using walker with standby assist. Steady gait.

## 2021-01-24 NOTE — ED Triage Notes (Signed)
Pt to ED via [facility] from [facility] with c/o back pain. Per EMS pt took prescribed pain medication without relief. Pt denies known injury at this time.

## 2021-01-24 NOTE — ED Provider Notes (Signed)
[facility] Department Provider Note  ____________________________________________   Event Date/Time   First MD Initiated Contact with Patient 01/24/21 1010     (approximate)  I have reviewed the triage vital signs and the nursing notes.   HISTORY  Chief Complaint Back Pain   HPI Jacqueline Williams is a 70 y.o. female who reports to the emergency department for evaluation of acute on chronic low back pain. She states the pain has been recently worsening over the last several weeks, but that it was acutely worse this morning. She denies any loss of bowel or bladder control, denies extremity weakness, denies fevers, and denies saddles anesthesia symptoms. She states she took her prescribed Percocet 5-325 without any relief. She did not take tylenol in addition to this as she typically does. She denies any falls, trauma or other inciting event; denies urinary symptoms or any other symptoms. She also reports her daughter recently took her to a doctor where xrays were obtained at which time they discussed needing injections in the future.          Past Medical History:  Diagnosis Date  . Anxiety   . Back pain   . Bipolar disorder (HCC)   . Depression   . Hypertension   . Hypothyroidism   . Neuropathy   . Osteomyelitis of great toe of left foot (HCC) 2021  . Sepsis (HCC)   . Vitamin B12 deficiency     Patient Active Problem List   Diagnosis Date Noted  . Chronic osteomyelitis of toe, 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/11/2020  . Anemia   . Acute posthemorrhagic anemia 05/05/2017  . History of recent blood transfusion 05/05/2017  . Hyponatremia 05/05/2017  . Dehydration 05/05/2017  . Acute renal insufficiency 05/05/2017  . Leukocytosis 05/05/2017  . Diarrhea 05/05/2017  . GI bleed 04/28/2017    Past Surgical History:  Procedure Laterality Date  . ABDOMINAL HYSTERECTOMY    . AMPUTATION TOE Left 09/15/2020   Procedure: AMPUTATION TOE MPJ LEFT;  Surgeon: [name], DPM;  Location: [facility] ORS;  Service: Podiatry;  Laterality: Left;  . AMPUTATION TOE Left 11/10/2020   Procedure: AMPUTATION TOE IPJ LEFT 2ND;  Surgeon: [name], DPM;  Location: [facility] ORS;  Service: Podiatry;  Laterality: Left;  . CHOLECYSTECTOMY    . COLONOSCOPY WITH PROPOFOL N/A 04/30/2017   Procedure: COLONOSCOPY WITH PROPOFOL;  Surgeon: Wyline Mood, MD;  Location: [facility] ENDOSCOPY;  Service: Endoscopy;  Laterality: N/A;  . ENTEROSCOPY N/A 05/09/2017   Procedure: ENTEROSCOPY with deplopyement of capsule for small bowel;  Surgeon: Wyline Mood, MD;  Location: [facility] ENDOSCOPY;  Service: Endoscopy;  Laterality: N/A;  . ESOPHAGOGASTRODUODENOSCOPY (EGD) WITH PROPOFOL N/A 05/03/2017   Procedure: ESOPHAGOGASTRODUODENOSCOPY (EGD) WITH PROPOFOL;  Surgeon: Wyline Mood, MD;  Location: [facility] ENDOSCOPY;  Service: Gastroenterology;  Laterality: N/A;  . GASTRIC BYPASS     x2  . GIVENS CAPSULE STUDY N/A 05/07/2017   Procedure: GIVENS CAPSULE STUDY;  Surgeon: Wyline Mood, MD;  Location: [facility] ENDOSCOPY;  Service: Endoscopy;  Laterality: N/A;  . HERNIA REPAIR    . LUMBAR FUSION      Prior to Admission medications   Medication Sig Start Date End Date Taking? Authorizing Provider  Lido-Capsaicin-Men-Methyl Sal (1ST MEDX-PATCH/ LIDOCAINE) 4-0.374-05-11 % PTCH Apply 1 patch topically daily. 01/24/21  Yes Lucy Chris, PA  methocarbamol (ROBAXIN) 500 MG tablet Take 1 tablet (500 mg total) by mouth 3 (three) times daily as needed for up to 10 days for muscle spasms. 01/24/21  02/03/21 Yes Lucy Chris, PA  acetaminophen (TYLENOL) 325 MG tablet Take 650 mg by mouth every 6 (six) hours as needed.    [provider]  calcium-vitamin D (OSCAL WITH D) 500-200 MG-UNIT tablet Take 1 tablet by mouth.    [provider]  cetirizine (ZYRTEC) 10 MG tablet Take 10 mg by mouth daily.    [provider]  CREON 24000-76000 units CPEP Take 48,000 Units by mouth 3 (three) times daily before  meals.  04/28/17   [provider]  cyanocobalamin (,VITAMIN B-12,) 1000 MCG/ML injection Inject 1 mL into the skin every 14 (fourteen) days.  04/16/17   [provider]  DEPO-ESTRADIOL 5 MG/ML injection Inject 0.4 mg into the muscle every 28 (twenty-eight) days.  04/22/17   [provider]  ferrous gluconate (FERGON) 324 MG tablet Take 1 tablet (324 mg total) by mouth 3 (three) times daily with meals. Patient taking differently: Take 324 mg by mouth 2 (two) times daily with a meal.  05/05/17   Katharina Caper, MD  fluticasone (FLONASE) 50 MCG/ACT nasal spray Place 1 spray into both nostrils daily as needed for allergies or rhinitis.    [provider]  furosemide (LASIX) 20 MG tablet 20 mg daily.  03/31/17   [provider]  gabapentin (NEURONTIN) 100 MG capsule Take 200 mg by mouth at bedtime.  02/25/17   [provider]  lactase (LACTAID) 3000 units tablet Take by mouth 3 (three) times daily with meals.    [provider]  levothyroxine (SYNTHROID, LEVOTHROID) 25 MCG tablet Take 25 mcg by mouth daily. 04/26/17   [provider]  lisinopril (ZESTRIL) 10 MG tablet Take 10 mg by mouth daily.    [provider]  loperamide (IMODIUM) 2 MG capsule Take by mouth as needed for diarrhea or loose stools.    [provider]  LORazepam (ATIVAN) 1 MG tablet Take 0.5-1 mg by mouth 4 (four) times daily. 0.5 mg 0630,  1 mg 1100, 0.5 mg 1600, 1 mg 2030 04/05/17   [provider]  Melatonin 3 MG CAPS Take by mouth at bedtime.     [provider]  oxybutynin (DITROPAN) 5 MG tablet Take 5 mg by mouth 2 (two) times daily.    [provider]  oxyCODONE-acetaminophen (PERCOCET) 5-325 MG tablet Take 1 tablet by mouth 4 (four) times daily. [phone]    [provider]  oxyCODONE-acetaminophen (PERCOCET) 5-325 MG tablet Take 1 tablet by mouth every 6 (six) hours as needed for severe pain. 09/15/20  09/15/21  [name], DPM  pantoprazole (PROTONIX) 40 MG tablet Take 40 mg by mouth daily.    [provider]  potassium chloride (MICRO-K) 10 MEQ CR capsule 10 mEq 2 (two) times daily.  04/14/17   [provider]  promethazine (PHENERGAN) 25 MG tablet Take 25 mg by mouth every 6 (six) hours as needed. 04/28/17   [provider]  risperidone (RISPERDAL) 4 MG tablet Take 2 mg by mouth 2 (two) times daily.  03/11/17   [provider]  simethicone (MYLICON) 80 MG chewable tablet Chew 80 mg by mouth every 6 (six) hours as needed for flatulence.    [provider]  traZODone (DESYREL) 100 MG tablet Take 50 mg by mouth at bedtime.  04/14/17   [provider]  venlafaxine XR (EFFEXOR-XR) 150 MG 24 hr capsule Take 150 mg by mouth at bedtime.  02/25/17   [provider]    Allergies  Other, Penicillin g sodium, Aspirin, Ergotamine tartrate, Ibuprofen, Ketorolac tromethamine, Naproxen, Olanzapine, Oxaprozin, Penicillins, and Seroquel [quetiapine fumarate]  Family History  Problem Relation Age of Onset  . Lung cancer Father   . Hypertension Neg Hx   . Diabetes Neg Hx     Social History Social History   Tobacco Use  . Smoking status: Never Smoker  . Smokeless tobacco: Never Used  Vaping Use  . Vaping Use: Never used  Substance Use Topics  . Alcohol use: No    Comment: rare wine  . Drug use: No    Review of Systems  Constitutional: No fever/chills Eyes: No visual changes. ENT: No sore throat. Cardiovascular: Denies chest pain. Respiratory: Denies shortness of breath. Gastrointestinal: No abdominal pain.  No nausea, no vomiting.  No diarrhea.  No constipation. Genitourinary: Negative for dysuria. Musculoskeletal: + back pain. Skin: Negative for rash. Neurological: Negative for headaches, focal weakness or numbness.  ____________________________________________   PHYSICAL EXAM:  VITAL SIGNS: ED Triage Vitals  Enc Vitals  Group     BP 01/24/21 1005 (!) 114/56     Pulse Rate 01/24/21 1005 79     Resp 01/24/21 1005 20     Temp 01/24/21 1005 98 F (36.7 C)     Temp Source 01/24/21 1005 Oral     SpO2 01/24/21 1005 95 %     Weight 01/24/21 1005 210 lb (95.3 kg)     Height 01/24/21 1005 5\' 7"  (1.702 m)     Head Circumference --      Peak Flow --      Pain Score 01/24/21 1005 10     Pain Loc --      Pain Edu? --      Excl. in GC? --    Constitutional: Alert and oriented. Well appearing and in no acute distress. Eyes: Conjunctivae are normal. PERRL. EOMI. Head: Atraumatic. Neck: No stridor.   Cardiovascular: Normal rate, regular rhythm. Grossly normal heart sounds.   Respiratory: Normal respiratory effort.  No retractions. Lungs CTAB. Gastrointestinal: Soft and nontender. No distention. No abdominal bruits. No CVA tenderness. Musculoskeletal: No tenderness to palpation of the lumbar spine in the midline or paraspinal region. There is tenderness to the bilateral SI joints as well as gluteal region. 5/5 strength in bilateral plantar flexion, dorsiflexion, knee flexion and extension, hip flexion. Negative logroll of hips bilaterally. Calf size symmetrical bilaterally with 2+ pitting edema.  Neurologic:  Normal speech and language. No gross focal neurologic deficits are appreciated. No gait instability. Skin:  Skin is warm, dry and intact.  Psychiatric: Mood and affect are normal. Speech and behavior are normal.  ____________________________________________   LABS (all labs ordered are listed, but only abnormal results are displayed)  Labs Reviewed  COMPREHENSIVE METABOLIC PANEL - Abnormal; Notable for the following components:      Result Value   Glucose, Bld 112 (*)    Creatinine, Ser 1.07 (*)    Calcium 8.4 (*)    Albumin 3.2 (*)    GFR, Estimated 57 (*)    All other components within normal limits  BRAIN NATRIURETIC PEPTIDE - Abnormal; Notable for the following components:   B Natriuretic Peptide  105.9 (*)    All other components within normal limits  URINALYSIS, COMPLETE (UACMP) WITH MICROSCOPIC - Abnormal; Notable for the following components:   Color, Urine YELLOW (*)    APPearance CLEAR (*)    All other components within normal limits  VALPROIC ACID LEVEL - Abnormal; Notable for  the following components:   Valproic Acid Lvl 30 (*)    All other components within normal limits  SARS CORONAVIRUS 2 BY RT PCR (HOSPITAL ORDER, PERFORMED IN [facility])  CBC WITH DIFFERENTIAL/PLATELET  LIPASE, BLOOD  TROPONIN I (HIGH SENSITIVITY)  TROPONIN I (HIGH SENSITIVITY)    ____________________________________________  RADIOLOGY I, Lucy Chris, personally viewed and evaluated these images (plain radiographs) as part of my medical decision making, as well as reviewing the written report by the radiologist.  ED Provider interpretation:  Prior fusion in the lumbar spine without signs of hardware loosening. No acute fracture identified.   Official radiology report(s): DG Lumbar Spine 2-3 Views  Result Date: 01/24/2021 CLINICAL DATA:  Sudden onset low back pain that began this morning. EXAM: LUMBAR SPINE - 2-3 VIEW COMPARISON:  None. FINDINGS: 5 nonrib bearing lumbar-type vertebral bodies. Vertebral body heights are maintained. No acute fracture. Generalized osteopenia. Grade 1 anterolisthesis of L5 on S1. No spondylolysis. Posterior lumbar interbody fusion at L5-S1. Mild degenerative disease with disc height loss at L3-4. Osteoarthritis of bilateral SI joints. IMPRESSION: No acute osseous injury of the lumbar spine. Electronically Signed   By: Elige Ko   On: 01/24/2021 11:11   DG Pelvis 1-2 Views  Result Date: 01/24/2021 CLINICAL DATA:  Sudden onset lower back pain radiating into the legs. No trauma history EXAM: PELVIS - 1-2 VIEW COMPARISON:  None. FINDINGS: Generalized osteopenia. Lumbosacral fusion. No acute fracture or subluxation. No degenerative hip narrowing or  spurring. IMPRESSION: No acute finding. Electronically Signed   By: Marnee Spring M.D.   On: 01/24/2021 11:04   CT Head Wo Contrast  Result Date: 01/24/2021 CLINICAL DATA:  Altered mental status.  Weakness. EXAM: CT HEAD WITHOUT CONTRAST TECHNIQUE: Contiguous axial images were obtained from the base of the skull through the vertex without intravenous contrast. COMPARISON:  Brain MRI 09/26/2017 FINDINGS: Brain: No evidence of acute infarction, hemorrhage, hydrocephalus, extra-axial collection or mass lesion/mass effect. Stable brain volume, normal for age. Stable ventricular prominence with unchanged ventricular morphology. Vascular: No hyperdense vessel. Skull: No fracture or focal lesion. Sinuses/Orbits: Scattered opacification of lower left mastoid air cells. The right mastoid air cells are clear. Paranasal sinuses are clear. Included orbits are unremarkable. Other: None. IMPRESSION: 1. No acute intracranial abnormality. 2. Scattered opacification of lower left mastoid air cells, mildly progressed from 2018. Electronically Signed   By: Narda Rutherford M.D.   On: 01/24/2021 15:14   CT Lumbar Spine Wo Contrast  Result Date: 01/24/2021 CLINICAL DATA:  Acute on chronic low back pain. Increased fracture risk. EXAM: CT LUMBAR SPINE WITHOUT CONTRAST TECHNIQUE: Multidetector CT imaging of the lumbar spine was performed without intravenous contrast administration. Multiplanar CT image reconstructions were also generated. COMPARISON:  Lumbar spine radiographs 01/24/2021 FINDINGS: Segmentation: The lowest lumbar type vertebra is designated L5. There are no ribs at T12. Alignment: Straightening of the normal lordosis in the lower lumbar spine. Trace anterolisthesis of L3 on L4. 8 mm anterolisthesis of L5 on S1. Vertebrae: No fracture or suspicious osseous lesion. Prior L5-S1 posterior and interbody fusion. Subsidence of the interbody spacer at L5-S1 with surrounding sclerosis, gas in the disc space, and no solid  interbody osseous fusion. There may be partial ankylosis across the left L5-S1 facet joint. Mild lucency about both S1 pedicle screws. Vacuum disc and mild disc space narrowing at L3-4 and L4-5. Paraspinal and other soft tissues: Mild abdominal aortic atherosclerosis without aneurysm. Disc levels: T12-L1: Mild facet arthrosis without evidence of  stenosis. L1-2: Disc bulging and mild facet and ligamentum flavum hypertrophy result in mild spinal stenosis and mild left neural foraminal stenosis. L2-3: Disc bulging and moderate facet and ligamentum flavum hypertrophy result in borderline spinal stenosis and mild right neural foraminal stenosis. L3-4: Disc bulging and severe facet and ligamentum flavum hypertrophy result in mild to moderate spinal stenosis and moderate right and mild left neural foraminal stenosis. L4-5: Disc bulging and severe facet and ligamentum flavum hypertrophy result in severe spinal stenosis and moderate right and mild left neural foraminal stenosis. L5-S1: Prior right laminectomy and fusion. Limited assessment of the spinal canal due to streak artifact. Anterolisthesis, spurring, and disc space height loss result in moderate bilateral neural foraminal stenosis. IMPRESSION: 1. No acute osseous abnormality. 2. Prior L5-S1 fusion without solid interbody osseous fusion. Lucency about both S1 pedicle screws suggestive of loosening and gas in the L5-S1 disc space suggestive of motion at this level. 3. Severe multifactorial spinal stenosis and moderate right neural foraminal stenosis at L4-5. 4. Mild-to-moderate spinal and neural foraminal stenosis at L3-4. 5. Moderate bilateral neural foraminal stenosis at L5-S1. 6. Aortic Atherosclerosis (ICD10-I70.0). Electronically Signed   By: Sebastian AcheAllen  Grady M.D.   On: 01/24/2021 15:35   DG Chest Portable 1 View  Result Date: 01/24/2021 CLINICAL DATA:  Weakness. EXAM: PORTABLE CHEST 1 VIEW COMPARISON:  05/03/2017 FINDINGS: The heart size and mediastinal contours  are within normal limits. Pulmonary vascular congestion. No pleural effusion or edema. No airspace densities. The visualized skeletal structures are unremarkable. IMPRESSION: Pulmonary vascular congestion. Electronically Signed   By: Signa Kellaylor  Stroud M.D.   On: 01/24/2021 13:52    ____________________________________________   INITIAL IMPRESSION / ASSESSMENT AND PLAN / ED COURSE  As part of my medical decision making, I reviewed the following data within the electronic MEDICAL RECORD NUMBER Nursing notes reviewed and incorporated, Radiograph reviewed and Notes from prior ED visits        Patient is a 70 yo F who reports to ED for complaints of acute on chronic low back pain without improvement with her percocet this morning. Denies trauma, falls, or red flag symptoms of saddle anesthesia, loss of bowel/bladder control or fevers. She has history of chronic back pain with prior fusion and recently was evaluated for this and told she needs injections. See HPI for further details. On PE, has minimal pain to palpation of lumbar spine, but does have pain over SI joints and into gluteal region. No other significant positive findings were identified. Discussed the risks/benefits of low dose muscle relaxer, including increased risk for falls. She is aware she should not try these until she is with someone to see how they will effect her. Will decline any therapy with oral NSAIDS given h/o gastric bypass as well as GI bleeding. Will provide 1x dose of IM toradol here, provided she states she has used it in the past with great relief. Lastly, will recommend lidocaine patches topically for pain. Recommended she continue follow up with back doctor as previously scheduled. Patient stable at this time for outpatient therapy. She is to return with any acute worsening.     Addendum: ----------------------------------------- 12:25 PM on 01/24/2021 -----------------------------------------  At discharge, the patient's  daughter was called by nursing to inform her of the patient's status as well as that she was ready given the daughter is her guardian. The daughter informed nursing, and then myself that she has complained of profound weakness over the last two weeks to include nausea, diarrhea. She has  not been going to meals offered by her facility due to weakness. Daughter states she sends a caregiver to her place 3x weekly to help with baths but the patient has been refusing those as well due to weakness. Discussed with daughter we will initiate further work up at this time, and will contact her back with any findings. Patient remains stable at this time, vitals stable and does not appear acutely toxic. Patient updated and amenable with further work up.   ----------------------------------------- 4:02 PM on 01/24/2021 -----------------------------------------   Weakness work up continued with CBC, CMP, lipase, troponin, Covid test, and BNP which are all grossly within normal for the patient. Mild bump in creatinine is stable from a few days ago when labs were drawn by PCP, as evidenced in chart review. Depakote level was obtained, and is slightly lower than therapeutic, but this is a new medication that per daughter is still being titrated, and her level is higher than in the low 20s when drawn by PCP a few days ago as well. EKG is negative for any acute ischemia.   Given these negative findings, case was discussed with Dr. Erma Heritage, who recommended follow up with CT Head and CT lumbar spine to r/o other pathologies of patient's reported weakness. These were negative for any emergent findings. There is evidence of possible hardware loosening and failure of fusion.  The findings were discussed with the patient, who is ready to go home and feels stable. Findings were discussed with the son-in-law, given the daughter not available at the moment. He is amenable with her negative work up and feels she is stable to be home as  well. In addition, they will seek the care of their PCP to discuss possibly needed more advanced help for the patient in terms of assisted living. Patient is stable at this time for outpatient therapy.      ____________________________________________   FINAL CLINICAL IMPRESSION(S) / ED DIAGNOSES  Final diagnoses:  Acute bilateral low back pain without sciatica  Weakness     ED Discharge Orders         Ordered    methocarbamol (ROBAXIN) 500 MG tablet  3 times daily PRN        01/24/21 1130    Lido-Capsaicin-Men-Methyl Sal (1ST MEDX-PATCH/ LIDOCAINE) 4-0.374-05-11 % PTCH  Every 24 hours        01/24/21 1130          *Please note:  Jacqueline Williams was evaluated in Emergency Department on 01/24/2021 for the symptoms described in the history of present illness. She was evaluated in the context of the global COVID-19 pandemic, which necessitated consideration that the patient might be at risk for infection with the SARS-CoV-2 virus that causes COVID-19. Institutional protocols and algorithms that pertain to the evaluation of patients at risk for COVID-19 are in a state of rapid change based on information released by regulatory bodies including the CDC and federal and state organizations. These policies and algorithms were followed during the patient's care in the ED.  Some ED evaluations and interventions may be delayed as a result of limited staffing during and the pandemic.*   Note:  This document was prepared using Dragon voice recognition software and may include unintentional dictation errors.   Lucy Chris, PA 01/24/21 1156    Lucy Chris, PA 01/24/21 Flossie Buffy    Shaune Pollack, MD 01/25/21 [phone]

## 2021-07-11 ENCOUNTER — Other Ambulatory Visit: Payer: Self-pay | Admitting: Neurosurgery

## 2021-07-11 DIAGNOSIS — M5416 Radiculopathy, lumbar region: Secondary | ICD-10-CM

## 2021-07-19 ENCOUNTER — Ambulatory Visit
Admission: RE | Admit: 2021-07-19 | Discharge: 2021-07-19 | Disposition: A | Discharge status: Home / Self Care | Payer: Medicare PPO | Source: Ambulatory Visit | Attending: Neurosurgery | Admitting: Neurosurgery

## 2021-07-19 ENCOUNTER — Other Ambulatory Visit: Payer: Self-pay

## 2021-07-19 DIAGNOSIS — M5416 Radiculopathy, lumbar region: Secondary | ICD-10-CM | POA: Diagnosis present

## 2021-10-24 ENCOUNTER — Other Ambulatory Visit: Payer: Self-pay | Admitting: Neurosurgery

## 2021-11-06 ENCOUNTER — Other Ambulatory Visit: Payer: Medicare PPO

## 2021-11-19 ENCOUNTER — Other Ambulatory Visit: Admission: RE | Admit: 2021-11-19 | Payer: Medicare PPO | Source: Ambulatory Visit

## 2021-11-21 ENCOUNTER — Inpatient Hospital Stay: Admit: 2021-11-21 | Payer: Medicare PPO | Admitting: Neurosurgery

## 2021-11-21 SURGERY — POSTERIOR LUMBAR FUSION 4 WITH HARDWARE REMOVAL
Anesthesia: General

## 2021-11-29 IMAGING — MR MR FOOT*L* WO/W CM
9 series · 40 of 40 positions shown · IV contrast (gadavist)
Comparison: None.

CLINICAL DATA: Osteomyelitis of the great toe.

EXAM:
MRI OF THE LEFT FOREFOOT WITHOUT AND WITH CONTRAST
TECHNIQUE: Multiplanar, multisequence MR imaging of the left forefoot was
performed both before and after administration of intravenous
contrast.
CONTRAST:  10mL GADAVIST GADOBUTROL 1 MMOL/ML IV SOLN

[Series 5: T1 · coronal · left · 3.0mm · 0.38mm/px · 6 of 45 slices shown (1 of 2)]
[im 1/45]
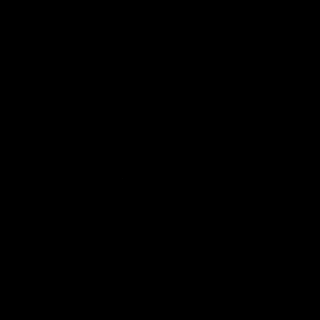
[im 9/45]
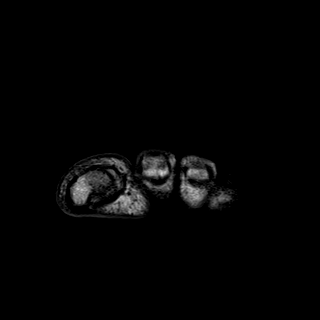
[im 18/45]
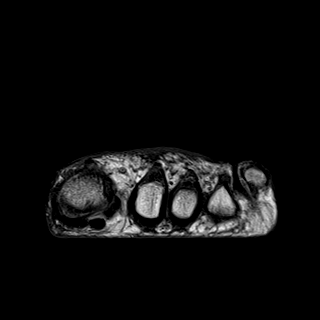
[im 27/45]
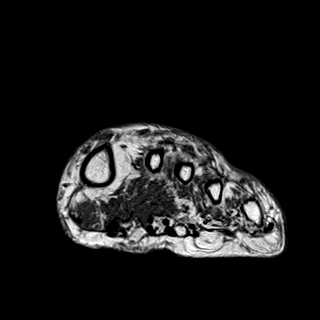
[im 36/45]
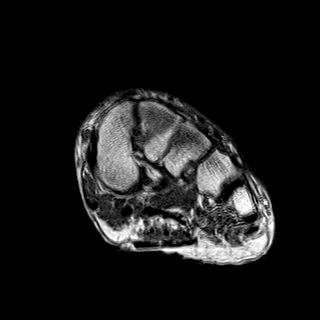
[im 45/45]
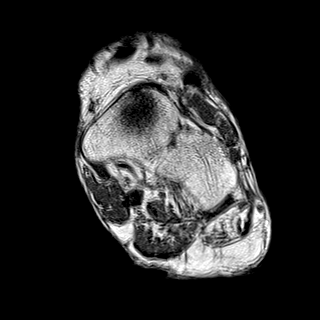

[Series 7: T2 · coronal · left · 3.0mm · 0.38mm/px · 6 of 45 slices shown (1 of 2)]
[im 1/45]
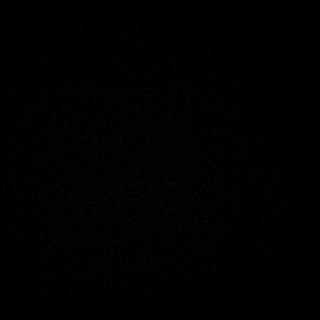
[im 9/45]
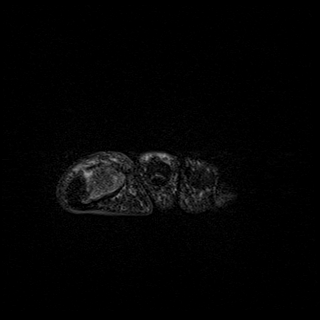
[im 18/45]
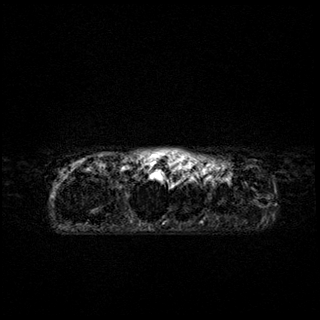
[im 27/45]
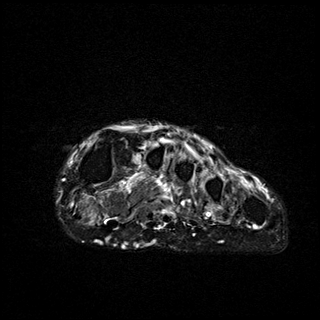
[im 36/45]
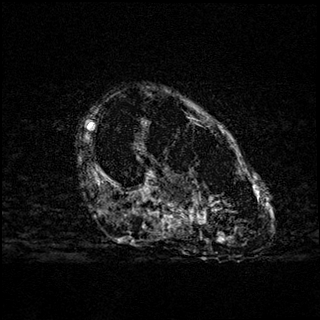
[im 45/45]
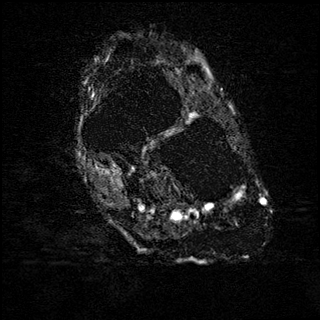

[Series 8: T1 · oblique · left · 3.0mm · 0.70mm/px · 3 of 18 slices shown (2 of 2)]
[im 1/18]
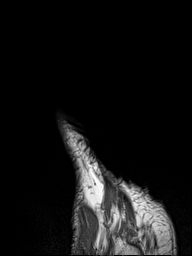
[im 9/18]
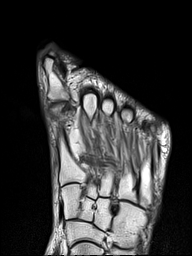
[im 18/18]
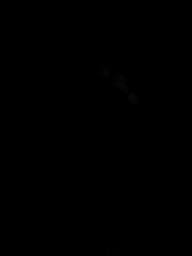

[Series 10: T2 · oblique · left · 3.0mm · 0.70mm/px · 3 of 18 slices shown (2 of 2)]
[im 1/18]
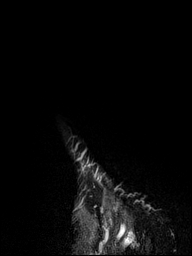
[im 9/18]
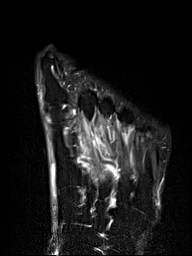
[im 18/18]
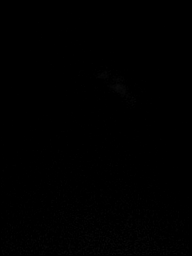

[Series 12: T1 fat-sat · coronal · non-contrast · left · 3.0mm · 0.47mm/px · 6 of 40 slices shown (1 of 3)]
[im 1/40]
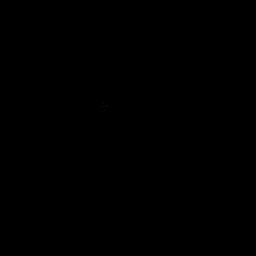
[im 8/40]
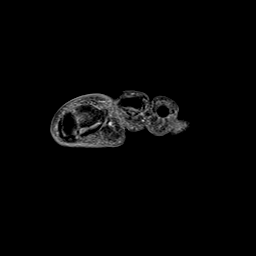
[im 16/40]
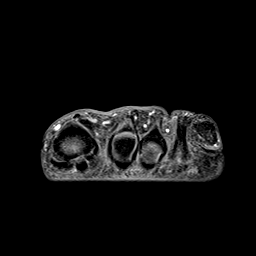
[im 24/40]
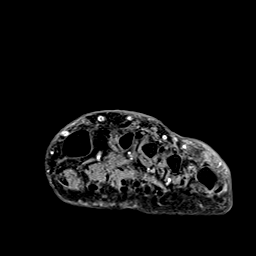
[im 32/40]
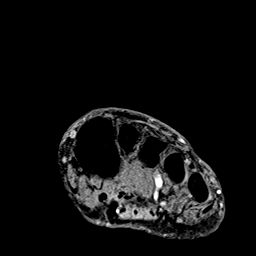
[im 40/40]
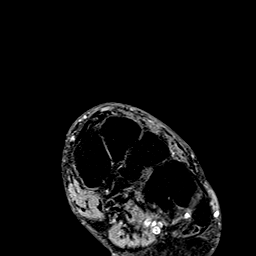

[Series 13: STIR · sagittal · left · 3.0mm · 0.62mm/px · 4 of 27 slices shown]
[im 1/27]
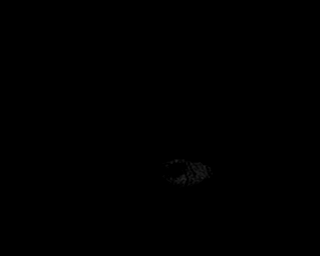
[im 9/27]
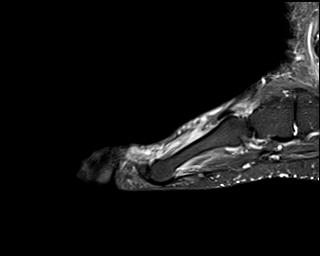
[im 18/27]
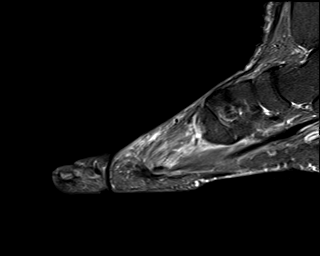
[im 27/27]
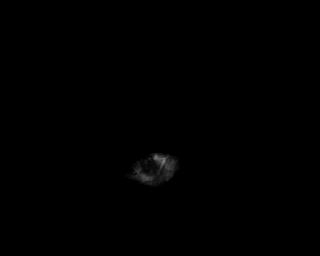

[Series 14: T1 fat-sat post-contrast · coronal · left · 3.0mm · 0.47mm/px · 6 of 40 slices shown]
[im 1/40]
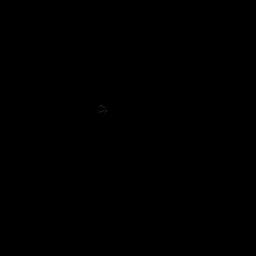
[im 8/40]
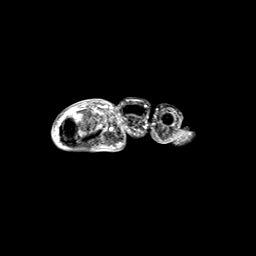
[im 16/40]
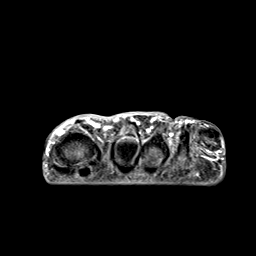
[im 24/40]
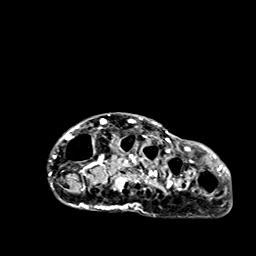
[im 32/40]
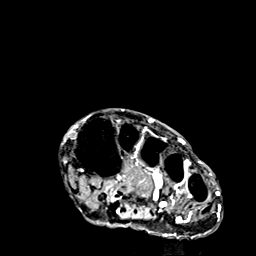
[im 40/40]
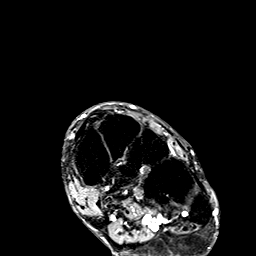

[Series 15: T1 fat-sat · sagittal · left · 3.0mm · 0.62mm/px · 3 of 20 slices shown (2 of 3)]
[im 1/20]
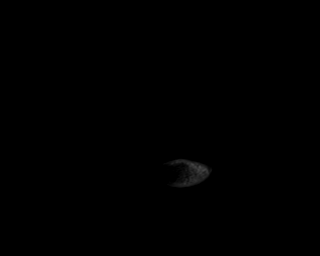
[im 10/20]
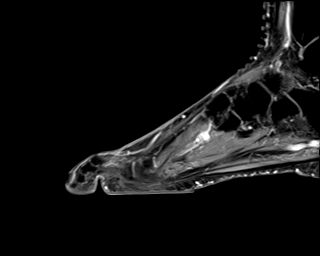
[im 20/20]
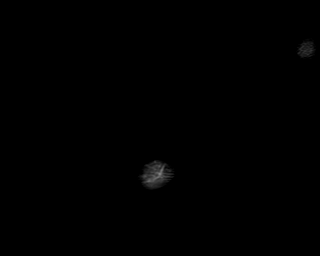

[Series 17: T1 fat-sat · oblique · left · 3.0mm · 0.56mm/px · 3 of 17 slices shown (3 of 3)]
[im 1/17]
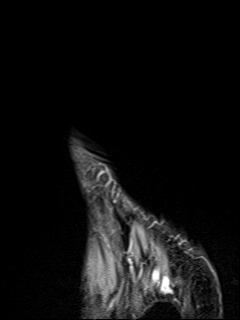
[im 9/17]
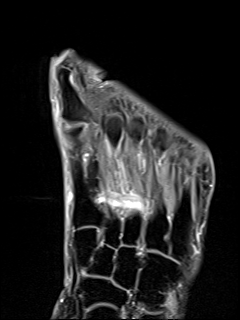
[im 17/17]
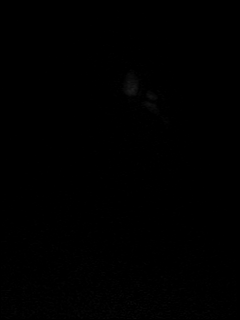

[40 of 40 positions shown; findings below may reference images not displayed]

FINDINGS: Bones/Joint/Cartilage

There is abnormal edema and enhancement of the distal phalanx of the
great toe. There is abnormal edema and enhancement of the soft
tissues of the distal aspect of the great toe occluding under the
nail. This is consistent with cellulitis. No effusion at the IP
joint of the great toe. Slight enhancement of the joint after
contrast administration.

The remainder of the visualized bones of the forefoot and midfoot
are normal.

Ligaments

Normal.

Muscles and Tendons

Normal.

Soft tissues

Minimal soft tissue edema at the dorsum of the foot. Edema and
enhancement of the soft tissues of the distal aspect of the great
toe consistent with cellulitis.
IMPRESSION: 1. Findings consistent with osteomyelitis of the distal phalanx of
the great toe.
2. Cellulitis of the distal aspect of the great toe.

## 2022-01-29 ENCOUNTER — Emergency Department: Payer: Medicare PPO

## 2022-01-29 ENCOUNTER — Other Ambulatory Visit: Payer: Self-pay

## 2022-01-29 ENCOUNTER — Observation Stay
Admission: EM | Admit: 2022-01-29 | Discharge: 2022-01-30 | Disposition: A | Discharge status: Home / Self Care | Payer: Medicare PPO | Attending: Internal Medicine | Admitting: Internal Medicine

## 2022-01-29 ENCOUNTER — Observation Stay: Payer: Medicare PPO

## 2022-01-29 ENCOUNTER — Encounter: Payer: Self-pay | Admitting: Internal Medicine

## 2022-01-29 DIAGNOSIS — R262 Difficulty in walking, not elsewhere classified: Secondary | ICD-10-CM | POA: Diagnosis not present

## 2022-01-29 DIAGNOSIS — E039 Hypothyroidism, unspecified: Secondary | ICD-10-CM | POA: Insufficient documentation

## 2022-01-29 DIAGNOSIS — Z7189 Other specified counseling: Secondary | ICD-10-CM

## 2022-01-29 DIAGNOSIS — Z20822 Contact with and (suspected) exposure to covid-19: Principal | ICD-10-CM | POA: Insufficient documentation

## 2022-01-29 DIAGNOSIS — E876 Hypokalemia: Secondary | ICD-10-CM | POA: Diagnosis not present

## 2022-01-29 DIAGNOSIS — G8929 Other chronic pain: Secondary | ICD-10-CM | POA: Insufficient documentation

## 2022-01-29 DIAGNOSIS — E669 Obesity, unspecified: Secondary | ICD-10-CM | POA: Diagnosis present

## 2022-01-29 DIAGNOSIS — W1839XA Other fall on same level, initial encounter: Secondary | ICD-10-CM | POA: Insufficient documentation

## 2022-01-29 DIAGNOSIS — G9340 Encephalopathy, unspecified: Secondary | ICD-10-CM | POA: Diagnosis not present

## 2022-01-29 DIAGNOSIS — Z79899 Other long term (current) drug therapy: Secondary | ICD-10-CM | POA: Diagnosis not present

## 2022-01-29 DIAGNOSIS — M6281 Muscle weakness (generalized): Secondary | ICD-10-CM | POA: Insufficient documentation

## 2022-01-29 DIAGNOSIS — F32A Depression, unspecified: Secondary | ICD-10-CM | POA: Diagnosis present

## 2022-01-29 DIAGNOSIS — R531 Weakness: Secondary | ICD-10-CM | POA: Diagnosis present

## 2022-01-29 DIAGNOSIS — G929 Unspecified toxic encephalopathy: Secondary | ICD-10-CM | POA: Insufficient documentation

## 2022-01-29 DIAGNOSIS — I10 Essential (primary) hypertension: Secondary | ICD-10-CM | POA: Diagnosis not present

## 2022-01-29 DIAGNOSIS — F419 Anxiety disorder, unspecified: Secondary | ICD-10-CM | POA: Diagnosis present

## 2022-01-29 DIAGNOSIS — E66812 Obesity, class 2: Secondary | ICD-10-CM | POA: Diagnosis present

## 2022-01-29 DIAGNOSIS — E041 Nontoxic single thyroid nodule: Secondary | ICD-10-CM | POA: Diagnosis present

## 2022-01-29 DIAGNOSIS — R4182 Altered mental status, unspecified: Secondary | ICD-10-CM

## 2022-01-29 LAB — URINALYSIS, COMPLETE (UACMP) WITH MICROSCOPIC
Bilirubin Urine: NEGATIVE
Glucose, UA: NEGATIVE mg/dL
Hgb urine dipstick: NEGATIVE
Ketones, ur: NEGATIVE mg/dL
Leukocytes,Ua: NEGATIVE
Nitrite: NEGATIVE
Protein, ur: NEGATIVE mg/dL
Specific Gravity, Urine: 1.005 (ref 1.005–1.030)
pH: 6 (ref 5.0–8.0)

## 2022-01-29 LAB — CBC WITH DIFFERENTIAL/PLATELET
Abs Immature Granulocytes: 0.01 10*3/uL (ref 0.00–0.07)
Basophils Absolute: 0 10*3/uL (ref 0.0–0.1)
Basophils Relative: 1 %
Eosinophils Absolute: 0 10*3/uL (ref 0.0–0.5)
Eosinophils Relative: 1 %
HCT: 37.1 % (ref 36.0–46.0)
Hemoglobin: 12.4 g/dL (ref 12.0–15.0)
Immature Granulocytes: 0 %
Lymphocytes Relative: 14 %
Lymphs Abs: 0.8 10*3/uL (ref 0.7–4.0)
MCH: 31.1 pg (ref 26.0–34.0)
MCHC: 33.4 g/dL (ref 30.0–36.0)
MCV: 93 fL (ref 80.0–100.0)
Monocytes Absolute: 0.5 10*3/uL (ref 0.1–1.0)
Monocytes Relative: 9 %
Neutro Abs: 4.1 10*3/uL (ref 1.7–7.7)
Neutrophils Relative %: 75 %
Platelets: 269 10*3/uL (ref 150–400)
RBC: 3.99 MIL/uL (ref 3.87–5.11)
RDW: 12.3 % (ref 11.5–15.5)
WBC: 5.4 10*3/uL (ref 4.0–10.5)
nRBC: 0 % (ref 0.0–0.2)

## 2022-01-29 LAB — COMPREHENSIVE METABOLIC PANEL
ALT: 31 U/L (ref 0–44)
AST: 40 U/L (ref 15–41)
Albumin: 3.6 g/dL (ref 3.5–5.0)
Alkaline Phosphatase: 125 U/L (ref 38–126)
Anion gap: 15 (ref 5–15)
BUN: 15 mg/dL (ref 8–23)
CO2: 33 mmol/L — ABNORMAL HIGH (ref 22–32)
Calcium: 9.2 mg/dL (ref 8.9–10.3)
Chloride: 93 mmol/L — ABNORMAL LOW (ref 98–111)
Creatinine, Ser: 0.92 mg/dL (ref 0.44–1.00)
GFR, Estimated: >60 mL/min (ref >60)
Glucose, Bld: 130 mg/dL — ABNORMAL HIGH (ref 70–99)
Potassium: 3 mmol/L — ABNORMAL LOW (ref 3.5–5.1)
Sodium: 141 mmol/L (ref 135–145)
Total Bilirubin: 0.5 mg/dL (ref 0.3–1.2)
Total Protein: 7 g/dL (ref 6.5–8.1)

## 2022-01-29 LAB — VALPROIC ACID LEVEL: Valproic Acid Lvl: 12 ug/mL — ABNORMAL LOW (ref 50.0–100.0)

## 2022-01-29 LAB — RESP PANEL BY RT-PCR (FLU A&B, COVID) ARPGX2
Influenza A by PCR: NEGATIVE
Influenza B by PCR: NEGATIVE
SARS Coronavirus 2 by RT PCR: NEGATIVE

## 2022-01-29 LAB — AMMONIA: Ammonia: 18 umol/L (ref 9–35)

## 2022-01-29 LAB — TSH: TSH: 2.469 u[IU]/mL (ref 0.350–4.500)

## 2022-01-29 LAB — URINE DRUG SCREEN, QUALITATIVE (ARMC ONLY)
Amphetamines, Ur Screen: NOT DETECTED
Barbiturates, Ur Screen: NOT DETECTED
Benzodiazepine, Ur Scrn: NOT DETECTED
Cannabinoid 50 Ng, Ur ~~LOC~~: NOT DETECTED
Cocaine Metabolite,Ur ~~LOC~~: NOT DETECTED
MDMA (Ecstasy)Ur Screen: NOT DETECTED
Methadone Scn, Ur: NOT DETECTED
Opiate, Ur Screen: NOT DETECTED
Phencyclidine (PCP) Ur S: NOT DETECTED
Tricyclic, Ur Screen: NOT DETECTED

## 2022-01-29 LAB — PROTIME-INR
INR: 0.9 (ref 0.8–1.2)
Prothrombin Time: 12.2 seconds (ref 11.4–15.2)

## 2022-01-29 LAB — LACTIC ACID, PLASMA
Lactic Acid, Venous: 1.7 mmol/L (ref 0.5–1.9)
Lactic Acid, Venous: 1.1 mmol/L (ref 0.5–1.9)

## 2022-01-29 LAB — APTT: aPTT: 20 seconds — ABNORMAL LOW (ref 24–36)

## 2022-01-29 LAB — PROCALCITONIN: Procalcitonin: <0.1 ng/mL

## 2022-01-29 LAB — PHOSPHORUS: Phosphorus: 3.5 mg/dL (ref 2.5–4.6)

## 2022-01-29 LAB — MAGNESIUM: Magnesium: 1.9 mg/dL (ref 1.7–2.4)

## 2022-01-29 MED ORDER — LORAZEPAM 1 MG PO TABS
1.0000 mg | ORAL_TABLET | Freq: Two times a day (BID) | ORAL | Status: DC
  Administered 2022-01-29 – 2022-01-30 (×2): 1 mg via ORAL
  Filled 2022-01-29 (×2): qty 1

## 2022-01-29 MED ORDER — LORATADINE 10 MG PO TABS
10.0000 mg | ORAL_TABLET | Freq: Every day | ORAL | Status: DC
  Administered 2022-01-30: 10:00:00 10 mg via ORAL
  Filled 2022-01-29: qty 1

## 2022-01-29 MED ORDER — LACTASE 3000 UNITS PO TABS
3000.0000 [IU] | ORAL_TABLET | Freq: Three times a day (TID) | ORAL | Status: DC
  Administered 2022-01-29 – 2022-01-30 (×3): 3000 [IU] via ORAL
  Filled 2022-01-29 (×4): qty 1

## 2022-01-29 MED ORDER — PROMETHAZINE HCL 25 MG PO TABS
25.0000 mg | ORAL_TABLET | Freq: Four times a day (QID) | ORAL | Status: DC | PRN
  Filled 2022-01-29: qty 1

## 2022-01-29 MED ORDER — DIVALPROEX SODIUM 250 MG PO DR TAB
250.0000 mg | DELAYED_RELEASE_TABLET | Freq: Two times a day (BID) | ORAL | Status: DC
  Administered 2022-01-29 – 2022-01-30 (×2): 250 mg via ORAL
  Filled 2022-01-29 (×3): qty 1

## 2022-01-29 MED ORDER — POTASSIUM CHLORIDE CRYS ER 10 MEQ PO TBCR
10.0000 meq | EXTENDED_RELEASE_TABLET | Freq: Two times a day (BID) | ORAL | Status: DC
  Administered 2022-01-29 – 2022-01-30 (×2): 10 meq via ORAL
  Filled 2022-01-29 (×2): qty 1

## 2022-01-29 MED ORDER — FLUTICASONE PROPIONATE 50 MCG/ACT NA SUSP
1.0000 | Freq: Every day | NASAL | Status: DC | PRN
  Filled 2022-01-29: qty 16

## 2022-01-29 MED ORDER — ACETAMINOPHEN 650 MG RE SUPP: 650.0000 mg | Freq: Four times a day (QID) | RECTAL | Status: DC | PRN

## 2022-01-29 MED ORDER — TRAZODONE HCL 50 MG PO TABS
50.0000 mg | ORAL_TABLET | Freq: Every day | ORAL | Status: DC
  Administered 2022-01-29: 50 mg via ORAL
  Filled 2022-01-29: qty 1

## 2022-01-29 MED ORDER — LEVOTHYROXINE SODIUM 25 MCG PO TABS
25.0000 ug | ORAL_TABLET | Freq: Every day | ORAL | Status: DC
  Administered 2022-01-30: 05:00:00 25 ug via ORAL
  Filled 2022-01-29: qty 1

## 2022-01-29 MED ORDER — ONDANSETRON HCL 4 MG PO TABS
4.0000 mg | ORAL_TABLET | Freq: Four times a day (QID) | ORAL | Status: DC | PRN
  Administered 2022-01-30: 4 mg via ORAL
  Filled 2022-01-29: qty 1

## 2022-01-29 MED ORDER — FERROUS GLUCONATE 324 (38 FE) MG PO TABS
324.0000 mg | ORAL_TABLET | Freq: Two times a day (BID) | ORAL | Status: DC
  Administered 2022-01-30: 324 mg via ORAL
  Filled 2022-01-29 (×2): qty 1

## 2022-01-29 MED ORDER — ENOXAPARIN SODIUM 40 MG/0.4ML IJ SOSY
40.0000 mg | PREFILLED_SYRINGE | INTRAMUSCULAR | Status: DC
  Administered 2022-01-29: 15:00:00 40 mg via SUBCUTANEOUS
  Filled 2022-01-29: qty 0.4

## 2022-01-29 MED ORDER — ONDANSETRON HCL 4 MG/2ML IJ SOLN: 4.0000 mg | Freq: Four times a day (QID) | INTRAMUSCULAR | Status: DC | PRN

## 2022-01-29 MED ORDER — LOPERAMIDE HCL 2 MG PO CAPS: 2.0000 mg | ORAL_CAPSULE | ORAL | Status: DC | PRN

## 2022-01-29 MED ORDER — OXYCODONE HCL 5 MG PO TABS
5.0000 mg | ORAL_TABLET | ORAL | Status: DC | PRN
  Administered 2022-01-29 – 2022-01-30 (×3): 5 mg via ORAL
  Filled 2022-01-29 (×3): qty 1

## 2022-01-29 MED ORDER — LORAZEPAM 0.5 MG PO TABS
0.5000 mg | ORAL_TABLET | Freq: Two times a day (BID) | ORAL | Status: DC
  Administered 2022-01-30: 05:00:00 0.5 mg via ORAL
  Filled 2022-01-29: qty 1

## 2022-01-29 MED ORDER — SIMETHICONE 80 MG PO CHEW
80.0000 mg | CHEWABLE_TABLET | Freq: Four times a day (QID) | ORAL | Status: DC | PRN
  Filled 2022-01-29: qty 1

## 2022-01-29 MED ORDER — FOLIC ACID 1 MG PO TABS
1.0000 mg | ORAL_TABLET | Freq: Every day | ORAL | Status: DC
  Administered 2022-01-30: 1 mg via ORAL
  Filled 2022-01-29: qty 1

## 2022-01-29 MED ORDER — LISINOPRIL 10 MG PO TABS
10.0000 mg | ORAL_TABLET | Freq: Every day | ORAL | Status: DC
  Administered 2022-01-29: 21:00:00 10 mg via ORAL
  Filled 2022-01-29: qty 1

## 2022-01-29 MED ORDER — GABAPENTIN 100 MG PO CAPS
100.0000 mg | ORAL_CAPSULE | Freq: Every day | ORAL | Status: DC
  Administered 2022-01-29: 21:00:00 100 mg via ORAL
  Filled 2022-01-29: qty 1

## 2022-01-29 MED ORDER — ACETAMINOPHEN 325 MG PO TABS
650.0000 mg | ORAL_TABLET | Freq: Four times a day (QID) | ORAL | Status: DC | PRN
  Administered 2022-01-30: 650 mg via ORAL
  Filled 2022-01-29: qty 2

## 2022-01-29 MED ORDER — OXYBUTYNIN CHLORIDE 5 MG PO TABS
5.0000 mg | ORAL_TABLET | Freq: Two times a day (BID) | ORAL | Status: DC
  Administered 2022-01-29 – 2022-01-30 (×2): 5 mg via ORAL
  Filled 2022-01-29 (×2): qty 1

## 2022-01-29 MED ORDER — FENTANYL CITRATE PF 50 MCG/ML IJ SOSY
50.0000 ug | PREFILLED_SYRINGE | Freq: Once | INTRAMUSCULAR | Status: AC
Start: 2022-01-29 — End: 2022-01-29
  Administered 2022-01-29: 15:00:00 50 ug via INTRAVENOUS
  Filled 2022-01-29: qty 1

## 2022-01-29 MED ORDER — MELATONIN 5 MG PO TABS
10.0000 mg | ORAL_TABLET | Freq: Every day | ORAL | Status: DC
  Administered 2022-01-29: 21:00:00 10 mg via ORAL
  Filled 2022-01-29 (×2): qty 2

## 2022-01-29 MED ORDER — OXYCODONE-ACETAMINOPHEN 5-325 MG PO TABS
1.0000 | ORAL_TABLET | Freq: Four times a day (QID) | ORAL | Status: DC
  Administered 2022-01-29 – 2022-01-30 (×3): 1 via ORAL
  Filled 2022-01-29 (×3): qty 1

## 2022-01-29 MED ORDER — RISPERIDONE 1 MG PO TABS
2.0000 mg | ORAL_TABLET | Freq: Two times a day (BID) | ORAL | Status: DC
  Administered 2022-01-29 – 2022-01-30 (×2): 2 mg via ORAL
  Filled 2022-01-29 (×3): qty 2

## 2022-01-29 MED ORDER — PANCRELIPASE (LIP-PROT-AMYL) 12000-38000 UNITS PO CPEP
48000.0000 [IU] | ORAL_CAPSULE | Freq: Three times a day (TID) | ORAL | Status: DC
  Administered 2022-01-29 – 2022-01-30 (×3): 48000 [IU] via ORAL
  Filled 2022-01-29 (×3): qty 4

## 2022-01-29 MED ORDER — PANTOPRAZOLE SODIUM 40 MG PO TBEC
40.0000 mg | DELAYED_RELEASE_TABLET | Freq: Every day | ORAL | Status: DC
  Administered 2022-01-30: 40 mg via ORAL
  Filled 2022-01-29: qty 1

## 2022-01-29 MED ORDER — POTASSIUM CHLORIDE CRYS ER 20 MEQ PO TBCR
80.0000 meq | EXTENDED_RELEASE_TABLET | Freq: Once | ORAL | Status: AC
  Administered 2022-01-29: 80 meq via ORAL
  Filled 2022-01-29: qty 4

## 2022-01-29 MED ORDER — VENLAFAXINE HCL ER 75 MG PO CP24
150.0000 mg | ORAL_CAPSULE | Freq: Every day | ORAL | Status: DC
  Administered 2022-01-29: 150 mg via ORAL
  Filled 2022-01-29: qty 2

## 2022-01-29 NOTE — ED Triage Notes (Signed)
Pt BIB EMS from [facility] c/o unable to standup while at the dining area. LWN yesterday, unknown time. Fell yesterday, did not hit head. Denies injury. H/O Bipolar, depression, UTI.  When asked, pt states "im hurting all over". Pt currently ao x 3, disoriented to time. Mild slurring noted.   183/96 114 SPO2 93% RA CBG 140

## 2022-01-29 NOTE — Progress Notes (Signed)
Spoke with patient daughter on the phone. Daughter expressed concerns regarding her mothers pain medication and the amount she is receiving. Daughter would like to take with physician regarding adjusting patient medication.

## 2022-01-29 NOTE — ED Notes (Signed)
To CPOD. Molli Hazard RN made aware.

## 2022-01-29 NOTE — H&P (Signed)
History and Physical    Patient: Jacqueline Williams [id number] DOB: Dec 14, 1951 DOA: 01/29/2022 DOS: the patient was seen and examined on 01/29/2022 PCP: Virgie Dad, FNP  Patient coming from: SNF  Chief Complaint:  Chief Complaint  Patient presents with   Weakness   HPI: Jacqueline Williams is a 71 y.o. female with medical history significant of anxiety, back pain, bipolar disorder, depression, hypertension, hypothyroidism, neuropathy, osteomyelitis of the great toe to the left foot, history of UTIs, history of unspecified sepsis, vitamin B12 deficiency who is sent to the emergency department from [facility] due to altered mental status.  The patient stated that she has had back pain, needs and lower extremity tenderness since she had a fall on her knees yesterday.  She was disoriented earlier in the emergency department, but seems to be more oriented now.  She denied any prodromal symptoms before her fall and stated that he seems to be having an accident.  No fever, chills, sore throat, rhinorrhea, productive cough or dyspnea.  No chest pain, palpitations, diaphoresis, PND or pitting edema of the lower extremities.  No abdominal pain, emesis, constipation, diarrhea, melena or hematochezia.  No flank pain, dysuria, frequency or hematuria.  No polyuria, polydipsia, polyphagia or blurry vision.  ED: Initial vital signs were temperature 99.3 F, pulse 106, respirations 21, BP 164/79 mmHg O2 sat 93% on room air.  The patient was administer 80 mEq KCl orally.  Lab work: Urinalysis show rare bacteria, but was otherwise unremarkable.  Lactic acid was normal twice.  Procalcitonin was less than 0.1 ng/mL.  CBC was normal.  CMP showed a potassium 3.0, chloride 93 and CO2 33 mmol/L.  Glucose of 130 mg/dL, the rest of the CMP measurements are unremarkable.  Imaging: A portable 1 view chest radiograph did not show any acute cardiopulmonary disease.  LS spine showed multilevel DDD, but negative for lumbar fracture.   CT head did not show any evidence of acute intracranial abnormality.  CT cervical spine no fractures.  There was a one-point centimeter incidental left thyroid nodule.  Thyroid ultrasound recommended.  Review of Systems: As mentioned in the history of present illness. All other systems reviewed and are negative. Past Medical History:  Diagnosis Date   Anxiety    Back pain    Bipolar disorder ([place])    Depression    Hypertension    Hypothyroidism    Neuropathy    Osteomyelitis of great toe of left foot ([place]) 2021   Sepsis ([place])    Vitamin B12 deficiency    Past Surgical History:  Procedure Laterality Date   ABDOMINAL HYSTERECTOMY     AMPUTATION TOE Left 09/15/2020   Procedure: AMPUTATION TOE MPJ LEFT;  Surgeon: Samara Deist, DPM;  Location: [facility] ORS;  Service: Podiatry;  Laterality: Left;   AMPUTATION TOE Left 11/10/2020   Procedure: AMPUTATION TOE IPJ LEFT 2ND;  Surgeon: Samara Deist, DPM;  Location: [facility] ORS;  Service: Podiatry;  Laterality: Left;   CHOLECYSTECTOMY     COLONOSCOPY WITH PROPOFOL N/A 04/30/2017   Procedure: COLONOSCOPY WITH PROPOFOL;  Surgeon: Jonathon Bellows, MD;  Location: [facility] ENDOSCOPY;  Service: Endoscopy;  Laterality: N/A;   ENTEROSCOPY N/A 05/09/2017   Procedure: ENTEROSCOPY with deplopyement of capsule for small bowel;  Surgeon: Jonathon Bellows, MD;  Location: [facility] ENDOSCOPY;  Service: Endoscopy;  Laterality: N/A;   ESOPHAGOGASTRODUODENOSCOPY (EGD) WITH PROPOFOL N/A 05/03/2017   Procedure: ESOPHAGOGASTRODUODENOSCOPY (EGD) WITH PROPOFOL;  Surgeon: Jonathon Bellows, MD;  Location: [facility] ENDOSCOPY;  Service: Gastroenterology;  Laterality: N/A;   GASTRIC BYPASS     x2   GIVENS CAPSULE STUDY N/A 05/07/2017   Procedure: GIVENS CAPSULE STUDY;  Surgeon: Jonathon Bellows, MD;  Location: [facility] ENDOSCOPY;  Service: Endoscopy;  Laterality: N/A;   HERNIA REPAIR     LUMBAR FUSION     Social History:  reports that she has never smoked. She has never used smokeless tobacco. She reports that she  does not drink alcohol and does not use drugs.  Allergies  Allergen Reactions   Other Shortness Of Breath    caffergot   Penicillin G Sodium Anaphylaxis   Aspirin Other (See Comments)    S/p Gastric bypass   Ergotamine Tartrate Other (See Comments)   Ibuprofen Other (See Comments)    S/p gastric bypass   Ketorolac Tromethamine Nausea Only   Naproxen Nausea Only   Olanzapine Other (See Comments)   Oxaprozin Other (See Comments)   Penicillins    Seroquel [Quetiapine Fumarate] Other (See Comments)    hallucinations    Family History  Problem Relation Age of Onset   Lung cancer Father    Hypertension Neg Hx    Diabetes Neg Hx     Prior to Admission medications   Medication Sig Start Date End Date Taking? Authorizing Provider  acetaminophen (TYLENOL) 325 MG tablet Take 650 mg by mouth every 6 (six) hours as needed.    [provider]  calcium-vitamin D (OSCAL WITH D) 500-200 MG-UNIT tablet Take 1 tablet by mouth.    [provider]  cetirizine (ZYRTEC) 10 MG tablet Take 10 mg by mouth daily.    [provider]  CREON 24000-76000 units CPEP Take 48,000 Units by mouth 3 (three) times daily before meals.  04/28/17   [provider]  cyanocobalamin (,VITAMIN B-12,) 1000 MCG/ML injection Inject 1 mL into the skin every 14 (fourteen) days.  04/16/17   [provider]  DEPO-ESTRADIOL 5 MG/ML injection Inject 0.4 mg into the muscle every 28 (twenty-eight) days.  04/22/17   [provider]  ferrous gluconate (FERGON) 324 MG tablet Take 1 tablet (324 mg total) by mouth 3 (three) times daily with meals. Patient taking differently: Take 324 mg by mouth 2 (two) times daily with a meal.  05/05/17   Theodoro Grist, MD  fluticasone (FLONASE) 50 MCG/ACT nasal spray Place 1 spray into both nostrils daily as needed for allergies or rhinitis.    [provider]  furosemide (LASIX) 20 MG tablet 20 mg daily.  03/31/17   [provider]   gabapentin (NEURONTIN) 100 MG capsule Take 200 mg by mouth at bedtime.  02/25/17   [provider]  lactase (LACTAID) 3000 units tablet Take by mouth 3 (three) times daily with meals.    [provider]  levothyroxine (SYNTHROID, LEVOTHROID) 25 MCG tablet Take 25 mcg by mouth daily. 04/26/17   [provider]  Lido-Capsaicin-Men-Methyl Sal (1ST MEDX-PATCH/ LIDOCAINE) 4-0.374-05-11 % PTCH Apply 1 patch topically daily. 01/24/21   Marlana Salvage, PA  lisinopril (ZESTRIL) 10 MG tablet Take 10 mg by mouth daily.    [provider]  loperamide (IMODIUM) 2 MG capsule Take by mouth as needed for diarrhea or loose stools.    [provider]  LORazepam (ATIVAN) 1 MG tablet Take 0.5-1 mg by mouth 4 (four) times daily. 0.5 mg 0630,  1 mg 1100, 0.5 mg 1600, 1 mg 2030 04/05/17   [provider]  Melatonin 3 MG CAPS Take by mouth at  bedtime.     [provider]  oxybutynin (DITROPAN) 5 MG tablet Take 5 mg by mouth 2 (two) times daily.    [provider]  oxyCODONE-acetaminophen (PERCOCET) 5-325 MG tablet Take 1 tablet by mouth 4 (four) times daily. [phone]    [provider]  pantoprazole (PROTONIX) 40 MG tablet Take 40 mg by mouth daily.    [provider]  potassium chloride (MICRO-K) 10 MEQ CR capsule 10 mEq 2 (two) times daily.  04/14/17   [provider]  promethazine (PHENERGAN) 25 MG tablet Take 25 mg by mouth every 6 (six) hours as needed. 04/28/17   [provider]  risperidone (RISPERDAL) 4 MG tablet Take 2 mg by mouth 2 (two) times daily.  03/11/17   [provider]  simethicone (MYLICON) 80 MG chewable tablet Chew 80 mg by mouth every 6 (six) hours as needed for flatulence.    [provider]  traZODone (DESYREL) 100 MG tablet Take 50 mg by mouth at bedtime.  04/14/17   [provider]  venlafaxine XR (EFFEXOR-XR) 150 MG 24 hr capsule Take 150 mg by mouth at  bedtime.  02/25/17   [provider]    Physical Exam: Vitals:   01/29/22 1251 01/29/22 1300 01/29/22 1347 01/29/22 1419  BP: (!) 153/79 (!) 141/82 (!) 143/83 135/85  Pulse: 93 91 93 90  Resp: 18 15 (!) 26 19  Temp:   98.9 F (37.2 C) 99.7 F (37.6 C)  TempSrc:   Oral   SpO2: 95% 97% 95% 96%  Weight:      Height:       Physical Exam Constitutional:      Appearance: She is obese.  HENT:     Head: Normocephalic.     Mouth/Throat:     Mouth: Mucous membranes are moist.     Comments: Lips are dry. Eyes:     Pupils: Pupils are equal, round, and reactive to light.  Neck:     Vascular: No carotid bruit.  Cardiovascular:     Rate and Rhythm: Normal rate and regular rhythm.     Heart sounds: Normal heart sounds.  Pulmonary:     Effort: Pulmonary effort is normal.     Breath sounds: Normal breath sounds.  Abdominal:     General: Bowel sounds are normal. There is no distension.     Palpations: Abdomen is soft.     Tenderness: There is no abdominal tenderness.     Comments: obese  Musculoskeletal:        General: No swelling.     Cervical back: Neck supple.  Skin:    General: Skin is warm and dry.     Comments: Bilateral pretibial erythema  Neurological:     General: No focal deficit present.     Mental Status: She is alert and oriented to person, place, and time.     Motor: Weakness present.     Comments: Oriented x3 with some difficulty.    Data Reviewed:  Results are pending, will review when available.  Assessment and Plan: Principal Problem:   Generalized weakness In the setting of   Acute toxic encephalopathy Mental status seems to be returning to baseline. Work-up seems to be benign so far. However she continues to be very weak. Polypharmacy?  Med rec is pending. Continue IV fluids and electrolyte replacement. Continue chronic back pain management. Consult physical therapy in a.m. if no improvement.  Active Problems:   Hypokalemia Replacing  purulent.  Follow potassium level.    Hypothyroidism/Thyroid nodule Continue levothyroxine pending med rec. Thyroid nodule ultrasound ordered.    Class 2 obesity Lifestyle modifications. Follow-up with primary.    Hypertension Continue antihypertensives pending med rec. Monitor BP, heart rate and electrolytes.    Anxiety and depression Resume antidepressants after med rec done.   Advance Care Planning:   Full Code   Consults:   Family Communication:   Severity of Illness: The appropriate patient status for this patient is OBSERVATION. Observation status is judged to be reasonable and necessary in order to provide the required intensity of service to ensure the patient's safety. The patient's presenting symptoms, physical exam findings, and initial radiographic and laboratory data in the context of their medical condition is felt to place them at decreased risk for further clinical deterioration. Furthermore, it is anticipated that the patient will be medically stable for discharge from the hospital within 2 midnights of admission.   Author: Reubin Milan, MD 01/29/2022 2:23 PM  For on call review [email or web address].

## 2022-01-29 NOTE — ED Provider Notes (Signed)
[facility] Provider Note    Event Date/Time   First MD Initiated Contact with Patient 01/29/22 1012     (approximate)   History   Weakness   HPI  Jacqueline Williams is a 71 y.o. female with past medical history of chronic back pain on chronic oral opioids, anxiety, bipolar disorder on Depakote, HTN, hypothyroidism, osteomyelitis, scoliosis and some baseline stress urinary issues present at bedside who presents via EMS from nursing facility for evaluation of weakness.  Patient states she started feeling weak last night and fell on her knees.  She states her knees gave out from underneath her.  She did not hit her head or anywhere else and has no acute extremity pain.  She denies any change in her chronic low back pain.  She denies any headache, fevers, cough, vomiting, diarrhea, rash, chest pain, abdominal pain or other clear associated sick symptoms.  Per son at bedside no other recent falls or injuries or sick symptoms as far as he is aware.    Past Medical History:  Diagnosis Date   Anxiety    Back pain    Bipolar disorder (H[facility])    Depression    Hypertension    Hypothyroidism    Neuropathy    Osteomyelitis of great toe of left foot ([place]) 2021   Sepsis ([place])    Vitamin B12 deficiency      Physical Exam  Triage Vital Signs: ED Triage Vitals  Enc Vitals Group     BP 01/29/22 1018 (!) 164/79     Pulse Rate 01/29/22 1018 (!) 106     Resp 01/29/22 1018 (!) 21     Temp 01/29/22 1018 99.3 F (37.4 C)     Temp Source 01/29/22 1018 Oral     SpO2 01/29/22 1010 93 %     Weight 01/29/22 1014 230 lb (104.3 kg)     Height 01/29/22 1014 5\' 7"  (1.702 m)     Head Circumference --      Peak Flow --      Pain Score --      Pain Loc --      Pain Edu? --      Excl. in [place]? --     Most recent vital signs: Vitals:   01/29/22 1300 01/29/22 1347  BP: (!) 141/82 (!) 143/83  Pulse: 91   Resp: 15 (!) 26  Temp:    SpO2: 97%     General: Awake, no distress.   CV:  Good peripheral perfusion.  Slightly tachycardic. Resp:  Normal effort.  Slight tachypneic.  Clear bilaterally Abd:  No distention.  Soft throughout. Other:  Some mild tenderness of the L-spine without any tenderness over the C or T-spine.  Patient has symmetric strength in bilateral upper and lower extremities.  No pronator drift.  Cranial nerves II to XII appear grossly intact.   ED Results / Procedures / Treatments  Labs (all labs ordered are listed, but only abnormal results are displayed) Labs Reviewed  URINALYSIS, COMPLETE (UACMP) WITH MICROSCOPIC - Abnormal; Notable for the following components:      Result Value   Color, Urine STRAW (*)    APPearance HAZY (*)    Bacteria, UA RARE (*)    All other components within normal limits  COMPREHENSIVE METABOLIC PANEL - Abnormal; Notable for the following components:   Potassium 3.0 (*)    Chloride 93 (*)    CO2 33 (*)    Glucose, Bld 130 (*)  All other components within normal limits  APTT - Abnormal; Notable for the following components:   aPTT 20 (*)    All other components within normal limits  VALPROIC ACID LEVEL - Abnormal; Notable for the following components:   Valproic Acid Lvl 12 (*)    All other components within normal limits  RESP PANEL BY RT-PCR (FLU A&B, COVID) ARPGX2  CULTURE, BLOOD (ROUTINE X 2)  CULTURE, BLOOD (ROUTINE X 2)  LACTIC ACID, PLASMA  LACTIC ACID, PLASMA  PROTIME-INR  TSH  PROCALCITONIN  CBC WITH DIFFERENTIAL/PLATELET  AMMONIA  MAGNESIUM  CBC WITH DIFFERENTIAL/PLATELET  URINE DRUG SCREEN, QUALITATIVE ([facility] ONLY)  PHOSPHORUS     EKG   ECG remarkable sinus tachycardia with a ventricular rate of 105, normal axis, unremarkable intervals without clear evidence of acute ischemia or significant arrhythmia  RADIOLOGY  CT head interpreted by myself shows no large acute ischemia, hemorrhage, mass effect or edema or other clear acute process.  Also reviewed radiology interpretation and  agree with the findings of no acute process.  CT C-spine my interpretation shows no large fracture or traumatic listhesis.  I also reviewed radiology interpretation and agree with the findings including an additional notation on a 1.7 cm incidental left thyroid nodule with recommendation for nonemergent thyroid ultrasound follow-up.  Chest reviewed by myself shows no focal consoidation, effusion, edema, pneumothorax or other clear acute thoracic process. I also reviewed radiology interpretation and agree with findings described.  Plain film the L-spine which I interpreted shows no acute fracture dislocation.  Also reviewed radiology interpretation and agree with the findings of no acute process  PROCEDURES:  Critical Care performed: No  .1-3 Lead EKG Interpretation Performed by: Lucrezia Starch, MD Authorized by: Lucrezia Starch, MD     Interpretation: non-specific     ECG rate assessment: tachycardic     Rhythm: sinus tachycardia     Ectopy: none     Conduction: normal    The patient is on the cardiac monitor to evaluate for evidence of arrhythmia and/or significant heart rate changes.   MEDICATIONS ORDERED IN ED: Medications  enoxaparin (LOVENOX) injection 40 mg (has no administration in time range)  acetaminophen (TYLENOL) tablet 650 mg (has no administration in time range)    Or  acetaminophen (TYLENOL) suppository 650 mg (has no administration in time range)  ondansetron (ZOFRAN) tablet 4 mg (has no administration in time range)    Or  ondansetron (ZOFRAN) injection 4 mg (has no administration in time range)  potassium chloride SA (KLOR-CON M) CR tablet 80 mEq (80 mEq Oral Given 01/29/22 1258)     IMPRESSION / MDM / ASSESSMENT AND PLAN / ED COURSE  I reviewed the triage vital signs and the nursing notes.                              Differential diagnosis includes, but is not limited to, generalized weakness leading to ground-level fall onto her knees yesterday and  inability stand up today secondary to endocrine derangements, metabolic derangements, anemia, acute infectious process, arrhythmia with lower suspicion for CVA as there is no clear acute focal deficits.  Low suspicion for toxic ingestion although polypharmacy is also consideration.  Patient seems also on Ativan as needed for anxiety  ECG remarkable sinus tachycardia with a ventricular rate of 105, normal axis, unremarkable intervals without clear evidence of acute ischemia or significant arrhythmia  CT head interpreted by myself shows  no large acute ischemia, hemorrhage, mass effect or edema or other clear acute process.  Also reviewed radiology interpretation and agree with the findings of no acute process.  CT C-spine my interpretation shows no large fracture or traumatic listhesis.  I also reviewed radiology interpretation and agree with the findings including an additional notation on a 1.7 cm incidental left thyroid nodule with recommendation for nonemergent thyroid ultrasound follow-up.  Chest reviewed by myself shows no focal consoidation, effusion, edema, pneumothorax or other clear acute thoracic process. I also reviewed radiology interpretation and agree with findings described.  Plain film the L-spine which I interpreted shows no acute fracture dislocation.  Also reviewed radiology interpretation and agree with the findings of no acute process.  CMP remarkable for potassium of 3 without any other significant electrolyte or metabolic derangements.  CBC without leukocytosis or acute anemia.  COVID influenza PCR is negative.  UA without evidence of infection although some rare bacteria.  No blood.  Magnesium and ammonia are unremarkable.  Lactic acid nonelevated.  Procalcitonin undetectable and TSH is within normal limits.  Overall unclear etiology of patient's encephalopathy although given she is not at her baseline will admit to medicine service for further evaluation and management.  I  discussed this with son at bedside who is amenable with plan and also with admitting hospitalist.      FINAL CLINICAL IMPRESSION(S) / ED DIAGNOSES   Final diagnoses:  Altered mental status, unspecified altered mental status type  Hypokalemia     Rx / DC Orders   ED Discharge Orders     None        Note:  This document was prepared using Dragon voice recognition software and may include unintentional dictation errors.   Lucrezia Starch, MD 01/29/22 [phone]

## 2022-01-30 DIAGNOSIS — F419 Anxiety disorder, unspecified: Secondary | ICD-10-CM

## 2022-01-30 DIAGNOSIS — F32A Depression, unspecified: Secondary | ICD-10-CM | POA: Diagnosis not present

## 2022-01-30 DIAGNOSIS — G9340 Encephalopathy, unspecified: Secondary | ICD-10-CM | POA: Diagnosis not present

## 2022-01-30 LAB — HIV ANTIBODY (ROUTINE TESTING W REFLEX): HIV Screen 4th Generation wRfx: NONREACTIVE

## 2022-01-30 MED ORDER — LORAZEPAM 1 MG PO TABS: 0.5000 mg | ORAL_TABLET | Freq: Two times a day (BID) | ORAL | Status: DC | PRN

## 2022-01-30 MED ORDER — ENOXAPARIN SODIUM 60 MG/0.6ML IJ SOSY: 0.5000 mg/kg | PREFILLED_SYRINGE | INTRAMUSCULAR | Status: DC

## 2022-01-30 MED ORDER — OXYCODONE-ACETAMINOPHEN 5-325 MG PO TABS: 1.0000 | ORAL_TABLET | Freq: Four times a day (QID) | ORAL | Status: DC | PRN

## 2022-01-30 MED ORDER — LIDOCAINE 5 % EX PTCH
2.0000 | MEDICATED_PATCH | CUTANEOUS | Status: DC
  Administered 2022-01-30: 04:00:00 2 via TRANSDERMAL
  Filled 2022-01-30: qty 2

## 2022-01-30 MED ORDER — FERROUS GLUCONATE 324 (38 FE) MG PO TABS: 324.0000 mg | ORAL_TABLET | Freq: Two times a day (BID) | ORAL | Status: DC

## 2022-01-30 NOTE — TOC Progression Note (Signed)
Transition of Care [facility]) - Progression Note    Patient Details  Name: TERRIA DESCHEPPER MRN: [id number] Date of Birth: 05-30-1951  Transition of Care [facility]) CM/SW Contact  Caryn Section, RN Phone Number: 01/30/2022, 11:30 AM  Clinical Narrative:   SNF recommended for patient by PT, however patient refuses SNF.  Patient would like to return to [facility] with therapy.  [facility] notified, and they verified they can serve her for PT OT at [facility].  Patient in agreement.    Expected Discharge Plan:  [facility]) Barriers to Discharge: Continued Medical Work up  Expected Discharge Plan and Services Expected Discharge Plan:  [facility])     Post Acute Care Choice:  [facility]) Living arrangements for the past 2 months: Assisted Living Facility                                       Social Determinants of Health (SDOH) Interventions    Readmission Risk Interventions No flowsheet data found.

## 2022-01-30 NOTE — Progress Notes (Signed)
Physical Therapy Treatment Patient Details Name: Jacqueline Williams MRN: [id number] DOB: Sep 23, 1951 Today's Date: 01/30/2022   History of Present Illness Patient is a 71 year old female who reports to [facility] for acute encephalopathy following reports to weakness per EMS. PMH(+) for chronic back pain on chronic oral opioids, anxiety, bipolar disorder on Depakote, HTN, hypothyroidism, osteomyelitis, scoliosis and some baseline stress urinary issues    PT Comments    Patient tolerated session well. Patient is much better spirits during PM session. Upon entering patient was sitting in recliner with RN and nursing student present. Session focused on patient/ family educated on proper RW height. Both patient and daughter verbalized understanding. Patient required CGA in static standing with intermittent unilateral UE support from RW handle bar. Continuous verbal cueing on upright posture. No LOB noted. Patient would continue to benefit from skilled physical therapy in order to optimize patient's return to PLOF. Continue to recommend STR upon discharge from acute hospitalization.    Recommendations for follow up therapy are one component of a multi-disciplinary discharge planning process, led by the attending physician.  Recommendations may be updated based on patient status, additional functional criteria and insurance authorization.  Follow Up Recommendations  Skilled nursing-short term rehab (<3 hours/day)     Assistance Recommended at Discharge Intermittent Supervision/Assistance  Patient can return home with the following Two people to help with walking and/or transfers;A lot of help with bathing/dressing/bathroom;Assistance with cooking/housework;Direct supervision/assist for medications management;Assist for transportation;Direct supervision/assist for financial management;Assistance with feeding;Two people to help with bathing/dressing/bathroom;A lot of help with walking and/or transfers   Equipment  Recommendations  Rolling walker (2 wheels)    Recommendations for Other Services       Precautions / Restrictions       Mobility  Bed Mobility Overal bed mobility: Needs Assistance Bed Mobility: Supine to Sit, Sit to Supine     Supine to sit: Min guard (R HHA to assist into sitting) Sit to supine: Max assist, +2 for physical assistance, HOB elevated (patient reports "just being to dizzy" to get back into bed. That she did not get "a lick" of sleep last night)     Patient Response: Flat affect  Transfers Overall transfer level: Needs assistance Equipment used: Rolling walker (2 wheels) Transfers: Sit to/from Stand Sit to Stand: Mod assist, Min guard           General transfer comment: First sit to stand attempt was compelted CGA, upon standing increased arm shaking bilaterally and noted moderate unsteadiness, patient quickly asked to sit back down. Upon secnod attempt at standing to have RN student clean patient, Mod A was required as patient reported increased dizziness.    Ambulation/Gait Ambulation/Gait assistance:  (deferred due to safety)                 Stairs             Wheelchair Mobility    Modified Rankin (Stroke Patients Only)       Balance Overall balance assessment: Needs assistance Sitting-balance support: Bilateral upper extremity supported, Feet supported Sitting balance-Leahy Scale: Fair   Postural control: Right lateral lean, Posterior lean Standing balance support: Bilateral upper extremity supported, During functional activity, Reliant on assistive device for balance Standing balance-Leahy Scale: Poor Standing balance comment: significant arm shaking noted with static standing; unable to tolerated >30 seconds in standing before requesting to sit back down  Cognition Arousal/Alertness: Awake/alert Behavior During Therapy: Flat affect, WFL for tasks assessed/performed (Patient highly  unmotivated) Overall Cognitive Status: Within Functional Limits for tasks assessed                                 General Comments: A&Ox4 to siutation, location, year, and self        Exercises Other Exercises Other Exercises: Patient and daughter educated on proper height on Rollator Other Exercises: Rollator height adjusted, patient required CGA assist in static standing with intermittent unilateral UUE support to adjust hand rest height (RN adjusted height), continuous verbal cueing for upright posture as patient had significant flexed posture in standing    General Comments        Pertinent Vitals/Pain Pain Assessment Pain Assessment: 0-10 Pain Score: 7  Pain Location: Low back and bilateral legs Pain Descriptors / Indicators: Sharp, Shooting, Nagging, Constant Pain Intervention(s): Limited activity within patient's tolerance, Monitored during session, Repositioned    Home Living                          Prior Function            PT Goals (current goals can now be found in the care plan section) Acute Rehab PT Goals Patient Stated Goal: to not be in pain and to go home PT Goal Formulation: With patient Time For Goal Achievement: 02/13/22 Potential to Achieve Goals: Good Progress towards PT goals: Progressing toward goals    Frequency    Min 2X/week      PT Plan      Co-evaluation              AM-PAC PT "6 Clicks" Mobility   Outcome Measure  Help needed turning from your back to your side while in a flat bed without using bedrails?: A Little Help needed moving from lying on your back to sitting on the side of a flat bed without using bedrails?: A Little Help needed moving to and from a bed to a chair (including a wheelchair)?: A Lot Help needed standing up from a chair using your arms (e.g., wheelchair or bedside chair)?: A Lot Help needed to walk in hospital room?: A Lot Help needed climbing 3-5 steps with a railing? : A  Lot 6 Click Score: 14    End of Session   Activity Tolerance: Patient tolerated treatment well Patient left:  (in room with sister, RN, and nursing student) Nurse Communication: Mobility status PT Visit Diagnosis: Unsteadiness on feet (R26.81);Muscle weakness (generalized) (M62.81);Difficulty in walking, not elsewhere classified (R26.2)     Time: 6761-9509 PT Time Calculation (min) (ACUTE ONLY): 9 min  Charges:  $Therapeutic Activity: 8-22 mins                     Angelica Ran, PT  01/30/22. 2:44 PM

## 2022-01-30 NOTE — Progress Notes (Signed)
PHARMACIST - PHYSICIAN COMMUNICATION  CONCERNING:  Enoxaparin (Lovenox) for DVT Prophylaxis    RECOMMENDATION: Patient was prescribed enoxaprin 40mg  q24 hours for VTE prophylaxis.   Filed Weights   01/29/22 1014  Weight: 104.3 kg (230 lb)    Body mass index is 36.02 kg/m.  Estimated Creatinine Clearance: 72.7 mL/min (by C-G formula based on SCr of 0.92 mg/dL).   Based on [facility] policy patient is candidate for enoxaparin 0.5mg /kg TBW SQ every 24 hours based on BMI being >30.   DESCRIPTION: Pharmacy has adjusted enoxaparin dose per [facility] policy.  Patient is now receiving enoxaparin 52.5 mg every 24 hours    [facility], PharmD Clinical Pharmacist  01/30/2022 9:27 AM

## 2022-01-30 NOTE — Discharge Summary (Addendum)
Physician Discharge Summary   Patient: Jacqueline Williams MRN: [id number] DOB: Nov 04, 1951  Admit date:     01/29/2022  Discharge date: 01/30/22  Discharge Physician: Glade LloydKshitiz Lavar Rosenzweig   PCP: Melonie FloridaGregory, Traci, FNP   Recommendations at discharge:   Outpatient follow-up with PCP at earliest convenience within a week Outpatient follow-up with psychiatrist Comply with medications and follow-up Outpatient evaluation and followed by endocrinology Recommend that her current medications be reviewed by PCP/psychiatrist and see if any changes can be made as an outpatient.   Brief summary: 71 y.o. female with medical history significant of anxiety, back pain, bipolar disorder, depression, hypertension, hypothyroidism, neuropathy, osteomyelitis of the great toe to the left foot, history of UTIs, history of unspecified sepsis, vitamin B12 deficiency presented from independent living facility for altered mental status.  On presentation, her mental status had improved.  She was kept under observation and treated with IV fluids.  Chest x-ray was negative for infiltrates. LS spine showed multilevel DDD, but negative for lumbar fracture.  CT head did not show any evidence of acute intracranial abnormality.  CT cervical spine no fractures.  There was a one-point centimeter incidental left thyroid nodule.  During the hospitalization, her mental status has improved and is currently back to baseline.  PT/OT recommended SNF but patient refused and wants to be discharged back to independent living facility.  She will be discharged back today with close outpatient follow-up with PCP/psychiatry.  Discharge diagnosis and Hospital Course:  Acute toxic encephalopathy -Possibly from polypharmacy from multiple sedating medications.  Discussed with patient and also with daughter on phone in detail regarding the need to decrease/cut back some of these sedative medications.  Patient is adamant that she does not want anything changed.  She  will need to follow with her PCP/psychiatrist at earliest convenience. -Mental status has improved. -PT/OT recommended SNF but patient refused and wants to be discharged back to independent living facility.  She will be discharged back today with close outpatient follow-up with PCP/psychiatry. -I have switched her Ativan to twice a day as needed and oxycodone to 4 times a day as needed  Anxiety and depression -Plan as above.  Resume rest of her home medications.  Outpatient follow-up with psychiatry at earliest convenience  Hypokalemia -Continue replacement as an outpatient  Obesity -Outpatient follow-up  Hypertension -Continue home regimen  Generalized deconditioning -Overall condition is guarded to poor.  Recommend outpatient palliative care evaluation and follow-up.  Bilateral thyroid nodules -Outpatient follow-up with PCP.  Will need referral to endocrinology for possible biopsy.    Consultants: None Procedures performed: None Disposition:  Independent living facility with home health PT/OT Diet recommendation:  Discharge Diet Orders (From admission, onward)     Start     Ordered   01/30/22 0000  Diet - low sodium heart healthy        01/30/22 1144           Cardiac diet  DISCHARGE MEDICATION: Allergies as of 01/30/2022       Reactions   Other Shortness Of Breath   caffergot   Penicillin G Sodium Anaphylaxis   Aspirin Other (See Comments)   S/p Gastric bypass   Ergotamine Tartrate Other (See Comments)   Ibuprofen Other (See Comments)   S/p gastric bypass   Ketorolac Tromethamine Nausea Only   Naproxen Nausea Only   Nsaids    Other reaction(s): Other (See Comments) GI bleed   Olanzapine Other (See Comments)   Oxaprozin Other (See Comments)   Penicillins  Seroquel [quetiapine Fumarate] Other (See Comments)   hallucinations        Medication List     STOP taking these medications    calcium-vitamin D 500-200 MG-UNIT tablet Commonly known as:  OSCAL WITH D       TAKE these medications    1st Medx-Patch/ Lidocaine 4-0.374-05-11 % Ptch Generic drug: Lido-Capsaicin-Men-Methyl Sal Apply 1 patch topically daily.   acetaminophen 325 MG tablet Commonly known as: TYLENOL Take 650 mg by mouth every 6 (six) hours as needed.   cetirizine 10 MG tablet Commonly known as: ZYRTEC Take 10 mg by mouth daily.   Creon 24000-76000 units Cpep Generic drug: Pancrelipase (Lip-Prot-Amyl) Take 48,000 Units by mouth 3 (three) times daily before meals.   cyanocobalamin 1000 MCG/ML injection Commonly known as: (VITAMIN B-12) Inject 1 mL into the skin every 14 (fourteen) days.   Depo-Estradiol 5 MG/ML injection Generic drug: estradiol cypionate Inject 0.4 mg into the muscle every 28 (twenty-eight) days.   divalproex 250 MG DR tablet Commonly known as: DEPAKOTE Take 250 mg by mouth 2 (two) times daily.   ferrous gluconate 324 MG tablet Commonly known as: FERGON Take 1 tablet (324 mg total) by mouth 2 (two) times daily with a meal.   fluticasone 50 MCG/ACT nasal spray Commonly known as: FLONASE Place 1 spray into both nostrils daily as needed for allergies or rhinitis.   folic acid 1 MG tablet Commonly known as: FOLVITE Take 1 mg by mouth daily.   furosemide 20 MG tablet Commonly known as: LASIX Take 20 mg by mouth daily.   gabapentin 100 MG capsule Commonly known as: NEURONTIN Take 100 mg by mouth at bedtime.   lactase 3000 units tablet Commonly known as: LACTAID Take 3,000 Units by mouth 3 (three) times daily with meals.   levothyroxine 25 MCG tablet Commonly known as: SYNTHROID Take 25 mcg by mouth daily.   lisinopril 10 MG tablet Commonly known as: ZESTRIL Take 10 mg by mouth daily.   loperamide 2 MG capsule Commonly known as: IMODIUM Take 2 mg by mouth as needed for diarrhea or loose stools.   LORazepam 1 MG tablet Commonly known as: ATIVAN Take 0.5 tablets (0.5 mg total) by mouth 2 (two) times daily as  needed for anxiety. What changed:  how much to take when to take this reasons to take this additional instructions   oxybutynin 5 MG tablet Commonly known as: DITROPAN Take 5 mg by mouth 2 (two) times daily.   oxyCODONE-acetaminophen 5-325 MG tablet Commonly known as: PERCOCET/ROXICET Take 1 tablet by mouth every 6 (six) hours as needed for severe pain. 0630,1100,1600,2030 What changed:  how much to take when to take this reasons to take this   pantoprazole 40 MG tablet Commonly known as: PROTONIX Take 40 mg by mouth daily.   potassium chloride 10 MEQ CR capsule Commonly known as: MICRO-K Take 10 mEq by mouth 2 (two) times daily.   promethazine 25 MG tablet Commonly known as: PHENERGAN Take 25 mg by mouth every 6 (six) hours as needed.   RA Melatonin 10 MG Tabs Generic drug: Melatonin Take 10 tablets by mouth at bedtime. What changed: Another medication with the same name was removed. Continue taking this medication, and follow the directions you see here.   risperiDONE 2 MG tablet Commonly known as: RISPERDAL Take 2 mg by mouth 2 (two) times daily.   simethicone 80 MG chewable tablet Commonly known as: MYLICON Chew 80 mg by mouth every 6 (six) hours  as needed for flatulence.   traZODone 100 MG tablet Commonly known as: DESYREL Take 50 mg by mouth at bedtime.   venlafaxine XR 150 MG 24 hr capsule Commonly known as: EFFEXOR-XR Take 150 mg by mouth at bedtime.               Durable Medical Equipment  (From admission, onward)           Start     Ordered   01/30/22 1133  For home use only DME Walker rolling  Once       Question Answer Comment  Walker: With 5 Inch Wheels   Patient needs a walker to treat with the following condition Impaired ambulation      01/30/22 1133            Follow-up Information     Melonie FloridaGregory, Traci, FNP. Schedule an appointment as soon as possible for a visit in 1 week(s).   Specialty: Family Medicine Contact  information: [address] [place] [id number] [phone]         psychiatrist. Schedule an appointment as soon as possible for a visit in 1 week(s).                Subjective: Patient seen and examined at bedside.  Awake and feels okay to be discharged today.  Adamant that she does not want her medications changed.  No overnight fever, seizures or vomiting reported.  Discharge Exam: Filed Weights   01/29/22 1014  Weight: 104.3 kg   General: Looks chronically ill and deconditioned.  Currently on room air. Respiratory: Bilateral decreased breath sounds at bases with some scattered crackles Cardiovascular: S1-S2 heard, rate controlled Abdomen: Obese, soft, nontender, bowel sounds positive Extremities: Mild lower extremity edema present; no clubbing  Condition at discharge: stable  The results of significant diagnostics from this hospitalization (including imaging, microbiology, ancillary and laboratory) are listed below for reference.   Imaging Studies: DG Lumbar Spine Complete  Result Date: 01/29/2022 CLINICAL DATA:  Fall yesterday. EXAM: LUMBAR SPINE - COMPLETE 4+ VIEW COMPARISON:  01/24/2021 FINDINGS: Mild anterolisthesis L2-3 and L3-4 unchanged. Pedicle screw and interbody fusion L5-S1 unchanged. Anterolisthesis L5-S1 unchanged Negative for fracture.  No skeletal lesion. IMPRESSION: Negative for lumbar fracture. Electronically Signed   By: Marlan Palauharles  Clark M.D.   On: 01/29/2022 11:38   CT HEAD WO CONTRAST (5MM)  Result Date: 01/29/2022 CLINICAL DATA:  Neck trauma (Age >= 65y); Mental status change, unknown cause. Unable to stand. Fall yesterday. Slurred speech. EXAM: CT HEAD WITHOUT CONTRAST CT CERVICAL SPINE WITHOUT CONTRAST TECHNIQUE: Multidetector CT imaging of the head and cervical spine was performed following the standard protocol without intravenous contrast. Multiplanar CT image reconstructions of the cervical spine were also generated. RADIATION  DOSE REDUCTION: This exam was performed according to the departmental dose-optimization program which includes automated exposure control, adjustment of the mA and/or kV according to patient size and/or use of iterative reconstruction technique. COMPARISON:  Head CT 01/24/2021.  Head MRI 09/26/2017. FINDINGS: CT HEAD FINDINGS Brain: There is no evidence of an acute infarct, intracranial hemorrhage, mass, midline shift, or extra-axial fluid collection. Mild chronic enlargement of the lateral ventricles is unchanged from the prior CT and favored to reflect central predominant cerebral atrophy over hydrocephalus. Vascular: No hyperdense vessel. Skull: No acute fracture or suspicious osseous lesion. Sinuses/Orbits: Visualized paranasal sinuses are clear. Trace left mastoid effusion. Unremarkable orbits. Other: None. CT CERVICAL SPINE FINDINGS Alignment: Straightening of the normal cervical lordosis. Mild  right convex curvature of the cervical spine. No evidence of traumatic subluxation. Skull base and vertebrae: No acute fracture or suspicious osseous lesion. Tiny bony defect in the right C1 transverse foramen has a chronic or developmental appearance. Soft tissues and spinal canal: No prevertebral fluid or swelling. No visible canal hematoma. Disc levels: Moderate disc degeneration at C5-6 and C6-7 with uncovertebral spurring resulting in neural foraminal stenosis which is mild-to-moderate on the left at C6-7. Moderate-sized central disc extrusion at C4-5 resulting in mild-to-moderate spinal stenosis. Upper chest: Clear lung apices. Partially visualized patulous esophagus containing fluid which may reflect dysmotility or reflux. Other: 1.7 cm peripherally calcified left thyroid nodule. IMPRESSION: 1. No evidence of acute intracranial abnormality. 2. No acute cervical spine fracture. 3. 1.7 cm incidental left thyroid nodule. Recommend thyroid US. Reference: J Am Coll Radiol. 2015 Feb;12(2): 143-50 Electronically Signed    By: Sebastian Ache M.D.   On: 01/29/2022 11:11   CT Cervical Spine Wo Contrast  Result Date: 01/29/2022 CLINICAL DATA:  Neck trauma (Age >= 65y); Mental status change, unknown cause. Unable to stand. Fall yesterday. Slurred speech. EXAM: CT HEAD WITHOUT CONTRAST CT CERVICAL SPINE WITHOUT CONTRAST TECHNIQUE: Multidetector CT imaging of the head and cervical spine was performed following the standard protocol without intravenous contrast. Multiplanar CT image reconstructions of the cervical spine were also generated. RADIATION DOSE REDUCTION: This exam was performed according to the departmental dose-optimization program which includes automated exposure control, adjustment of the mA and/or kV according to patient size and/or use of iterative reconstruction technique. COMPARISON:  Head CT 01/24/2021.  Head MRI 09/26/2017. FINDINGS: CT HEAD FINDINGS Brain: There is no evidence of an acute infarct, intracranial hemorrhage, mass, midline shift, or extra-axial fluid collection. Mild chronic enlargement of the lateral ventricles is unchanged from the prior CT and favored to reflect central predominant cerebral atrophy over hydrocephalus. Vascular: No hyperdense vessel. Skull: No acute fracture or suspicious osseous lesion. Sinuses/Orbits: Visualized paranasal sinuses are clear. Trace left mastoid effusion. Unremarkable orbits. Other: None. CT CERVICAL SPINE FINDINGS Alignment: Straightening of the normal cervical lordosis. Mild right convex curvature of the cervical spine. No evidence of traumatic subluxation. Skull base and vertebrae: No acute fracture or suspicious osseous lesion. Tiny bony defect in the right C1 transverse foramen has a chronic or developmental appearance. Soft tissues and spinal canal: No prevertebral fluid or swelling. No visible canal hematoma. Disc levels: Moderate disc degeneration at C5-6 and C6-7 with uncovertebral spurring resulting in neural foraminal stenosis which is mild-to-moderate on the  left at C6-7. Moderate-sized central disc extrusion at C4-5 resulting in mild-to-moderate spinal stenosis. Upper chest: Clear lung apices. Partially visualized patulous esophagus containing fluid which may reflect dysmotility or reflux. Other: 1.7 cm peripherally calcified left thyroid nodule. IMPRESSION: 1. No evidence of acute intracranial abnormality. 2. No acute cervical spine fracture. 3. 1.7 cm incidental left thyroid nodule. Recommend thyroid US. Reference: J Am Coll Radiol. 2015 Feb;12(2): 143-50 Electronically Signed   By: Sebastian Ache M.D.   On: 01/29/2022 11:11   DG Chest Portable 1 View  Result Date: 01/29/2022 CLINICAL DATA:  tachypnea, tachycardia EXAM: PORTABLE CHEST 1 VIEW COMPARISON:  January 24, 2021. FINDINGS: No consolidation. No visible pleural effusions or pneumothorax. Unchanged cardiomediastinal silhouette. IMPRESSION: No evidence of acute cardiopulmonary disease. Electronically Signed   By: Feliberto Harts M.D.   On: 01/29/2022 11:38   US THYROID  Result Date: 01/30/2022 CLINICAL DATA:  Thyroid nodule on CT. EXAM: THYROID ULTRASOUND TECHNIQUE: Ultrasound examination of the thyroid  gland and adjacent soft tissues was performed. COMPARISON:  Cervical spine CT 01/29/2022 FINDINGS: Parenchymal Echotexture: Mildly heterogenous Isthmus: 0.4 cm Right lobe: 5.1 x 1.9 x 1.5 cm Left lobe: 4.6 x 1.9 x 1.8 cm _________________________________________________________ Estimated total number of nodules >/= 1 cm: 1 Number of spongiform nodules >/=  2 cm not described below (TR1): 0 Number of mixed cystic and solid nodules >/= 1.5 cm not described below (TR2): 0 _________________________________________________________ Nodule #1 in the right mid thyroid lobe is a hypoechoic nodule that measures 0.7 x 0.4 x 0.6 cm. This does not meet criteria for biopsy or dedicated follow-up. Nodule #2 in the right mid thyroid lobe is a predominantly isoechoic nodule that measures 0.9 x 0.5 x 0.8 cm. This nodule does  not meet criteria for biopsy or dedicated follow-up. Nodule # 3: Location: Left; Inferior Maximum size: 1.6 cm; Other 2 dimensions: 1.4 x 1.4 cm Composition: cannot determine (2) Echogenicity: cannot determine (1) Shape: not taller-than-wide (0) Margins: ill-defined (0) Echogenic foci: peripheral calcifications (2) ACR TI-RADS total points: 5. ACR TI-RADS risk category: TR4 (4-6 points). ACR TI-RADS recommendations: **Given size (>/= 1.5 cm) and appearance, fine needle aspiration of this moderately suspicious nodule should be considered based on TI-RADS criteria. _________________________________________________________ IMPRESSION: 1. Bilateral thyroid nodules. 2. Dominant nodule (Nodule #3) is located in the left inferior thyroid lobe. This nodule has peripheral calcifications and measures up to 1.6 cm. This is a TR 4 nodule and meets criteria for ultrasound-guided biopsy. The above is in keeping with the ACR TI-RADS recommendations - J Am Coll Radiol 2017;14:587-595. Electronically Signed   By: Richarda Overlie M.D.   On: 01/30/2022 08:03    Microbiology: Results for orders placed or performed during the hospital encounter of 01/29/22  Blood Culture (routine x 2)     Status: None (Preliminary result)   Collection Time: 01/29/22 10:27 AM   Specimen: BLOOD  Result Value Ref Range Status   Specimen Description BLOOD LEFT ANTECUBITAL  Final   Special Requests   Final    BOTTLES DRAWN AEROBIC AND ANAEROBIC Blood Culture adequate volume   Culture   Final    NO GROWTH < 24 HOURS Performed at [facility], [address], [place], [place] [address]    Report Status PENDING  Incomplete  Blood Culture (routine x 2)     Status: None (Preliminary result)   Collection Time: 01/29/22 10:27 AM   Specimen: BLOOD  Result Value Ref Range Status   Specimen Description BLOOD RIGHT ANTECUBITAL  Final   Special Requests   Final    BOTTLES DRAWN AEROBIC AND ANAEROBIC Blood Culture results may not be optimal due  to an inadequate volume of blood received in culture bottles   Culture   Final    NO GROWTH < 24 HOURS Performed at [facility], [address], [place], [place] [address]    Report Status PENDING  Incomplete  Resp Panel by RT-PCR (Flu A&B, Covid) Nasopharyngeal Swab     Status: None   Collection Time: 01/29/22 10:28 AM   Specimen: Nasopharyngeal Swab; Nasopharyngeal(NP) swabs in vial transport medium  Result Value Ref Range Status   SARS Coronavirus 2 by RT PCR NEGATIVE NEGATIVE Final    Comment: (NOTE) SARS-CoV-2 target nucleic acids are NOT DETECTED.  The SARS-CoV-2 RNA is generally detectable in upper respiratory specimens during the acute phase of infection. The lowest concentration of SARS-CoV-2 viral copies this assay can detect is 138 copies/mL. A negative result does  not preclude SARS-Cov-2 infection and should not be used as the sole basis for treatment or other patient management decisions. A negative result may occur with  improper specimen collection/handling, submission of specimen other than nasopharyngeal swab, presence of viral mutation(s) within the areas targeted by this assay, and inadequate number of viral copies(<138 copies/mL). A negative result must be combined with clinical observations, patient history, and epidemiological information. The expected result is Negative.  Fact Sheet for Patients:  BloggerCourse.com  Fact Sheet for Healthcare Providers:  SeriousBroker.it  This test is no t yet approved or cleared by the [place] FDA and  has been authorized for detection and/or diagnosis of SARS-CoV-2 by FDA under an Emergency Use Authorization (EUA). This EUA will remain  in effect (meaning this test can be used) for the duration of the COVID-19 declaration under Section 564(b)(1) of the Act, 21 U.S.C.section 360bbb-3(b)(1), unless the authorization is terminated  or revoked sooner.        Influenza A by PCR NEGATIVE NEGATIVE Final   Influenza B by PCR NEGATIVE NEGATIVE Final    Comment: (NOTE) The Xpert Xpress SARS-CoV-2/FLU/RSV plus assay is intended as an aid in the diagnosis of influenza from Nasopharyngeal swab specimens and should not be used as a sole basis for treatment. Nasal washings and aspirates are unacceptable for Xpert Xpress SARS-CoV-2/FLU/RSV testing.  Fact Sheet for Patients: BloggerCourse.com  Fact Sheet for Healthcare Providers: SeriousBroker.it  This test is not yet approved or cleared by the [place] FDA and has been authorized for detection and/or diagnosis of SARS-CoV-2 by FDA under an Emergency Use Authorization (EUA). This EUA will remain in effect (meaning this test can be used) for the duration of the COVID-19 declaration under Section 564(b)(1) of the Act, 21 U.S.C. section 360bbb-3(b)(1), unless the authorization is terminated or revoked.  Performed at [facility], [address] [place], [place] [address]     Labs: CBC: Recent Labs  Lab 01/29/22 1129  WBC 5.4  NEUTROABS 4.1  HGB 12.4  HCT 37.1  MCV 93.0  PLT 269   Basic Metabolic Panel: Recent Labs  Lab 01/29/22 1027 01/29/22 1257  NA 141  --   K 3.0*  --   CL 93*  --   CO2 33*  --   GLUCOSE 130*  --   BUN 15  --   CREATININE 0.92  --   CALCIUM 9.2  --   MG  --  1.9  PHOS 3.5  --    Liver Function Tests: Recent Labs  Lab 01/29/22 1027  AST 40  ALT 31  ALKPHOS 125  BILITOT 0.5  PROT 7.0  ALBUMIN 3.6   CBG: No results for input(s): GLUCAP in the last 168 hours.  Discharge time spent: greater than 30 minutes.  Signed: Glade Lloyd, MD [facility] 01/30/2022

## 2022-01-30 NOTE — Evaluation (Signed)
Physical Therapy Evaluation Patient Details Name: Jacqueline Williams MRN: [id number] DOB: 02-15-1951 Today's Date: 01/30/2022  History of Present Illness  Patient is a 71 year old female who reports to [facility] for acute encephalopathy following reports to weakness per EMS. PMH(+) for chronic back pain on chronic oral opioids, anxiety, bipolar disorder on Depakote, HTN, hypothyroidism, osteomyelitis, scoliosis and some baseline stress urinary issues   Clinical Impression  Patient evaluated on this date and was agreeable to treatment. Upon arrival patient was in bed with [facility] elevated finishing her breakfast. Patient reported sharp stabbing pain in low back and bilateral lower extremities at 7/10. Pain did not increase or decrease with functional activity. Session interrupted briefly when MD came in to discuss pain medication with patient. Per patient, she was Mod I with ambulation, utilizing a rollator, and has an aide who comes and helps with bathing due to her low back pain. Patient came [facility]. Throughout session, patient had a flat affect, and required constant encouragement to participate as she was highly unmotivated. Patient also continuously kept reporting increased dizziness as she did not get a lot of sleep last night. HR ranged from 86-93bpm throughout session, O2 >90% throughout session on room air, and BP sitting EOB was 120/8mmHg.  Patient was able to demonstrate CGA/ R HHA with supine to sit bed mobility (with HOB elevated). Increased dizziness upon sitting EOB, sitting balance was fair with BUE support and feet supported. BLE strength reported at at least 4/5. Patient completed 2 sit to stand trials (first attempt CGA, second attempt Mod A). Patient only able to tolerate <30 seconds in standing with moderate unsteadiness noted. Due to increased reported dizziness from lack of sleep per patient, patient required Mod A +2 (Nursing student) to complete sit to supine transfer. Patient left in  room with nursing student. Patient would continue to benefit from skilled physical therapy in order to optimize patient's return to PLOF. Recommend STR upon discharge from acute hospitalization.      Recommendations for follow up therapy are one component of a multi-disciplinary discharge planning process, led by the attending physician.  Recommendations may be updated based on patient status, additional functional criteria and insurance authorization.  Follow Up Recommendations Skilled nursing-short term rehab (<3 hours/day)    Assistance Recommended at Discharge Intermittent Supervision/Assistance  Patient can return home with the following  Two people to help with walking and/or transfers;A lot of help with bathing/dressing/bathroom;Assistance with cooking/housework;Direct supervision/assist for medications management;Assist for transportation;Direct supervision/assist for financial management;Assistance with feeding;Two people to help with bathing/dressing/bathroom;A lot of help with walking and/or transfers    Equipment Recommendations Rolling walker (2 wheels)  Recommendations for Other Services       Functional Status Assessment Patient has had a recent decline in their functional status and demonstrates the ability to make significant improvements in function in a reasonable and predictable amount of time.     Precautions / Restrictions Precautions Precautions: Fall Restrictions Weight Bearing Restrictions: No      Mobility  Bed Mobility Overal bed mobility: Needs Assistance Bed Mobility: Supine to Sit, Sit to Supine     Supine to sit: Min guard (R HHA to assist into sitting) Sit to supine: Max assist, +2 for physical assistance, HOB elevated (patient reports "just being to dizzy" to get back into bed. That she did not get "a lick" of sleep last night)     Patient Response: Flat affect  Transfers Overall transfer level: Needs assistance Equipment used: Rolling walker  (  2 wheels) Transfers: Sit to/from Stand Sit to Stand: Mod assist, Min guard           General transfer comment: First sit to stand attempt was compelted CGA, upon standing increased arm shaking bilaterally and noted moderate unsteadiness, patient quickly asked to sit back down. Upon secnod attempt at standing to have RN student clean patient, Mod A was required as patient reported increased dizziness.    Ambulation/Gait Ambulation/Gait assistance:  (deferred due to safety)                Stairs            Wheelchair Mobility    Modified Rankin (Stroke Patients Only)       Balance Overall balance assessment: Needs assistance Sitting-balance support: Bilateral upper extremity supported, Feet supported Sitting balance-Leahy Scale: Fair   Postural control: Right lateral lean, Posterior lean Standing balance support: Bilateral upper extremity supported, During functional activity, Reliant on assistive device for balance Standing balance-Leahy Scale: Poor Standing balance comment: significant arm shaking noted with static standing; unable to tolerated >30 seconds in standing before requesting to sit back down                             Pertinent Vitals/Pain Pain Assessment Pain Assessment: 0-10 Pain Score: 7  Pain Location: Low back and bilateral legs Pain Descriptors / Indicators: Sharp, Shooting, Nagging, Constant Pain Intervention(s): Limited activity within patient's tolerance, Monitored during session, Repositioned    Home Living Family/patient expects to be discharged to:: Skilled nursing facility [facility])                        Prior Function Prior Level of Function : Independent/Modified Independent             Mobility Comments: Ambulates with Rollator ADLs Comments: aide comes and gives patient baths due to back pain for about a year and a half     Hand Dominance   Dominant Hand: Right    Extremity/Trunk  Assessment   Upper Extremity Assessment Upper Extremity Assessment: Generalized weakness (at least 3+/5 bilaterally)    Lower Extremity Assessment Lower Extremity Assessment: Generalized weakness (at least 4/5 strength bilaterally (LLE>RLE), no increase in back pain when testing BLE strenghth)       Communication   Communication: No difficulties  Cognition Arousal/Alertness: Awake/alert Behavior During Therapy: Flat affect, WFL for tasks assessed/performed (Patient highly unmotivated) Overall Cognitive Status: Within Functional Limits for tasks assessed                                 General Comments: A&Ox4 to siutation, location, year, and self        General Comments General comments (skin integrity, edema, etc.): HR: 86-93bpm throughout session, o2 92%, BP taken EOB: 120/43mmHg    Exercises Other Exercises Other Exercises: Patient educated on role of PT, fall risk, and d/c plans   Assessment/Plan    PT Assessment Patient needs continued PT services  PT Problem List Decreased mobility;Decreased strength;Decreased safety awareness;Decreased range of motion;Decreased coordination;Decreased activity tolerance;Decreased balance;Pain       PT Treatment Interventions DME instruction;Therapeutic activities;Gait training;Therapeutic exercise;Balance training;Functional mobility training;Stair training;Neuromuscular re-education;Patient/family education    PT Goals (Current goals can be found in the Care Plan section)  Acute Rehab PT Goals Patient Stated Goal: to not be in pain  and to go home PT Goal Formulation: With patient Time For Goal Achievement: 02/13/22 Potential to Achieve Goals: Good    Frequency Min 2X/week     Co-evaluation               AM-PAC PT "6 Clicks" Mobility  Outcome Measure Help needed turning from your back to your side while in a flat bed without using bedrails?: A Little Help needed moving from lying on your back to sitting  on the side of a flat bed without using bedrails?: A Little Help needed moving to and from a bed to a chair (including a wheelchair)?: A Lot Help needed standing up from a chair using your arms (e.g., wheelchair or bedside chair)?: A Lot Help needed to walk in hospital room?: A Lot Help needed climbing 3-5 steps with a railing? : A Lot 6 Click Score: 14    End of Session Equipment Utilized During Treatment: Gait belt Activity Tolerance: Patient tolerated treatment well;Patient limited by lethargy;Patient limited by pain Patient left: in bed;with call bell/phone within reach;with bed alarm set Nurse Communication: Mobility status PT Visit Diagnosis: Unsteadiness on feet (R26.81);Muscle weakness (generalized) (M62.81);Difficulty in walking, not elsewhere classified (R26.2)    Time: 4403-4742 PT Time Calculation (min) (ACUTE ONLY): 33 min   Charges:   PT Evaluation $PT Eval Low Complexity: 1 Low PT Treatments $Therapeutic Activity: 8-22 mins        Angelica Ran, PT  01/30/22. 9:26 AM

## 2022-01-30 NOTE — Progress Notes (Signed)
[facility]) Hospital Liaison note:  Notified via Epic workque form Dr Glade Lloyd of request for [facility] services. Will continue to follow for disposition.  Please call with any outpatient palliative questions or concerns.  Thank you for the opportunity to participate in this patient's care.  Thank you, Abran Cantor, LPN [facility] Liaison [phone]

## 2022-01-30 NOTE — TOC Initial Note (Signed)
Transition of Care [facility]) - Initial/Assessment Note    Patient Details  Name: Jacqueline Williams MRN: [id number] Date of Birth: 10-14-1951  Transition of Care [facility]) CM/SW Contact:    Caryn Section, RN Phone Number: 01/30/2022, 10:20 AM  Clinical Narrative:   Patient lives at [facility], daughter has Life Alert for patient as well.  Patient has no concerns with medications or transportation and reports that she is up to date with PCP.  Patient verbalizes no needs at this time, but TOC contact information provided and TOC to follow in the event any needs arise.                  Expected Discharge Plan:  [facility]) Barriers to Discharge: Continued Medical Work up   Patient Goals and CMS Choice        Expected Discharge Plan and Services Expected Discharge Plan:  [facility])     Post Acute Care Choice:  [facility]) Living arrangements for the past 2 months: Assisted Living Facility                                      Prior Living Arrangements/Services Living arrangements for the past 2 months: Assisted Living Facility Lives with:: Self, Facility Resident Patient language and need for interpreter reviewed:: Yes (No inter preter required) Do you feel safe going back to the place where you live?: Yes      Need for Family Participation in Patient Care: Yes (Comment) Care giver support system in place?: Yes (comment)   Criminal Activity/Legal Involvement Pertinent to Current Situation/Hospitalization: No - Comment as needed  Activities of Daily Living Home Assistive Devices/Equipment: Walker (specify type), Dentures (specify type) ADL Screening (condition at time of admission) Patient's cognitive ability adequate to safely complete daily activities?: Yes Is the patient deaf or have difficulty hearing?: No Does the patient have difficulty seeing, even when wearing glasses/contacts?: No Does the patient have difficulty concentrating, remembering, or making  decisions?: Yes Patient able to express need for assistance with ADLs?: Yes Does the patient have difficulty dressing or bathing?: Yes Independently performs ADLs?: No Communication: Independent Dressing (OT): Needs assistance Grooming: Needs assistance Feeding: Independent Bathing: Needs assistance Toileting: Needs assistance In/Out Bed: Needs assistance Walks in Home: Needs assistance Does the patient have difficulty walking or climbing stairs?: Yes Weakness of Legs: Both Weakness of Arms/Hands: Both  Permission Sought/Granted Permission sought to share information with : Case Manager Permission granted to share information with : Yes, Verbal Permission Granted     Permission granted to share info w AGENCY: [facility]        Emotional Assessment Appearance:: Appears stated age Attitude/Demeanor/Rapport: Gracious, Engaged Affect (typically observed): Pleasant, Appropriate Orientation: : Oriented to Self, Oriented to  Time, Oriented to Place, Oriented to Situation Alcohol / Substance Use: Not Applicable Psych Involvement: No (comment)  Admission diagnosis:  Hypokalemia [E87.6] Acute encephalopathy [G93.40] Altered mental status, unspecified altered mental status type [R41.82] Patient Active Problem List   Diagnosis Date Noted   Acute encephalopathy 01/29/2022   Hypokalemia 01/29/2022   Hypothyroidism 01/29/2022   Class 2 obesity 01/29/2022   Hypertension 01/29/2022   Anxiety and depression 01/29/2022   Generalized weakness 01/29/2022   Thyroid nodule 01/29/2022   Chronic osteomyelitis of toe, 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/11/2020   Anemia    Acute posthemorrhagic anemia 05/05/2017   History of recent blood transfusion 05/05/2017  Hyponatremia 05/05/2017   Dehydration 05/05/2017   Acute renal insufficiency 05/05/2017   Leukocytosis 05/05/2017   Diarrhea 05/05/2017   GI bleed 04/28/2017   PCP:  Melonie Florida, FNP Pharmacy:   [facility] [id number] [place], [place] -  [address] [place] [address] Phone: [phone] Fax: [phone]     Social Determinants of Health (SDOH) Interventions    Readmission Risk Interventions No flowsheet data found.

## 2022-02-03 LAB — CULTURE, BLOOD (ROUTINE X 2)
Culture: NO GROWTH
Culture: NO GROWTH
Special Requests: ADEQUATE

## 2022-02-16 ENCOUNTER — Observation Stay: Payer: Medicare PPO

## 2022-02-16 ENCOUNTER — Observation Stay
Admission: EM | Admit: 2022-02-16 | Discharge: 2022-02-26 | Disposition: A | Discharge status: Skilled Nursing Facility | Payer: Medicare PPO | Attending: Student in an Organized Health Care Education/Training Program | Admitting: Student in an Organized Health Care Education/Training Program

## 2022-02-16 ENCOUNTER — Other Ambulatory Visit: Payer: Self-pay

## 2022-02-16 ENCOUNTER — Emergency Department: Payer: Medicare PPO

## 2022-02-16 DIAGNOSIS — Z20822 Contact with and (suspected) exposure to covid-19: Secondary | ICD-10-CM | POA: Insufficient documentation

## 2022-02-16 DIAGNOSIS — E041 Nontoxic single thyroid nodule: Secondary | ICD-10-CM

## 2022-02-16 DIAGNOSIS — Z79899 Other long term (current) drug therapy: Secondary | ICD-10-CM | POA: Insufficient documentation

## 2022-02-16 DIAGNOSIS — E669 Obesity, unspecified: Secondary | ICD-10-CM | POA: Diagnosis present

## 2022-02-16 DIAGNOSIS — M7989 Other specified soft tissue disorders: Secondary | ICD-10-CM

## 2022-02-16 DIAGNOSIS — R2242 Localized swelling, mass and lump, left lower limb: Secondary | ICD-10-CM | POA: Diagnosis not present

## 2022-02-16 DIAGNOSIS — R404 Transient alteration of awareness: Secondary | ICD-10-CM | POA: Diagnosis not present

## 2022-02-16 DIAGNOSIS — R531 Weakness: Secondary | ICD-10-CM | POA: Diagnosis present

## 2022-02-16 DIAGNOSIS — E871 Hypo-osmolality and hyponatremia: Secondary | ICD-10-CM | POA: Insufficient documentation

## 2022-02-16 DIAGNOSIS — R4182 Altered mental status, unspecified: Secondary | ICD-10-CM | POA: Diagnosis not present

## 2022-02-16 DIAGNOSIS — R410 Disorientation, unspecified: Secondary | ICD-10-CM

## 2022-02-16 DIAGNOSIS — D649 Anemia, unspecified: Secondary | ICD-10-CM

## 2022-02-16 DIAGNOSIS — G9341 Metabolic encephalopathy: Secondary | ICD-10-CM | POA: Diagnosis present

## 2022-02-16 DIAGNOSIS — E039 Hypothyroidism, unspecified: Secondary | ICD-10-CM | POA: Insufficient documentation

## 2022-02-16 DIAGNOSIS — F419 Anxiety disorder, unspecified: Secondary | ICD-10-CM | POA: Diagnosis present

## 2022-02-16 DIAGNOSIS — W06XXXA Fall from bed, initial encounter: Secondary | ICD-10-CM | POA: Diagnosis not present

## 2022-02-16 DIAGNOSIS — F32A Depression, unspecified: Secondary | ICD-10-CM | POA: Diagnosis present

## 2022-02-16 DIAGNOSIS — I10 Essential (primary) hypertension: Secondary | ICD-10-CM | POA: Insufficient documentation

## 2022-02-16 LAB — COMPREHENSIVE METABOLIC PANEL
ALT: 18 U/L (ref 0–44)
AST: 21 U/L (ref 15–41)
Albumin: 3.8 g/dL (ref 3.5–5.0)
Alkaline Phosphatase: 104 U/L (ref 38–126)
Anion gap: 11 (ref 5–15)
BUN: 19 mg/dL (ref 8–23)
CO2: 27 mmol/L (ref 22–32)
Calcium: 8.7 mg/dL — ABNORMAL LOW (ref 8.9–10.3)
Chloride: 89 mmol/L — ABNORMAL LOW (ref 98–111)
Creatinine, Ser: 1.08 mg/dL — ABNORMAL HIGH (ref 0.44–1.00)
GFR, Estimated: 56 mL/min — ABNORMAL LOW (ref >60)
Glucose, Bld: 181 mg/dL — ABNORMAL HIGH (ref 70–99)
Potassium: 3.7 mmol/L (ref 3.5–5.1)
Sodium: 127 mmol/L — ABNORMAL LOW (ref 135–145)
Total Bilirubin: 0.5 mg/dL (ref 0.3–1.2)
Total Protein: 6.7 g/dL (ref 6.5–8.1)

## 2022-02-16 LAB — URINALYSIS, ROUTINE W REFLEX MICROSCOPIC
Bilirubin Urine: NEGATIVE
Glucose, UA: NEGATIVE mg/dL
Hgb urine dipstick: NEGATIVE
Ketones, ur: NEGATIVE mg/dL
Leukocytes,Ua: NEGATIVE
Nitrite: NEGATIVE
Protein, ur: NEGATIVE mg/dL
Specific Gravity, Urine: 1.004 — ABNORMAL LOW (ref 1.005–1.030)
pH: 6 (ref 5.0–8.0)

## 2022-02-16 LAB — CBC WITH DIFFERENTIAL/PLATELET
Abs Immature Granulocytes: 0.03 10*3/uL (ref 0.00–0.07)
Basophils Absolute: 0 10*3/uL (ref 0.0–0.1)
Basophils Relative: 0 %
Eosinophils Absolute: 0 10*3/uL (ref 0.0–0.5)
Eosinophils Relative: 0 %
HCT: 38.4 % (ref 36.0–46.0)
Hemoglobin: 12.8 g/dL (ref 12.0–15.0)
Immature Granulocytes: 0 %
Lymphocytes Relative: 15 %
Lymphs Abs: 1.2 10*3/uL (ref 0.7–4.0)
MCH: 31.1 pg (ref 26.0–34.0)
MCHC: 33.3 g/dL (ref 30.0–36.0)
MCV: 93.4 fL (ref 80.0–100.0)
Monocytes Absolute: 0.9 10*3/uL (ref 0.1–1.0)
Monocytes Relative: 11 %
Neutro Abs: 6.1 10*3/uL (ref 1.7–7.7)
Neutrophils Relative %: 74 %
Platelets: 279 10*3/uL (ref 150–400)
RBC: 4.11 MIL/uL (ref 3.87–5.11)
RDW: 11.6 % (ref 11.5–15.5)
WBC: 8.2 10*3/uL (ref 4.0–10.5)
nRBC: 0 % (ref 0.0–0.2)

## 2022-02-16 LAB — LACTIC ACID, PLASMA
Lactic Acid, Venous: 2.6 mmol/L (ref 0.5–1.9)
Lactic Acid, Venous: 0.7 mmol/L (ref 0.5–1.9)

## 2022-02-16 LAB — RESP PANEL BY RT-PCR (FLU A&B, COVID) ARPGX2
Influenza A by PCR: NEGATIVE
Influenza B by PCR: NEGATIVE
SARS Coronavirus 2 by RT PCR: NEGATIVE

## 2022-02-16 LAB — MAGNESIUM: Magnesium: 2.4 mg/dL (ref 1.7–2.4)

## 2022-02-16 LAB — PHOSPHORUS: Phosphorus: 2.5 mg/dL (ref 2.5–4.6)

## 2022-02-16 MED ORDER — PANCRELIPASE (LIP-PROT-AMYL) 12000-38000 UNITS PO CPEP
36000.0000 [IU] | ORAL_CAPSULE | Freq: Three times a day (TID) | ORAL | Status: DC
  Administered 2022-02-17 – 2022-02-26 (×29): 36000 [IU] via ORAL
  Filled 2022-02-16: qty 1
  Filled 2022-02-16 (×6): qty 3
  Filled 2022-02-16: qty 1
  Filled 2022-02-16 (×6): qty 3
  Filled 2022-02-16: qty 1
  Filled 2022-02-16 (×3): qty 3
  Filled 2022-02-16: qty 1
  Filled 2022-02-16 (×11): qty 3

## 2022-02-16 MED ORDER — ACETAMINOPHEN 500 MG PO TABS
1000.0000 mg | ORAL_TABLET | Freq: Four times a day (QID) | ORAL | Status: AC | PRN
  Administered 2022-02-18 – 2022-02-19 (×2): 1000 mg via ORAL
  Filled 2022-02-16 (×2): qty 2

## 2022-02-16 MED ORDER — SIMETHICONE 80 MG PO CHEW
80.0000 mg | CHEWABLE_TABLET | Freq: Four times a day (QID) | ORAL | Status: DC | PRN
  Filled 2022-02-16 (×2): qty 1

## 2022-02-16 MED ORDER — ENOXAPARIN SODIUM 60 MG/0.6ML IJ SOSY
0.5000 mg/kg | PREFILLED_SYRINGE | INTRAMUSCULAR | Status: DC
  Administered 2022-02-17 – 2022-02-26 (×10): 52.5 mg via SUBCUTANEOUS
  Filled 2022-02-16 (×10): qty 0.6

## 2022-02-16 MED ORDER — PANTOPRAZOLE SODIUM 20 MG PO TBEC
20.0000 mg | DELAYED_RELEASE_TABLET | Freq: Every day | ORAL | Status: DC
  Administered 2022-02-17 – 2022-02-26 (×10): 20 mg via ORAL
  Filled 2022-02-16 (×10): qty 1

## 2022-02-16 MED ORDER — TRAZODONE HCL 50 MG PO TABS
50.0000 mg | ORAL_TABLET | Freq: Every day | ORAL | Status: DC
  Administered 2022-02-17 – 2022-02-21 (×6): 50 mg via ORAL
  Filled 2022-02-16 (×6): qty 1

## 2022-02-16 MED ORDER — LACTASE 3000 UNITS PO TABS
3000.0000 [IU] | ORAL_TABLET | Freq: Three times a day (TID) | ORAL | Status: DC
  Administered 2022-02-17 – 2022-02-26 (×29): 3000 [IU] via ORAL
  Filled 2022-02-16 (×32): qty 1

## 2022-02-16 MED ORDER — OXYCODONE-ACETAMINOPHEN 5-325 MG PO TABS
1.0000 | ORAL_TABLET | Freq: Four times a day (QID) | ORAL | Status: DC | PRN
  Administered 2022-02-16 – 2022-02-26 (×30): 1 via ORAL
  Filled 2022-02-16 (×30): qty 1

## 2022-02-16 MED ORDER — ENOXAPARIN SODIUM 40 MG/0.4ML IJ SOSY: 40.0000 mg | PREFILLED_SYRINGE | INTRAMUSCULAR | Status: DC

## 2022-02-16 MED ORDER — RISPERIDONE 1 MG PO TABS
2.0000 mg | ORAL_TABLET | Freq: Two times a day (BID) | ORAL | Status: DC
  Administered 2022-02-17 – 2022-02-26 (×20): 2 mg via ORAL
  Filled 2022-02-16 (×21): qty 2

## 2022-02-16 MED ORDER — OXYBUTYNIN CHLORIDE 5 MG PO TABS
5.0000 mg | ORAL_TABLET | Freq: Two times a day (BID) | ORAL | Status: DC
  Administered 2022-02-17 – 2022-02-22 (×11): 5 mg via ORAL
  Filled 2022-02-16 (×11): qty 1

## 2022-02-16 MED ORDER — LISINOPRIL 10 MG PO TABS
10.0000 mg | ORAL_TABLET | Freq: Every day | ORAL | Status: DC
  Administered 2022-02-17 – 2022-02-25 (×9): 10 mg via ORAL
  Filled 2022-02-16 (×9): qty 1

## 2022-02-16 MED ORDER — DIVALPROEX SODIUM 250 MG PO DR TAB
250.0000 mg | DELAYED_RELEASE_TABLET | Freq: Two times a day (BID) | ORAL | Status: DC
  Administered 2022-02-17 – 2022-02-26 (×20): 250 mg via ORAL
  Filled 2022-02-16 (×21): qty 1

## 2022-02-16 MED ORDER — LORAZEPAM 0.5 MG PO TABS
0.5000 mg | ORAL_TABLET | Freq: Two times a day (BID) | ORAL | Status: DC | PRN
  Administered 2022-02-19 – 2022-02-21 (×3): 0.5 mg via ORAL
  Filled 2022-02-16 (×3): qty 1

## 2022-02-16 MED ORDER — ONDANSETRON HCL 4 MG PO TABS: 4.0000 mg | ORAL_TABLET | Freq: Four times a day (QID) | ORAL | Status: DC | PRN

## 2022-02-16 MED ORDER — SODIUM CHLORIDE 0.9 % IV BOLUS
1000.0000 mL | Freq: Once | INTRAVENOUS | Status: AC
  Administered 2022-02-16: 1000 mL via INTRAVENOUS

## 2022-02-16 MED ORDER — LEVOTHYROXINE SODIUM 25 MCG PO TABS
25.0000 ug | ORAL_TABLET | Freq: Every day | ORAL | Status: DC
  Administered 2022-02-17 – 2022-02-26 (×10): 25 ug via ORAL
  Filled 2022-02-16 (×10): qty 1

## 2022-02-16 MED ORDER — ACETAMINOPHEN 650 MG RE SUPP: 650.0000 mg | Freq: Four times a day (QID) | RECTAL | Status: AC | PRN

## 2022-02-16 MED ORDER — ONDANSETRON HCL 4 MG/2ML IJ SOLN
4.0000 mg | Freq: Four times a day (QID) | INTRAMUSCULAR | Status: DC | PRN
  Administered 2022-02-22: 4 mg via INTRAVENOUS
  Filled 2022-02-16: qty 2

## 2022-02-16 MED ORDER — GABAPENTIN 100 MG PO CAPS
100.0000 mg | ORAL_CAPSULE | Freq: Every day | ORAL | Status: DC
  Administered 2022-02-17 – 2022-02-25 (×10): 100 mg via ORAL
  Filled 2022-02-16 (×10): qty 1

## 2022-02-16 MED ORDER — FLUTICASONE PROPIONATE 50 MCG/ACT NA SUSP
1.0000 | Freq: Every day | NASAL | Status: DC | PRN
  Filled 2022-02-16: qty 16

## 2022-02-16 MED ORDER — MELATONIN 5 MG PO TABS
5.0000 mg | ORAL_TABLET | Freq: Every day | ORAL | Status: DC
  Administered 2022-02-17 – 2022-02-25 (×10): 5 mg via ORAL
  Filled 2022-02-16 (×10): qty 1

## 2022-02-16 MED ORDER — VENLAFAXINE HCL ER 75 MG PO CP24
150.0000 mg | ORAL_CAPSULE | Freq: Every day | ORAL | Status: DC
  Administered 2022-02-17 – 2022-02-25 (×10): 150 mg via ORAL
  Filled 2022-02-16 (×3): qty 2
  Filled 2022-02-16: qty 1
  Filled 2022-02-16 (×3): qty 2
  Filled 2022-02-16: qty 1
  Filled 2022-02-16 (×3): qty 2

## 2022-02-16 NOTE — ED Notes (Signed)
Pt asked to speak to her daughter. Tech called daughter that was listed as her emergency contact. Pt was able to talk to daughter briefly on the phone with help of the tech.  Daughter asked if she could call back and get updates. I told her labs were just drawn and we are waiting on a room but yes she could call back and ask to speak to RN.  Pt had a sip of water. Pt calm and relaxed in waiting area.

## 2022-02-16 NOTE — ED Provider Notes (Signed)
[facility] Provider Note    Event Date/Time   First MD Initiated Contact with Patient 02/16/22 1508     (approximate)   History   Weakness   HPI  Jacqueline Williams is a 71 y.o. female   with a past history of obesity hypothyroidism and GI bleed is brought to the ED due to weakness, falling out of bed today.  Patient reports that she has been on Levaquin recently for a UTI and is feeling like her urinary symptoms are improving.  Denies any acute pain complaints.  Denies head injury or focal weakness or paresthesia, no vision changes.  Family member who is arrived to bedside notes that the patient has had constant confusion for the past 2 weeks.  He notes that last night she called him every 10 minutes since 4:00 AM, leaving a voicemail with the same message requesting some tea.  She seemed to have no recollection of her previous calls.  No fever vomiting diarrhea.  She is on Lasix.      Physical Exam   Triage Vital Signs: ED Triage Vitals  Enc Vitals Group     BP 02/16/22 1400 (!) 145/66     Pulse Rate 02/16/22 1400 78     Resp 02/16/22 1400 18     Temp 02/16/22 1425 98.7 F (37.1 C)     Temp Source 02/16/22 1425 Oral     SpO2 02/16/22 1400 96 %     Weight --      Height --      Head Circumference --      Peak Flow --      Pain Score --      Pain Loc --      Pain Edu? --      Excl. in GC? --     Most recent vital signs: Vitals:   02/16/22 1425 02/16/22 1510  BP:  (!) 162/68  Pulse:  97  Resp:  20  Temp: 98.7 F (37.1 C)   SpO2:  98%     General: Awake, no distress.  CV:  Good peripheral perfusion.  Resp:  Normal effort.  Abd:  No distention.  Other:  Dry mucous membranes.   ED Results / Procedures / Treatments   Labs (all labs ordered are listed, but only abnormal results are displayed) Labs Reviewed  LACTIC ACID, PLASMA - Abnormal; Notable for the following components:      Result Value   Lactic Acid, Venous 2.6 (*)     All other components within normal limits  COMPREHENSIVE METABOLIC PANEL - Abnormal; Notable for the following components:   Sodium 127 (*)    Chloride 89 (*)    Glucose, Bld 181 (*)    Creatinine, Ser 1.08 (*)    Calcium 8.7 (*)    GFR, Estimated 56 (*)    All other components within normal limits  URINALYSIS, ROUTINE W REFLEX MICROSCOPIC - Abnormal; Notable for the following components:   Color, Urine YELLOW (*)    APPearance HAZY (*)    Specific Gravity, Urine 1.004 (*)    Bacteria, UA MANY (*)    All other components within normal limits  RESP PANEL BY RT-PCR (FLU A&B, COVID) ARPGX2  CBC WITH DIFFERENTIAL/PLATELET  BASIC METABOLIC PANEL  CBC  PHOSPHORUS  MAGNESIUM  VITAMIN B12     EKG    RADIOLOGY Chest x-ray viewed and interpreted by me, appears unremarkable.  Radiology report reviewed.    PROCEDURES:  Critical Care performed: No  Procedures   MEDICATIONS ORDERED IN ED: Medications  LORazepam (ATIVAN) tablet 0.5 mg (has no administration in time range)  lisinopril (ZESTRIL) tablet 10 mg (has no administration in time range)  risperiDONE (RISPERDAL) tablet 2 mg (has no administration in time range)  traZODone (DESYREL) tablet 50 mg (has no administration in time range)  venlafaxine XR (EFFEXOR-XR) 24 hr capsule 150 mg (has no administration in time range)  levothyroxine (SYNTHROID) tablet 25 mcg (has no administration in time range)  pantoprazole (PROTONIX) EC tablet 20 mg (has no administration in time range)  oxybutynin (DITROPAN) tablet 5 mg (has no administration in time range)  Melatonin TABS 10 tablet (has no administration in time range)  gabapentin (NEURONTIN) capsule 100 mg (has no administration in time range)  acetaminophen (TYLENOL) tablet 1,000 mg (has no administration in time range)    Or  acetaminophen (TYLENOL) suppository 650 mg (has no administration in time range)  ondansetron (ZOFRAN) tablet 4 mg (has no administration in time range)     Or  ondansetron (ZOFRAN) injection 4 mg (has no administration in time range)  enoxaparin (LOVENOX) injection 40 mg (has no administration in time range)  sodium chloride 0.9 % bolus 1,000 mL (1,000 mLs Intravenous New Bag/Given 02/16/22 1625)     IMPRESSION / MDM / ASSESSMENT AND PLAN / ED COURSE  I reviewed the triage vital signs and the nursing notes.                              Differential diagnosis includes, but is not limited to, electrolyte normality, pneumonia, viral illness, dehydration     Clinical Course as of 02/16/22 1906  Sat Feb 16, 2022  1803 Further history by family member at bedside notes that the patient has been confused for the past 13 days which is unlike her.  Additionally, she does not have a history of falls but has had several falls lately.  Despite IV saline bolus, she continues to feel weak.  I will obtain an MRI.  Additionally, this may be due to her acute hyponatremia.  With her functional decline and encephalopathy, will need to admit for further evaluation. [PS]    Clinical Course User Index [PS] Sharman Cheek, MD     FINAL CLINICAL IMPRESSION(S) / ED DIAGNOSES   Final diagnoses:  Hyponatremia  Generalized weakness  Confusion     Rx / DC Orders   ED Discharge Orders     None        Note:  This document was prepared using Dragon voice recognition software and may include unintentional dictation errors.   Sharman Cheek, MD 02/16/22 [phone]

## 2022-02-16 NOTE — H&P (Addendum)
History and Physical   Jacqueline Williams [id number] DOB: 02/01/1951 DOA: 02/16/2022  PCP: Virgie Dad, FNP  Outpatient Specialists: Dr. Izora Ribas, neurosurgery Patient coming from: [facility]  I have personally briefly reviewed patient's old medical records in [place].  Chief Concern: Altered mental status  HPI: Jacqueline Williams is a 71 year old female with medical history of obesity, hypertension, chronic pain syndrome, GERD, neuropathy, generalized anxiety, acquired scoliosis, history of neurogenic claudication due to lumbar spinal stenosis, depression, insomnia, who presents to the emergency department from [facility] for chief concerns of altered mental status.  Initial vitals in the emergency department showed temperature 98.7, respiration rate of 18, heart rate of 78, blood pressure 143/66, SPO2 of 96% on room air.  Labs in the emergency department showed serum sodium 127, potassium 3.7, chloride 89, bicarb 27, BUN of 19, serum creatinine of 1.08, nonfasting blood glucose 181, GFR 56.  WBC is 8.2, hemoglobin 12.8, platelets of 279.  Lactic acid was elevated at 2.6.  COVID/influenza A/influenza B PCR were negative.  UA was negative for leukocytes and nitrates.  ED treatment: Sodium chloride 1 L bolus.  At bedside patient is able to tell me her full name, her age, the current calendar year and her current location of hospital.  She states she is in the hospital because she slipped and fell.  Per EDP, mL boluses at bedside showed EDP all the missed calls that patient placed last night and voicemail asking him to bring her tea.  At bedside, patient states she does not remember any of this.  She asked and confirm my name twice.  She denies any chest pain, shortness of breath, abdominal pain, dysuria, hematuria, diarrhea, syncope, loss of consciousness, trauma.  At the close of my evaluation, she asked me what my name is.  No family was at bedside for me to confirm that  she had called family multiple times overnight.  ROS: Constitutional: no weight change, no fever ENT/Mouth: no sore throat, no rhinorrhea Eyes: no eye pain, no vision changes Cardiovascular: no chest pain, no dyspnea,  no edema, no palpitations Respiratory: no cough, no sputum, no wheezing Gastrointestinal: no nausea, no vomiting, no diarrhea, no constipation Genitourinary: no urinary incontinence, no dysuria, no hematuria Musculoskeletal: no arthralgias, no myalgias Skin: no skin lesions, no pruritus, Neuro: + weakness, no loss of consciousness, no syncope Psych: no anxiety, no depression, no decrease appetite Heme/Lymph: no bruising, no bleeding  ED Course: Discussed with emergency medicine provider, patient requiring hospitalization for chief concerns of altered mental status, presumed secondary to hyponatremia.  Assessment/Plan  Principal Problem:   Altered mental status Active Problems:   Hyponatremia   Anemia   Class 2 obesity   Hypertension   Anxiety and depression   Generalized weakness   * Altered mental status Assessment & Plan - Etiology work-up in progress - MRI of the brain is pending - Presumed secondary to hyponatremia in setting of antibiotic use and furosemide - Status post sodium chloride 1 L bolus per EDP, no further fluid ordered by myself at this time - Check B12, ammonia - BMP in the a.m. - If patient's mentation does not improve , I recommend AM team to consider neurology consult for evaluation of normal pressure hydrocephalus  Cardiovascular and Mediastinum Hypertension Assessment & Plan - Resumed home lisinopril 10 mg daily - Holding home furosemide 20 mg daily  Other Generalized weakness Assessment & Plan - Fall precautions  Anxiety and depression Assessment & Plan -  Resumed home trazodone 50 mg nightly, venlafaxine 150 mg nightly - Resumed home lorazepam 0.5 mg p.o. twice daily as needed for anxiety  Anemia Assessment & Plan -  Hemoglobin is 12.8 on admission  Hyponatremia Assessment & Plan - Present on admission, etiology is likely multifactorial including Macrobid while patient is on furosemide - Check urine osmolality, urine sodium, serum osmolality - Holding home furosemide at this time - Status post sodium chloride 1 L bolus per EDP - BMP in the a.m.  Chart reviewed.   DVT prophylaxis: Enoxaparin Code Status: Full code Diet: Heart healthy Family Communication: Attempted to call daughter who is POA at [phone], no pickup, left a voicemail Disposition Plan: Pending clinical course Consults called: None at this time Admission status: Telemetry medical, observation  Past Medical History:  Diagnosis Date   Anxiety    Back pain    Bipolar disorder ([place])    Depression    Hypertension    Hypothyroidism    Neuropathy    Osteomyelitis of great toe of left foot ([place]) 2021   Sepsis ([place])    Vitamin B12 deficiency    Past Surgical History:  Procedure Laterality Date   ABDOMINAL HYSTERECTOMY     AMPUTATION TOE Left 09/15/2020   Procedure: AMPUTATION TOE MPJ LEFT;  Surgeon: Samara Deist, DPM;  Location: [facility] ORS;  Service: Podiatry;  Laterality: Left;   AMPUTATION TOE Left 11/10/2020   Procedure: AMPUTATION TOE IPJ LEFT 2ND;  Surgeon: Samara Deist, DPM;  Location: [facility] ORS;  Service: Podiatry;  Laterality: Left;   CHOLECYSTECTOMY     COLONOSCOPY WITH PROPOFOL N/A 04/30/2017   Procedure: COLONOSCOPY WITH PROPOFOL;  Surgeon: Jonathon Bellows, MD;  Location: [facility] ENDOSCOPY;  Service: Endoscopy;  Laterality: N/A;   ENTEROSCOPY N/A 05/09/2017   Procedure: ENTEROSCOPY with deplopyement of capsule for small bowel;  Surgeon: Jonathon Bellows, MD;  Location[facility] ENDOSCOPY;  Service: Endoscopy;  Laterality: N/A;   ESOPHAGOGASTRODUODENOSCOPY (EGD) WITH PROPOFOL N/A 05/03/2017   Procedure: ESOPHAGOGASTRODUODENOSCOPY (EGD) WITH PROPOFOL;  Surgeon: Jonathon Bellows, MD;  Location: [facility] ENDOSCOPY;  Service: Gastroenterology;   Laterality: N/A;   GASTRIC BYPASS     x2   GIVENS CAPSULE STUDY N/A 05/07/2017   Procedure: GIVENS CAPSULE STUDY;  Surgeon: Jonathon Bellows, MD;  Location: [facility] ENDOSCOPY;  Service: Endoscopy;  Laterality: N/A;   HERNIA REPAIR     LUMBAR FUSION     Social History:  reports that she has never smoked. She has never used smokeless tobacco. She reports that she does not drink alcohol and does not use drugs.  Allergies  Allergen Reactions   Other Shortness Of Breath    caffergot   Penicillin G Sodium Anaphylaxis   Aspirin Other (See Comments)    S/p Gastric bypass   Ergotamine Tartrate Other (See Comments)   Ibuprofen Other (See Comments)    S/p gastric bypass   Ketorolac Tromethamine Nausea Only   Naproxen Nausea Only   Nsaids     Other reaction(s): Other (See Comments) GI bleed   Olanzapine Other (See Comments)   Oxaprozin Other (See Comments)   Penicillins    Seroquel [Quetiapine Fumarate] Other (See Comments)    hallucinations   Family History  Problem Relation Age of Onset   Lung cancer Father    Hypertension Neg Hx    Diabetes Neg Hx    Family history: Family history reviewed and not pertinent  Prior to Admission medications   Medication Sig Start Date End Date Taking? Authorizing Provider  acetaminophen (TYLENOL)  325 MG tablet Take 650 mg by mouth every 6 (six) hours as needed.   Yes [provider]  cetirizine (ZYRTEC) 10 MG tablet Take 10 mg by mouth daily.   Yes [provider]  CREON 24000-76000 units CPEP Take 48,000 Units by mouth 3 (three) times daily before meals.  04/28/17  Yes [provider]  cyanocobalamin (,VITAMIN B-12,) 1000 MCG/ML injection Inject 1 mL into the skin every 14 (fourteen) days.  04/16/17  Yes [provider]  DEPO-ESTRADIOL 5 MG/ML injection Inject 0.4 mg into the muscle every 28 (twenty-eight) days.  04/22/17  Yes [provider]  divalproex (DEPAKOTE) 250 MG DR tablet Take 250 mg by mouth 2 (two) times  daily. 01/17/22  Yes [provider]  ferrous gluconate (FERGON) 324 MG tablet Take 1 tablet (324 mg total) by mouth 2 (two) times daily with a meal. 01/30/22  Yes Starla Link, Kshitiz, MD  fluticasone (FLONASE) 50 MCG/ACT nasal spray Place 1 spray into both nostrils daily as needed for allergies or rhinitis.   Yes [provider]  folic acid (FOLVITE) 1 MG tablet Take 1 mg by mouth daily. 11/05/21  Yes [provider]  furosemide (LASIX) 20 MG tablet Take 20 mg by mouth daily. 03/31/17  Yes [provider]  gabapentin (NEURONTIN) 100 MG capsule Take 100 mg by mouth at bedtime. 02/25/17  Yes [provider]  lactase (LACTAID) 3000 units tablet Take 3,000 Units by mouth 3 (three) times daily with meals.   Yes [provider]  levothyroxine (SYNTHROID, LEVOTHROID) 25 MCG tablet Take 25 mcg by mouth daily. 04/26/17  Yes [provider]  lisinopril (ZESTRIL) 10 MG tablet Take 10 mg by mouth daily.   Yes [provider]  LORazepam (ATIVAN) 1 MG tablet Take 0.5 tablets (0.5 mg total) by mouth 2 (two) times daily as needed for anxiety. 01/30/22  Yes Aline August, MD  MACROBID 100 MG capsule Take 100 mg by mouth 2 (two) times daily. 02/12/22  Yes [provider]  magnesium oxide (MAG-OX) 400 MG tablet Take 1 tablet by mouth daily. 10/30/21  Yes [provider]  ondansetron (ZOFRAN) 4 MG tablet Take 4 mg by mouth every 8 (eight) hours as needed. 02/07/22  Yes [provider]  oxybutynin (DITROPAN) 5 MG tablet Take 5 mg by mouth 2 (two) times daily.   Yes [provider]  oxyCODONE-acetaminophen (PERCOCET/ROXICET) 5-325 MG tablet Take 1 tablet by mouth every 6 (six) hours as needed for severe pain. 0630,1100,1600,2030 01/30/22  Yes Aline August, MD  pantoprazole (PROTONIX) 20 MG tablet Take 20 mg by mouth daily. 01/29/22  Yes [provider]  potassium chloride (MICRO-K) 10 MEQ CR capsule Take 10 mEq by mouth 2  (two) times daily. 04/14/17  Yes [provider]  promethazine (PHENERGAN) 25 MG tablet Take 25 mg by mouth every 6 (six) hours as needed. 04/28/17  Yes [provider]  RA MELATONIN 10 MG TABS Take 10 tablets by mouth at bedtime. 10/30/21  Yes [provider]  risperiDONE (RISPERDAL) 2 MG tablet Take 2 mg by mouth 2 (two) times daily. 01/22/22  Yes [provider]  simethicone (MYLICON) 80 MG chewable tablet Chew 80 mg by mouth every 6 (six) hours as needed for flatulence.   Yes [provider]  traZODone (DESYREL) 100 MG tablet Take 50 mg by mouth at bedtime.  04/14/17  Yes [provider]  venlafaxine XR (EFFEXOR-XR) 150 MG 24 hr capsule Take 150  mg by mouth at bedtime.  02/25/17  Yes [provider]  Lido-Capsaicin-Men-Methyl Sal (1ST MEDX-PATCH/ LIDOCAINE) 4-0.374-05-11 % PTCH Apply 1 patch topically daily. Patient not taking: Reported on 02/16/2022 01/24/21   Marlana Salvage, PA  loperamide (IMODIUM) 2 MG capsule Take 2 mg by mouth as needed for diarrhea or loose stools. Patient not taking: Reported on 02/16/2022    [provider]  pantoprazole (PROTONIX) 40 MG tablet Take 40 mg by mouth daily. Patient not taking: Reported on 02/16/2022    [provider]  sucralfate (CARAFATE) 1 g tablet Take 1 g by mouth 4 (four) times daily. Patient not taking: Reported on 02/16/2022 10/19/21   [provider]   Physical Exam: Vitals:   02/16/22 1900 02/16/22 2025 02/16/22 2130 02/17/22 0000  BP: (!) 157/74  (!) 166/87 (!) 166/78  Pulse: 86   88  Resp: (!) 21  19 20   Temp:      TempSrc:      SpO2: 97%   94%  Weight:  105.2 kg    Height:  5\' 8"  (1.727 m)     Constitutional: appears age-appropriate, frail, NAD, calm, comfortable Eyes: PERRL, lids and conjunctivae normal ENMT: Mucous membranes are moist. Posterior pharynx clear of any exudate or lesions. Age-appropriate dentition. Hearing appropriate Neck: normal,  supple, no masses, no thyromegaly Respiratory: clear to auscultation bilaterally, no wheezing, no crackles. Normal respiratory effort. No accessory muscle use.  Cardiovascular: Regular rate and rhythm, no murmurs / rubs / gallops. No extremity edema. 2+ pedal pulses. No carotid bruits.  Abdomen: Obese abdomen, no tenderness, no masses palpated, no hepatosplenomegaly. Bowel sounds positive.  Musculoskeletal: no clubbing / cyanosis. No joint deformity upper and lower extremities. Good ROM, no contractures, no atrophy. Normal muscle tone.  Skin: no rashes, lesions, ulcers. No induration Neurologic: Sensation intact. Strength 5/5 in all 4.  Psychiatric: Normal judgment and insight. Alert and oriented x 3. Normal mood.   EKG: Not indicated at this time  Chest x-ray on Admission: I personally reviewed and I agree with radiologist reading as below.  MR BRAIN WO CONTRAST  Result Date: 02/16/2022 CLINICAL DATA:  Mental status change, unknown cause EXAM: MRI HEAD WITHOUT CONTRAST TECHNIQUE: Multiplanar, multiecho pulse sequences of the brain and surrounding structures were obtained without intravenous contrast. COMPARISON:  09/26/2017 MRI brain, correlation is also made with 01/29/2022 CT head FINDINGS: Brain: No restricted diffusion to suggest acute or subacute infarct. No acute hemorrhage, mass, mass effect, or midline shift. No extra-axial collection. Unchanged size and configuration of the ventricles, which are enlarged, similar to prior exams and favored to represent central cerebral atrophy. Vascular: Normal flow voids. Skull and upper cervical spine: Normal marrow signal. Sinuses/Orbits: Negative. Other: Fluid in the left-greater-than-right mastoid air cells. IMPRESSION: 1. No acute intracranial process. 2. Redemonstrated ventricular prominence, which is favored to represent the sequela of central atrophy but could also be seen in the setting of normal pressure hydrocephalus, which is a clinical  diagnosis. Correlate with symptoms and consider CSF tap test if clinically indicated. Electronically Signed   By: Merilyn Baba M.D.   On: 02/16/2022 23:27   US Venous Img Lower Unilateral Left (DVT)  Result Date: 02/16/2022 CLINICAL DATA:  Left leg swelling EXAM: LEFT LOWER EXTREMITY VENOUS DOPPLER ULTRASOUND TECHNIQUE: Gray-scale sonography with compression, as well as color and duplex ultrasound, were performed to evaluate the deep venous system(s) from the level of the common femoral vein through the popliteal and proximal calf veins. COMPARISON:  None. FINDINGS: VENOUS Normal compressibility of the common femoral, superficial femoral, and popliteal veins, as well as the visualized calf veins. Visualized portions of profunda femoral vein and great saphenous vein unremarkable. No filling defects to suggest DVT on grayscale or color Doppler imaging. Doppler waveforms show normal direction of venous flow, normal respiratory plasticity and response to augmentation. Limited views of the contralateral common femoral vein are unremarkable. OTHER None. Limitations: none IMPRESSION: Negative. Electronically Signed   By: Rolm Baptise M.D.   On: 02/16/2022 23:19   DG Chest Portable 1 View  Result Date: 02/16/2022 CLINICAL DATA:  Weakness EXAM: PORTABLE CHEST 1 VIEW COMPARISON:  01/29/2022 FINDINGS: Artifact overlies the chest. Heart size is normal. Tortuosity of the aorta. Lungs clear except for mild linear scarring at the left base. No sign of active infiltrate, mass, effusion or collapse. IMPRESSION: Mild basilar scarring on the left. Tortuous aorta. No active disease identified. Electronically Signed   By: Nelson Chimes M.D.   On: 02/16/2022 16:19    Labs on Admission: I have personally reviewed following labs  CBC: Recent Labs  Lab 02/16/22 1223  WBC 8.2  NEUTROABS 6.1  HGB 12.8  HCT 38.4  MCV 93.4  PLT [id number]   Basic Metabolic Panel: Recent Labs  Lab 02/16/22 1223  NA 127*  K 3.7  CL 89*  CO2  27  GLUCOSE 181*  BUN 19  CREATININE 1.08*  CALCIUM 8.7*  MG 2.4  PHOS 2.5   GFR: Estimated Creatinine Clearance: 63.3 mL/min (A) (by C-G formula based on SCr of 1.08 mg/dL (H)).  Liver Function Tests: Recent Labs  Lab 02/16/22 1223  AST 21  ALT 18  ALKPHOS 104  BILITOT 0.5  PROT 6.7  ALBUMIN 3.8   Urine analysis:    Component Value Date/Time   COLORURINE YELLOW (A) 02/16/2022 1430   APPEARANCEUR HAZY (A) 02/16/2022 1430   APPEARANCEUR Hazy 04/28/2013 2126   LABSPEC 1.004 (L) 02/16/2022 1430   LABSPEC 1.011 04/28/2013 2126   PHURINE 6.0 02/16/2022 1430   GLUCOSEU NEGATIVE 02/16/2022 1430   GLUCOSEU Negative 04/28/2013 2126   HGBUR NEGATIVE 02/16/2022 1430   BILIRUBINUR NEGATIVE 02/16/2022 1430   BILIRUBINUR Negative 04/28/2013 2126   KETONESUR NEGATIVE 02/16/2022 1430   PROTEINUR NEGATIVE 02/16/2022 1430   NITRITE NEGATIVE 02/16/2022 1430   LEUKOCYTESUR NEGATIVE 02/16/2022 1430   LEUKOCYTESUR 3+ 04/28/2013 2126   Dr. Tobie Poet [facility]  If 7PM-7AM, please contact overnight-coverage provider If 7AM-7PM, please contact day coverage provider [email or web address]  02/17/2022, 2:25 AM

## 2022-02-16 NOTE — Progress Notes (Signed)
PHARMACIST - PHYSICIAN COMMUNICATION  CONCERNING:  Enoxaparin (Lovenox) for DVT Prophylaxis    RECOMMENDATION: Patient was prescribed enoxaprin 40mg  q24 hours for VTE prophylaxis.   Filed Weights   02/16/22 2025  Weight: 105.2 kg (232 lb)    Body mass index is 35.28 kg/m.  Estimated Creatinine Clearance: 63.3 mL/min (A) (by C-G formula based on SCr of 1.08 mg/dL (H)).   Based on [place] patient is candidate for enoxaparin 0.5mg /kg TBW SQ every 24 hours based on BMI being >30.  DESCRIPTION: Pharmacy has adjusted enoxaparin dose per [facility] policy.  Patient is now receiving enoxaparin 52.5 mg every 24 hours   Perina Salvaggio Rodriguez-Guzman PharmD, BCPS 02/16/2022 9:41 PM

## 2022-02-16 NOTE — ED Triage Notes (Signed)
Pt comes into the ED via EMS from [facility], slipped out of bed landing on her buttock, on levaquin for UTI, a/ox2  176/96 [id number]

## 2022-02-16 NOTE — Hospital Course (Addendum)
[name] is a 71 year old female with medical history of obesity, hypertension, chronic pain syndrome, GERD, neuropathy, generalized anxiety, acquired scoliosis, history of neurogenic claudication due to lumbar spinal stenosis, depression, insomnia, who presents to the emergency department from [facility] for chief concerns of altered mental status.  Initial vitals in the emergency department showed temperature 98.7, respiration rate of 18, heart rate of 78, blood pressure 143/66, SPO2 of 96% on room air.  Labs in the emergency department showed serum sodium 127, potassium 3.7, chloride 89, bicarb 27, BUN of 19, serum creatinine of 1.08, nonfasting blood glucose 181, GFR 56.  WBC is 8.2, hemoglobin 12.8, platelets of 279.  Lactic acid was elevated at 2.6.  COVID/influenza A/influenza B PCR were negative.  UA was negative for leukocytes and nitrates.  ED treatment: Sodium chloride 1 L bolus.

## 2022-02-17 DIAGNOSIS — R531 Weakness: Secondary | ICD-10-CM | POA: Diagnosis not present

## 2022-02-17 DIAGNOSIS — E871 Hypo-osmolality and hyponatremia: Secondary | ICD-10-CM

## 2022-02-17 DIAGNOSIS — E669 Obesity, unspecified: Secondary | ICD-10-CM | POA: Diagnosis not present

## 2022-02-17 LAB — LACTIC ACID, PLASMA: Lactic Acid, Venous: 1.4 mmol/L (ref 0.5–1.9)

## 2022-02-17 LAB — CBC
HCT: 38.9 % (ref 36.0–46.0)
Hemoglobin: 13 g/dL (ref 12.0–15.0)
MCH: 31.2 pg (ref 26.0–34.0)
MCHC: 33.4 g/dL (ref 30.0–36.0)
MCV: 93.3 fL (ref 80.0–100.0)
Platelets: 296 10*3/uL (ref 150–400)
RBC: 4.17 MIL/uL (ref 3.87–5.11)
RDW: 12.1 % (ref 11.5–15.5)
WBC: 9.1 10*3/uL (ref 4.0–10.5)
nRBC: 0 % (ref 0.0–0.2)

## 2022-02-17 LAB — BASIC METABOLIC PANEL
Anion gap: 8 (ref 5–15)
BUN: 12 mg/dL (ref 8–23)
CO2: 29 mmol/L (ref 22–32)
Calcium: 8.8 mg/dL — ABNORMAL LOW (ref 8.9–10.3)
Chloride: 94 mmol/L — ABNORMAL LOW (ref 98–111)
Creatinine, Ser: 0.78 mg/dL (ref 0.44–1.00)
GFR, Estimated: >60 mL/min (ref >60)
Glucose, Bld: 134 mg/dL — ABNORMAL HIGH (ref 70–99)
Potassium: 4 mmol/L (ref 3.5–5.1)
Sodium: 131 mmol/L — ABNORMAL LOW (ref 135–145)

## 2022-02-17 LAB — SODIUM, URINE, RANDOM: Sodium, Ur: 28 mmol/L

## 2022-02-17 LAB — AMMONIA: Ammonia: <10 umol/L (ref 9–35)

## 2022-02-17 LAB — OSMOLALITY: Osmolality: 281 mOsm/kg (ref 275–295)

## 2022-02-17 LAB — OSMOLALITY, URINE: Osmolality, Ur: 137 mOsm/kg — ABNORMAL LOW (ref 300–900)

## 2022-02-17 LAB — VITAMIN B12: Vitamin B-12: 2669 pg/mL — ABNORMAL HIGH (ref 180–914)

## 2022-02-17 NOTE — ED Notes (Addendum)
Pt awake this morning and is sitting up eating breakfast Tech helped prepare her food to eat. Tech got ginger ale and fresh water to go with breakfast.  Pt is more alert and with it this morning compared to encounters with pt yesterday upon her arrival

## 2022-02-17 NOTE — Assessment & Plan Note (Signed)
-   Hemoglobin is 12.8 on admission

## 2022-02-17 NOTE — NC FL2 (Signed)
[place] LEVEL OF CARE SCREENING TOOL     IDENTIFICATION  Patient Name: Jacqueline Williams Birthdate: Nov 14, 1951 Sex: female Admission Date (Current Location): 02/16/2022  [facility] [place] Number:  Engineering geologist and Address:  [facility], [address] [place], [place] [address]      Provider Number: [id number]  Attending Physician Name and Address:  Wyvonnia Dusky, MD  Relative Name and Phone Number:       Current Level of Care: Hospital Recommended Level of Care: [place] Prior Approval Number:    Date Approved/Denied:   PASRR Number: [id number]  Discharge Plan: SNF    Current Diagnoses: Patient Active Problem List   Diagnosis Date Noted   Altered mental status 02/16/2022   Acute encephalopathy 01/29/2022   Hypokalemia 01/29/2022   Hypothyroidism 01/29/2022   Class 2 obesity 01/29/2022   Hypertension 01/29/2022   Anxiety and depression 01/29/2022   Generalized weakness 01/29/2022   Thyroid nodule 01/29/2022   Chronic osteomyelitis of toe, left ([place]) 05/11/2020   Anemia    Acute posthemorrhagic anemia 05/05/2017   History of recent blood transfusion 05/05/2017   Hyponatremia 05/05/2017   Dehydration 05/05/2017   Acute renal insufficiency 05/05/2017   Leukocytosis 05/05/2017   Diarrhea 05/05/2017   GI bleed 04/28/2017    Orientation RESPIRATION BLADDER Height & Weight     Situation, Self  Normal Incontinent Weight: 232 lb (105.2 kg) Height:  5\' 8"  (172.7 cm)  BEHAVIORAL SYMPTOMS/MOOD NEUROLOGICAL BOWEL NUTRITION STATUS      Incontinent Diet (heart healthy, thin liquids)  AMBULATORY STATUS COMMUNICATION OF NEEDS Skin   Limited Assist Verbally Normal                       Personal Care Assistance Level of Assistance  Bathing, Feeding, Dressing Bathing Assistance: Limited assistance Feeding assistance: Independent Dressing Assistance: Limited assistance     Functional  Limitations Info  Sight, Hearing, Speech Sight Info: Adequate Hearing Info: Adequate Speech Info: Adequate    SPECIAL CARE FACTORS FREQUENCY  PT (By licensed PT), OT (By licensed OT)     PT Frequency: 5x OT Frequency: 5x            Contractures Contractures Info: Not present    Additional Factors Info  Code Status, Allergies Code Status Info: full code Allergies Info: Other, Penicillin G Sodium, Aspirin, Ergotamine Tartrate, Ibuprofen, Ketorolac Tromethamine, Naproxen, Nsaids, Olanzapine, Oxaprozin, Penicillins, Seroquel (Quetiapine Fumarate)           Current Medications (02/17/2022):  This is the current hospital active medication list Current Facility-Administered Medications  Medication Dose Route Frequency Provider Last Rate Last Admin   acetaminophen (TYLENOL) tablet 1,000 mg  1,000 mg Oral Q6H PRN Cox, Amy N, DO       Or   acetaminophen (TYLENOL) suppository 650 mg  650 mg Rectal Q6H PRN Cox, Amy N, DO       divalproex (DEPAKOTE) DR tablet 250 mg  250 mg Oral BID Cox, Amy N, DO   250 mg at 02/17/22 0913   enoxaparin (LOVENOX) injection 52.5 mg  0.5 mg/kg Subcutaneous Q24H Cox, Amy N, DO   52.5 mg at 02/17/22 0914   fluticasone (FLONASE) 50 MCG/ACT nasal spray 1 spray  1 spray Each Nare Daily PRN Cox, Amy N, DO       gabapentin (NEURONTIN) capsule 100 mg  100 mg Oral QHS Cox, Amy N, DO   100 mg at  02/17/22 0020   lactase (LACTAID) tablet 3,000 Units  3,000 Units Oral TID WC Cox, Amy N, DO   3,000 Units at 02/17/22 1146   levothyroxine (SYNTHROID) tablet 25 mcg  25 mcg Oral Daily Cox, Amy N, DO   25 mcg at 02/17/22 [id number]   lipase/protease/amylase (CREON) capsule 36,000 Units  36,000 Units Oral TID AC Cox, Amy N, DO   36,000 Units at 02/17/22 1145   lisinopril (ZESTRIL) tablet 10 mg  10 mg Oral Daily Cox, Amy N, DO   10 mg at 02/17/22 0913   LORazepam (ATIVAN) tablet 0.5 mg  0.5 mg Oral BID PRN Cox, Amy N, DO       melatonin tablet 5 mg  5 mg Oral QHS Cox, Amy N, DO   5  mg at 02/17/22 0020   ondansetron (ZOFRAN) tablet 4 mg  4 mg Oral Q6H PRN Cox, Amy N, DO       Or   ondansetron (ZOFRAN) injection 4 mg  4 mg Intravenous Q6H PRN Cox, Amy N, DO       oxybutynin (DITROPAN) tablet 5 mg  5 mg Oral BID Cox, Amy N, DO   5 mg at 02/17/22 [id number]   oxyCODONE-acetaminophen (PERCOCET/ROXICET) 5-325 MG per tablet 1 tablet  1 tablet Oral Q6H PRN Cox, Amy N, DO   1 tablet at 02/17/22 1326   pantoprazole (PROTONIX) EC tablet 20 mg  20 mg Oral Daily Cox, Amy N, DO   20 mg at 02/17/22 [id number]   risperiDONE (RISPERDAL) tablet 2 mg  2 mg Oral BID Cox, Amy N, DO   2 mg at 02/17/22 0913   simethicone (MYLICON) chewable tablet 80 mg  80 mg Oral Q6H PRN Cox, Amy N, DO       traZODone (DESYREL) tablet 50 mg  50 mg Oral QHS Cox, Amy N, DO   50 mg at 02/17/22 0020   venlafaxine XR (EFFEXOR-XR) 24 hr capsule 150 mg  150 mg Oral QHS Cox, Amy N, DO   150 mg at 02/17/22 0021     Discharge Medications: Please see discharge summary for a list of discharge medications.  Relevant Imaging Results:  Relevant Lab Results:   Additional Information [id number]  Eileen Stanford, LCSW

## 2022-02-17 NOTE — Assessment & Plan Note (Addendum)
-   Present on admission, etiology is likely multifactorial including Macrobid while patient is on furosemide - Check urine osmolality, urine sodium, serum osmolality - Holding home furosemide at this time - Status post sodium chloride 1 L bolus per EDP - BMP in the a.m.

## 2022-02-17 NOTE — Assessment & Plan Note (Signed)
-   Resumed home lisinopril 10 mg daily - Holding home furosemide 20 mg daily

## 2022-02-17 NOTE — Progress Notes (Addendum)
PROGRESS NOTE    Jacqueline Williams  DJS:[id number] DOB: 1951/03/09 DOA: 02/16/2022 PCP: Melonie Florida, FNP  Assessment & Plan:   Principal Problem:   Altered mental status Active Problems:   Hyponatremia   Anemia   Class 2 obesity   Hypertension   Anxiety and depression   Generalized weakness   Encephalopathy: possibly metabolic, possibly secondary to hyponatremia. MRI brain shows no acute intracranial abnormality. Ammonia < 10. B12 is elevated. Mental status is close to baseline  Hyponatremia: trending up from day prior. Continue to hold lasix.   HTN: continue on home dose of lisinopril. Hold lasix  Generalized weakness: PT/OT consulted  Depression: severity unknown. Continue on home dose of venlafaxine, trazodone & risperidone   Anxiety: severity unknown. Continue on lorazepam  Peripheral neuropathy: continue on home dose of gabapentin   Hypothyroidism: continue on levothyroxine   Obesity: BMI 35.2. Would benefit from weight loss    DVT prophylaxis: lovenox Code Status: Full  Family Communication: called pt's daughter, Jacqueline Williams, no answer so I left a voicemail  Disposition Plan: depends on PT/OT recs   Level of care: Telemetry Medical  Status is: Observation The patient remains OBS appropriate and will d/c before 2 midnights.     Consultants:    Procedures:   Antimicrobials:    Subjective: Pt c/o back pain  Objective: Vitals:   02/17/22 0415 02/17/22 0500 02/17/22 0540 02/17/22 0600  BP:  (!) 148/73 (!) 145/77 (!) 154/86  Pulse: 76 83 89 92  Resp:  17 18   Temp:   99.3 F (37.4 C)   TempSrc:   Oral   SpO2: 93% 92% 92% 93%  Weight:      Height:        Intake/Output Summary (Last 24 hours) at 02/17/2022 0825 Last data filed at 02/17/2022 0542 Gross per 24 hour  Intake 1000 ml  Output --  Net 1000 ml   Filed Weights   02/16/22 2025  Weight: 105.2 kg    Examination:  General exam: Appears calm and comfortable  Respiratory system:  Clear to auscultation. Respiratory effort normal. Cardiovascular system: S1 & S2+. No rubs, gallops or clicks.  Gastrointestinal system: Abdomen is nondistended, soft and nontender. Normal bowel sounds heard. Central nervous system: Alert and oriented. Moves all extremities Psychiatry: Judgement and insight appear normal. Flat mood and affect     Data Reviewed: I have personally reviewed following labs and imaging studies  CBC: Recent Labs  Lab 02/16/22 1223 02/17/22 0623  WBC 8.2 9.1  NEUTROABS 6.1  --   HGB 12.8 13.0  HCT 38.4 38.9  MCV 93.4 93.3  PLT 279 296   Basic Metabolic Panel: Recent Labs  Lab 02/16/22 1223 02/17/22 0353  NA 127* 131*  K 3.7 4.0  CL 89* 94*  CO2 27 29  GLUCOSE 181* 134*  BUN 19 12  CREATININE 1.08* 0.78  CALCIUM 8.7* 8.8*  MG 2.4  --   PHOS 2.5  --    GFR: Estimated Creatinine Clearance: 85.4 mL/min (by C-G formula based on SCr of 0.78 mg/dL). Liver Function Tests: Recent Labs  Lab 02/16/22 1223  AST 21  ALT 18  ALKPHOS 104  BILITOT 0.5  PROT 6.7  ALBUMIN 3.8   No results for input(s): LIPASE, AMYLASE in the last 168 hours. Recent Labs  Lab 02/17/22 0353  AMMONIA <10   Coagulation Profile: No results for input(s): INR, PROTIME in the last 168 hours. Cardiac Enzymes: No results for input(s): CKTOTAL, CKMB,  CKMBINDEX, TROPONINI in the last 168 hours. BNP (last 3 results) No results for input(s): PROBNP in the last 8760 hours. HbA1C: No results for input(s): HGBA1C in the last 72 hours. CBG: No results for input(s): GLUCAP in the last 168 hours. Lipid Profile: No results for input(s): CHOL, HDL, LDLCALC, TRIG, CHOLHDL, LDLDIRECT in the last 72 hours. Thyroid Function Tests: No results for input(s): TSH, T4TOTAL, FREET4, T3FREE, THYROIDAB in the last 72 hours. Anemia Panel: Recent Labs    02/16/22 2014  VITAMINB12 2,669*   Sepsis Labs: Recent Labs  Lab 02/16/22 1223 02/16/22 2014 02/17/22 0353  LATICACIDVEN 2.6*  0.7 1.4    Recent Results (from the past 240 hour(s))  Resp Panel by RT-PCR (Flu A&B, Covid) Nasopharyngeal Swab     Status: None   Collection Time: 02/16/22  4:12 PM   Specimen: Nasopharyngeal Swab; Nasopharyngeal(NP) swabs in vial transport medium  Result Value Ref Range Status   SARS Coronavirus 2 by RT PCR NEGATIVE NEGATIVE Final    Comment: (NOTE) SARS-CoV-2 target nucleic acids are NOT DETECTED.  The SARS-CoV-2 RNA is generally detectable in upper respiratory specimens during the acute phase of infection. The lowest concentration of SARS-CoV-2 viral copies this assay can detect is 138 copies/mL. A negative result does not preclude SARS-Cov-2 infection and should not be used as the sole basis for treatment or other patient management decisions. A negative result may occur with  improper specimen collection/handling, submission of specimen other than nasopharyngeal swab, presence of viral mutation(s) within the areas targeted by this assay, and inadequate number of viral copies(<138 copies/mL). A negative result must be combined with clinical observations, patient history, and epidemiological information. The expected result is Negative.  Fact Sheet for Patients:  BloggerCourse.com  Fact Sheet for Healthcare Providers:  SeriousBroker.it  This test is no t yet approved or cleared by the [place] FDA and  has been authorized for detection and/or diagnosis of SARS-CoV-2 by FDA under an Emergency Use Authorization (EUA). This EUA will remain  in effect (meaning this test can be used) for the duration of the COVID-19 declaration under Section 564(b)(1) of the Act, 21 U.S.C.section 360bbb-3(b)(1), unless the authorization is terminated  or revoked sooner.       Influenza A by PCR NEGATIVE NEGATIVE Final   Influenza B by PCR NEGATIVE NEGATIVE Final    Comment: (NOTE) The Xpert Xpress SARS-CoV-2/FLU/RSV plus assay is  intended as an aid in the diagnosis of influenza from Nasopharyngeal swab specimens and should not be used as a sole basis for treatment. Nasal washings and aspirates are unacceptable for Xpert Xpress SARS-CoV-2/FLU/RSV testing.  Fact Sheet for Patients: BloggerCourse.com  Fact Sheet for Healthcare Providers: SeriousBroker.it  This test is not yet approved or cleared by the [place] FDA and has been authorized for detection and/or diagnosis of SARS-CoV-2 by FDA under an Emergency Use Authorization (EUA). This EUA will remain in effect (meaning this test can be used) for the duration of the COVID-19 declaration under Section 564(b)(1) of the Act, 21 U.S.C. section 360bbb-3(b)(1), unless the authorization is terminated or revoked.  Performed at [facility], [address] [place], [place] [address]          Radiology Studies: MR BRAIN WO CONTRAST  Result Date: 02/16/2022 CLINICAL DATA:  Mental status change, unknown cause EXAM: MRI HEAD WITHOUT CONTRAST TECHNIQUE: Multiplanar, multiecho pulse sequences of the brain and surrounding structures were obtained without intravenous contrast. COMPARISON:  09/26/2017 MRI brain, correlation is also made  with 01/29/2022 CT head FINDINGS: Brain: No restricted diffusion to suggest acute or subacute infarct. No acute hemorrhage, mass, mass effect, or midline shift. No extra-axial collection. Unchanged size and configuration of the ventricles, which are enlarged, similar to prior exams and favored to represent central cerebral atrophy. Vascular: Normal flow voids. Skull and upper cervical spine: Normal marrow signal. Sinuses/Orbits: Negative. Other: Fluid in the left-greater-than-right mastoid air cells. IMPRESSION: 1. No acute intracranial process. 2. Redemonstrated ventricular prominence, which is favored to represent the sequela of central atrophy but could also be seen in the  setting of normal pressure hydrocephalus, which is a clinical diagnosis. Correlate with symptoms and consider CSF tap test if clinically indicated. Electronically Signed   By: Wiliam Ke M.D.   On: 02/16/2022 23:27   US Venous Img Lower Unilateral Left (DVT)  Result Date: 02/16/2022 CLINICAL DATA:  Left leg swelling EXAM: LEFT LOWER EXTREMITY VENOUS DOPPLER ULTRASOUND TECHNIQUE: Gray-scale sonography with compression, as well as color and duplex ultrasound, were performed to evaluate the deep venous system(s) from the level of the common femoral vein through the popliteal and proximal calf veins. COMPARISON:  None. FINDINGS: VENOUS Normal compressibility of the common femoral, superficial femoral, and popliteal veins, as well as the visualized calf veins. Visualized portions of profunda femoral vein and great saphenous vein unremarkable. No filling defects to suggest DVT on grayscale or color Doppler imaging. Doppler waveforms show normal direction of venous flow, normal respiratory plasticity and response to augmentation. Limited views of the contralateral common femoral vein are unremarkable. OTHER None. Limitations: none IMPRESSION: Negative. Electronically Signed   By: Charlett Nose M.D.   On: 02/16/2022 23:19   DG Chest Portable 1 View  Result Date: 02/16/2022 CLINICAL DATA:  Weakness EXAM: PORTABLE CHEST 1 VIEW COMPARISON:  01/29/2022 FINDINGS: Artifact overlies the chest. Heart size is normal. Tortuosity of the aorta. Lungs clear except for mild linear scarring at the left base. No sign of active infiltrate, mass, effusion or collapse. IMPRESSION: Mild basilar scarring on the left. Tortuous aorta. No active disease identified. Electronically Signed   By: Paulina Fusi M.D.   On: 02/16/2022 16:19        Scheduled Meds:  divalproex  250 mg Oral BID   enoxaparin (LOVENOX) injection  0.5 mg/kg Subcutaneous Q24H   gabapentin  100 mg Oral QHS   lactase  3,000 Units Oral TID WC   levothyroxine   25 mcg Oral Daily   lipase/protease/amylase  36,000 Units Oral TID AC   lisinopril  10 mg Oral Daily   melatonin  5 mg Oral QHS   oxybutynin  5 mg Oral BID   pantoprazole  20 mg Oral Daily   risperiDONE  2 mg Oral BID   traZODone  50 mg Oral QHS   venlafaxine XR  150 mg Oral QHS   Continuous Infusions:   LOS: 0 days    Time spent: 25 mins     Charise Killian, MD [facility] Pager [phone]  If 7PM-7AM, please contact night-coverage 02/17/2022, 8:25 AM

## 2022-02-17 NOTE — ED Notes (Signed)
ED Tech changed suction canister on wall with . Pt is dry and clean at this time

## 2022-02-17 NOTE — Assessment & Plan Note (Signed)
Fall precautions.  

## 2022-02-17 NOTE — Evaluation (Signed)
Physical Therapy Evaluation Patient Details Name: Jacqueline Williams MRN: [id number] DOB: 1951/03/25 Today's Date: 02/17/2022  History of Present Illness  [name] is a 71 year old female with medical history of obesity, hypertension, chronic pain syndrome, GERD, neuropathy, generalized anxiety, acquired scoliosis, history of neurogenic claudication due to lumbar spinal stenosis, depression, insomnia, who presents to the emergency department from [facility] for chief concerns of altered mental status and slipped out of bed landing on her buttock, on levaquin for UTI   Clinical Impression  Patient received on stretcher in ED. Requires encouragement to participate in PT evaluation as she is fearful of moving/falling. Found bed was wet upon removing blankets. NT arrived to assist. Patient requires +2 mod assist for bed mobility. Min/mod +2 for sit to stand at edge of stretcher. She stands for a brief time, and is shaky with standing. Unable to progress to ambulation at this time. Patient will continue to benefit from skilled PT while here to improve functional mobility, independence, strength and safety.         Recommendations for follow up therapy are one component of a multi-disciplinary discharge planning process, led by the attending physician.  Recommendations may be updated based on patient status, additional functional criteria and insurance authorization.  Follow Up Recommendations Skilled nursing-short term rehab (<3 hours/day)    Assistance Recommended at Discharge Frequent or constant Supervision/Assistance  Patient can return home with the following  Two people to help with walking and/or transfers;A lot of help with bathing/dressing/bathroom;Assistance with cooking/housework;Direct supervision/assist for medications management;Assist for transportation;Assistance with feeding    Equipment Recommendations None recommended by PT  Recommendations for Other Services        Functional Status Assessment Patient has had a recent decline in their functional status and demonstrates the ability to make significant improvements in function in a reasonable and predictable amount of time.     Precautions / Restrictions Precautions Precautions: Fall Precaution Comments: 2 recent falls Restrictions Weight Bearing Restrictions: No      Mobility  Bed Mobility Overal bed mobility: Needs Assistance Bed Mobility: Supine to Sit, Sit to Supine     Supine to sit: Mod assist, +2 for physical assistance, HOB elevated Sit to supine: Mod assist, +2 for physical assistance   General bed mobility comments: patient is very feaful during mobility. Prefers 2 people present    Transfers Overall transfer level: Needs assistance Equipment used: Rolling walker (2 wheels) Transfers: Sit to/from Stand Sit to Stand: Min assist, +2 physical assistance, +2 safety/equipment           General transfer comment: Patient is able to stand briefly at edge of bed to get linens changed. Legs wobbly in standing.    Ambulation/Gait               General Gait Details: deferred  Stairs            Wheelchair Mobility    Modified Rankin (Stroke Patients Only)       Balance Overall balance assessment: Needs assistance, History of Falls Sitting-balance support: Feet supported Sitting balance-Leahy Scale: Fair Sitting balance - Comments: able to sit edge of bed with min guard/close supervision +2 per her request   Standing balance support: Bilateral upper extremity supported, Reliant on assistive device for balance Standing balance-Leahy Scale: Poor Standing balance comment: significant arm shaking noted with static standing; unable to tolerated >30 seconds in standing before requesting to sit back down  Pertinent Vitals/Pain Pain Assessment Pain Assessment: Faces Faces Pain Scale: Hurts little more Pain Location: low  back Pain Descriptors / Indicators: Discomfort, Sore Pain Intervention(s): Patient requesting pain meds-RN notified, Repositioned    Home Living Family/patient expects to be discharged to:: Skilled nursing facility                        Prior Function Prior Level of Function : Independent/Modified Independent             Mobility Comments: Ambulates with Rollator ADLs Comments: aide comes and gives patient baths due to back pain for about a year and a half     Hand Dominance   Dominant Hand: Right    Extremity/Trunk Assessment   Upper Extremity Assessment Upper Extremity Assessment: Defer to OT evaluation    Lower Extremity Assessment Lower Extremity Assessment: Generalized weakness    Cervical / Trunk Assessment Cervical / Trunk Assessment: Normal  Communication   Communication: No difficulties  Cognition Arousal/Alertness: Awake/alert Behavior During Therapy: WFL for tasks assessed/performed, Flat affect Overall Cognitive Status: Within Functional Limits for tasks assessed                                 General Comments: A&Ox4 to siutation, location, year, and self        General Comments      Exercises     Assessment/Plan    PT Assessment Patient needs continued PT services  PT Problem List Decreased strength;Decreased mobility;Decreased activity tolerance;Decreased balance;Pain;Decreased safety awareness;Obesity       PT Treatment Interventions DME instruction;Therapeutic activities;Gait training;Therapeutic exercise;Balance training;Functional mobility training;Stair training;Neuromuscular re-education;Patient/family education    PT Goals (Current goals can be found in the Care Plan section)  Acute Rehab PT Goals Patient Stated Goal: to return to [facility] PT Goal Formulation: With patient Time For Goal Achievement: 03/03/22 Potential to Achieve Goals: Fair    Frequency Min 2X/week     Co-evaluation                AM-PAC PT "6 Clicks" Mobility  Outcome Measure Help needed turning from your back to your side while in a flat bed without using bedrails?: A Lot Help needed moving from lying on your back to sitting on the side of a flat bed without using bedrails?: A Lot Help needed moving to and from a bed to a chair (including a wheelchair)?: A Lot Help needed standing up from a chair using your arms (e.g., wheelchair or bedside chair)?: A Lot Help needed to walk in hospital room?: Total Help needed climbing 3-5 steps with a railing? : Total 6 Click Score: 10    End of Session   Activity Tolerance: Patient tolerated treatment well Patient left: in bed;with nursing/sitter in room Nurse Communication: Mobility status PT Visit Diagnosis: Unsteadiness on feet (R26.81);Other abnormalities of gait and mobility (R26.89);Repeated falls (R29.6);Difficulty in walking, not elsewhere classified (R26.2);Muscle weakness (generalized) (M62.81)    Time: 5176-1607 PT Time Calculation (min) (ACUTE ONLY): 17 min   Charges:   PT Evaluation $PT Eval Moderate Complexity: 1 Mod          Olegario Emberson, PT, GCS 02/17/22,12:22 PM

## 2022-02-17 NOTE — TOC Initial Note (Signed)
Transition of Care [facility]) - Initial/Assessment Note    Patient Details  Name: Jacqueline Williams MRN: [id number] Date of Birth: 03-May-1951  [facility]) CM/SW Contact:    Jacqueline Krabbe, LCSW Phone Number: 02/17/2022, 4:15 PM  Clinical Narrative:   CSW called pt's daughter and left voicemail to discuss dc plan. Pt is from Jacqueline Williams. PT is recommending SNF. Awaiting pt's daughters call back to discuss.                Expected Discharge Plan: Skilled Nursing Facility Barriers to Discharge: Continued Medical Work up   Patient Goals and CMS Choice     Choice offered to / list presented to : Jacqueline Williams POA / Guardian, Adult Children  Expected Discharge Plan and Services Expected Discharge Plan: Skilled Nursing Facility In-house Referral: Clinical Social Work     Living arrangements for the past 2 months: Assisted Living Facility                                      Prior Living Arrangements/Services Living arrangements for the past 2 months: Assisted Living Facility   Patient language and need for interpreter reviewed:: Yes Do you feel safe going back to the place where you live?: Yes      Need for Family Participation in Patient Care: Yes (Comment) Care giver support system in place?: Yes (comment)   Criminal Activity/Legal Involvement Pertinent to Current Situation/Hospitalization: No - Comment as needed  Activities of Daily Living Home Assistive Devices/Equipment: Walker (specify type) ADL Screening (condition at time of admission) Patient's cognitive ability adequate to safely complete daily activities?: Yes Is the patient deaf or have difficulty hearing?: No Does the patient have difficulty seeing, even when wearing glasses/contacts?: No Does the patient have difficulty concentrating, remembering, or making decisions?: No Patient able to express need for assistance with ADLs?: Yes Does the patient have difficulty dressing or bathing?: Yes Independently  performs ADLs?: No Communication: Independent Dressing (OT): Needs assistance Is this a change from baseline?: Pre-admission baseline Grooming: Needs assistance Is this a change from baseline?: Pre-admission baseline Feeding: Independent Bathing: Needs assistance Is this a change from baseline?: Pre-admission baseline Toileting: Needs assistance Is this a change from baseline?: Pre-admission baseline In/Out Bed: Needs assistance Is this a change from baseline?: Pre-admission baseline Walks in Home: Needs assistance Is this a change from baseline?: Pre-admission baseline Does the patient have difficulty walking or climbing stairs?: Yes Weakness of Legs: Both Weakness of Arms/Hands: Both  Permission Sought/Granted Permission sought to share information with : Family Supports Permission granted to share information with : Yes, Release of Information Signed  Share Information with NAME: Jacqueline Williams     Permission granted to share info w Relationship: daughter/legal guardian     Emotional Assessment Appearance:: Appears stated age Attitude/Demeanor/Rapport: Unable to Assess Affect (typically observed): Unable to Assess Orientation: : Oriented to Self, Oriented to Place, Oriented to  Time, Oriented to Situation Alcohol / Substance Use: Not Applicable Psych Involvement: No (comment)  Admission diagnosis:  Confusion [R41.0] Hyponatremia [E87.1] Altered mental status [R41.82] Leg swelling [M79.89] Generalized weakness [R53.1] Patient Active Problem List   Diagnosis Date Noted   Altered mental status 02/16/2022   Acute encephalopathy 01/29/2022   Hypokalemia 01/29/2022   Hypothyroidism 01/29/2022   Class 2 obesity 01/29/2022   Hypertension 01/29/2022   Anxiety and depression 01/29/2022   Generalized weakness 01/29/2022   Thyroid  nodule 01/29/2022   Chronic osteomyelitis of toe, 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/11/2020   Anemia    Acute posthemorrhagic anemia 05/05/2017   History of recent blood  transfusion 05/05/2017   Hyponatremia 05/05/2017   Dehydration 05/05/2017   Acute renal insufficiency 05/05/2017   Leukocytosis 05/05/2017   Diarrhea 05/05/2017   GI bleed 04/28/2017   PCP:  Jacqueline Florida, FNP Williams:   Jacqueline Williams [id number] Jacqueline Williams, [place] - [address] [place] [address] Phone: [phone] Fax: [phone]     Social Determinants of Health (SDOH) Interventions    Readmission Risk Interventions No flowsheet data found.

## 2022-02-17 NOTE — Assessment & Plan Note (Signed)
-   Etiology work-up in progress - MRI of the brain is pending - Presumed secondary to hyponatremia in setting of antibiotic use and furosemide - Status post sodium chloride 1 L bolus per EDP, no further fluid ordered by myself at this time - BMP in the a.m.

## 2022-02-17 NOTE — Assessment & Plan Note (Signed)
-   Resumed home trazodone 50 mg nightly, venlafaxine 150 mg nightly - Resumed home lorazepam 0.5 mg p.o. twice daily as needed for anxiety

## 2022-02-18 DIAGNOSIS — E669 Obesity, unspecified: Secondary | ICD-10-CM | POA: Diagnosis not present

## 2022-02-18 DIAGNOSIS — R531 Weakness: Secondary | ICD-10-CM | POA: Diagnosis not present

## 2022-02-18 DIAGNOSIS — E871 Hypo-osmolality and hyponatremia: Secondary | ICD-10-CM | POA: Diagnosis not present

## 2022-02-18 LAB — BASIC METABOLIC PANEL
Anion gap: 10 (ref 5–15)
BUN: 8 mg/dL (ref 8–23)
CO2: 27 mmol/L (ref 22–32)
Calcium: 9 mg/dL (ref 8.9–10.3)
Chloride: 96 mmol/L — ABNORMAL LOW (ref 98–111)
Creatinine, Ser: 0.76 mg/dL (ref 0.44–1.00)
GFR, Estimated: >60 mL/min (ref >60)
Glucose, Bld: 128 mg/dL — ABNORMAL HIGH (ref 70–99)
Potassium: 4.2 mmol/L (ref 3.5–5.1)
Sodium: 133 mmol/L — ABNORMAL LOW (ref 135–145)

## 2022-02-18 LAB — CBC
HCT: 38.8 % (ref 36.0–46.0)
Hemoglobin: 12.9 g/dL (ref 12.0–15.0)
MCH: 30.9 pg (ref 26.0–34.0)
MCHC: 33.2 g/dL (ref 30.0–36.0)
MCV: 93 fL (ref 80.0–100.0)
Platelets: 288 10*3/uL (ref 150–400)
RBC: 4.17 MIL/uL (ref 3.87–5.11)
RDW: 12.1 % (ref 11.5–15.5)
WBC: 9.3 10*3/uL (ref 4.0–10.5)
nRBC: 0 % (ref 0.0–0.2)

## 2022-02-18 MED ORDER — POLYETHYLENE GLYCOL 3350 17 G PO PACK
17.0000 g | PACK | Freq: Every day | ORAL | Status: DC
  Administered 2022-02-18 – 2022-02-21 (×3): 17 g via ORAL
  Filled 2022-02-18 (×7): qty 1

## 2022-02-18 MED ORDER — DOCUSATE SODIUM 100 MG PO CAPS
200.0000 mg | ORAL_CAPSULE | Freq: Two times a day (BID) | ORAL | Status: DC
  Administered 2022-02-18 – 2022-02-21 (×7): 200 mg via ORAL
  Filled 2022-02-18 (×8): qty 2

## 2022-02-18 MED ORDER — LORAZEPAM 0.5 MG PO TABS
0.5000 mg | ORAL_TABLET | Freq: Two times a day (BID) | ORAL | Status: DC
  Administered 2022-02-18 – 2022-02-26 (×15): 0.5 mg via ORAL
  Filled 2022-02-18 (×16): qty 1

## 2022-02-18 NOTE — Progress Notes (Signed)
PROGRESS NOTE    Jacqueline Williams  PQZ:[id number] DOB: 1951-06-06 DOA: 02/16/2022 PCP: Melonie Florida, FNP  Assessment & Plan:   Principal Problem:   Altered mental status Active Problems:   Hyponatremia   Anemia   Class 2 obesity   Hypertension   Anxiety and depression   Generalized weakness   Encephalopathy: possibly metabolic, possibly secondary to hyponatremia. MRI brain shows no acute intracranial abnormality. Ammonia < 10. B12 is elevated. Mental status is back to baseline   Hyponatremia: continues to trend up. Hold lasix    HTN: continue on home dose of lisinopril. Hold lasix  Generalized weakness: PT/OT recs SNF. CM is working on placement   Depression: severity unknown. Continue on home dose of venlafaxine, risperidone, & trazodone   Anxiety: severity unknown. Lorazepam prn   Peripheral neuropathy: continue on home dose of gabapentin   Hypothyroidism: continue on levothyroxine   Obesity: BMI 35.2. Would benefit from weight loss    DVT prophylaxis: lovenox Code Status: Full  Family Communication: called pt's daughter, [name], no answer so I left a voicemail  Disposition Plan: medically stable for d/c but waiting on SNF placement   Level of care: Telemetry Medical  Status is: Observation The patient remains OBS appropriate and will d/c before 2 midnights. Medically stable for d/c but waiting on SNF placement      Consultants:    Procedures:   Antimicrobials:    Subjective: Pt c/o fatigue   Objective: Vitals:   02/18/22 0006 02/18/22 0352 02/18/22 0740 02/18/22 1146  BP: (!) 159/66 (!) 141/63 (!) 143/70 (!) 115/57  Pulse: 91 92 82 86  Resp: 16 16 16 16   Temp: 99.5 F (37.5 C) 98.8 F (37.1 C) 98.2 F (36.8 C) 97.7 F (36.5 C)  TempSrc:  Oral Oral Oral  SpO2: 95% (!) 89% 93% 97%  Weight:      Height:        Intake/Output Summary (Last 24 hours) at 02/18/2022 1257 Last data filed at 02/18/2022 1033 Gross per 24 hour  Intake 480 ml   Output 1300 ml  Net -820 ml   Filed Weights   02/16/22 2025  Weight: 105.2 kg    Examination:  General exam: Appears comfortable  Respiratory system: clear breath sounds b/l  Cardiovascular system: S1/S2+. No rubs or clicks  Gastrointestinal system: Abd is soft, NT,  obese & hypoactive bowel sounds Central nervous system: alert and oriented. Moves all extremities  Psychiatry: Judgement and insight appears at baseline. Flat mood and affect    Data Reviewed: I have personally reviewed following labs and imaging studies  CBC: Recent Labs  Lab 02/16/22 1223 02/17/22 0623 02/18/22 0753  WBC 8.2 9.1 9.3  NEUTROABS 6.1  --   --   HGB 12.8 13.0 12.9  HCT 38.4 38.9 38.8  MCV 93.4 93.3 93.0  PLT 279 296 288   Basic Metabolic Panel: Recent Labs  Lab 02/16/22 1223 02/17/22 0353 02/18/22 0753  NA 127* 131* 133*  K 3.7 4.0 4.2  CL 89* 94* 96*  CO2 27 29 27   GLUCOSE 181* 134* 128*  BUN 19 12 8   CREATININE 1.08* 0.78 0.76  CALCIUM 8.7* 8.8* 9.0  MG 2.4  --   --   PHOS 2.5  --   --    GFR: Estimated Creatinine Clearance: 85.4 mL/min (by C-G formula based on SCr of 0.76 mg/dL). Liver Function Tests: Recent Labs  Lab 02/16/22 1223  AST 21  ALT 18  ALKPHOS 104  BILITOT 0.5  PROT 6.7  ALBUMIN 3.8   No results for input(s): LIPASE, AMYLASE in the last 168 hours. Recent Labs  Lab 02/17/22 0353  AMMONIA <10   Coagulation Profile: No results for input(s): INR, PROTIME in the last 168 hours. Cardiac Enzymes: No results for input(s): CKTOTAL, CKMB, CKMBINDEX, TROPONINI in the last 168 hours. BNP (last 3 results) No results for input(s): PROBNP in the last 8760 hours. HbA1C: No results for input(s): HGBA1C in the last 72 hours. CBG: No results for input(s): GLUCAP in the last 168 hours. Lipid Profile: No results for input(s): CHOL, HDL, LDLCALC, TRIG, CHOLHDL, LDLDIRECT in the last 72 hours. Thyroid Function Tests: No results for input(s): TSH, T4TOTAL,  FREET4, T3FREE, THYROIDAB in the last 72 hours. Anemia Panel: Recent Labs    02/16/22 2014  VITAMINB12 2,669*   Sepsis Labs: Recent Labs  Lab 02/16/22 1223 02/16/22 2014 02/17/22 0353  LATICACIDVEN 2.6* 0.7 1.4    Recent Results (from the past 240 hour(s))  Resp Panel by RT-PCR (Flu A&B, Covid) Nasopharyngeal Swab     Status: None   Collection Time: 02/16/22  4:12 PM   Specimen: Nasopharyngeal Swab; Nasopharyngeal(NP) swabs in vial transport medium  Result Value Ref Range Status   SARS Coronavirus 2 by RT PCR NEGATIVE NEGATIVE Final    Comment: (NOTE) SARS-CoV-2 target nucleic acids are NOT DETECTED.  The SARS-CoV-2 RNA is generally detectable in upper respiratory specimens during the acute phase of infection. The lowest concentration of SARS-CoV-2 viral copies this assay can detect is 138 copies/mL. A negative result does not preclude SARS-Cov-2 infection and should not be used as the sole basis for treatment or other patient management decisions. A negative result may occur with  improper specimen collection/handling, submission of specimen other than nasopharyngeal swab, presence of viral mutation(s) within the areas targeted by this assay, and inadequate number of viral copies(<138 copies/mL). A negative result must be combined with clinical observations, patient history, and epidemiological information. The expected result is Negative.  Fact Sheet for Patients:  BloggerCourse.com  Fact Sheet for Healthcare Providers:  SeriousBroker.it  This test is no t yet approved or cleared by the [place] FDA and  has been authorized for detection and/or diagnosis of SARS-CoV-2 by FDA under an Emergency Use Authorization (EUA). This EUA will remain  in effect (meaning this test can be used) for the duration of the COVID-19 declaration under Section 564(b)(1) of the Act, 21 U.S.C.section 360bbb-3(b)(1), unless the  authorization is terminated  or revoked sooner.       Influenza A by PCR NEGATIVE NEGATIVE Final   Influenza B by PCR NEGATIVE NEGATIVE Final    Comment: (NOTE) The Xpert Xpress SARS-CoV-2/FLU/RSV plus assay is intended as an aid in the diagnosis of influenza from Nasopharyngeal swab specimens and should not be used as a sole basis for treatment. Nasal washings and aspirates are unacceptable for Xpert Xpress SARS-CoV-2/FLU/RSV testing.  Fact Sheet for Patients: BloggerCourse.com  Fact Sheet for Healthcare Providers: SeriousBroker.it  This test is not yet approved or cleared by the [place] FDA and has been authorized for detection and/or diagnosis of SARS-CoV-2 by FDA under an Emergency Use Authorization (EUA). This EUA will remain in effect (meaning this test can be used) for the duration of the COVID-19 declaration under Section 564(b)(1) of the Act, 21 U.S.C. section 360bbb-3(b)(1), unless the authorization is terminated or revoked.  Performed at [facility], [address] [place], [place] [address]  Radiology Studies: MR BRAIN WO CONTRAST  Result Date: 02/16/2022 CLINICAL DATA:  Mental status change, unknown cause EXAM: MRI HEAD WITHOUT CONTRAST TECHNIQUE: Multiplanar, multiecho pulse sequences of the brain and surrounding structures were obtained without intravenous contrast. COMPARISON:  09/26/2017 MRI brain, correlation is also made with 01/29/2022 CT head FINDINGS: Brain: No restricted diffusion to suggest acute or subacute infarct. No acute hemorrhage, mass, mass effect, or midline shift. No extra-axial collection. Unchanged size and configuration of the ventricles, which are enlarged, similar to prior exams and favored to represent central cerebral atrophy. Vascular: Normal flow voids. Skull and upper cervical spine: Normal marrow signal. Sinuses/Orbits: Negative. Other: Fluid in the  left-greater-than-right mastoid air cells. IMPRESSION: 1. No acute intracranial process. 2. Redemonstrated ventricular prominence, which is favored to represent the sequela of central atrophy but could also be seen in the setting of normal pressure hydrocephalus, which is a clinical diagnosis. Correlate with symptoms and consider CSF tap test if clinically indicated. Electronically Signed   By: Wiliam Ke M.D.   On: 02/16/2022 23:27   US Venous Img Lower Unilateral Left (DVT)  Result Date: 02/16/2022 CLINICAL DATA:  Left leg swelling EXAM: LEFT LOWER EXTREMITY VENOUS DOPPLER ULTRASOUND TECHNIQUE: Gray-scale sonography with compression, as well as color and duplex ultrasound, were performed to evaluate the deep venous system(s) from the level of the common femoral vein through the popliteal and proximal calf veins. COMPARISON:  None. FINDINGS: VENOUS Normal compressibility of the common femoral, superficial femoral, and popliteal veins, as well as the visualized calf veins. Visualized portions of profunda femoral vein and great saphenous vein unremarkable. No filling defects to suggest DVT on grayscale or color Doppler imaging. Doppler waveforms show normal direction of venous flow, normal respiratory plasticity and response to augmentation. Limited views of the contralateral common femoral vein are unremarkable. OTHER None. Limitations: none IMPRESSION: Negative. Electronically Signed   By: Charlett Nose M.D.   On: 02/16/2022 23:19   DG Chest Portable 1 View  Result Date: 02/16/2022 CLINICAL DATA:  Weakness EXAM: PORTABLE CHEST 1 VIEW COMPARISON:  01/29/2022 FINDINGS: Artifact overlies the chest. Heart size is normal. Tortuosity of the aorta. Lungs clear except for mild linear scarring at the left base. No sign of active infiltrate, mass, effusion or collapse. IMPRESSION: Mild basilar scarring on the left. Tortuous aorta. No active disease identified. Electronically Signed   By: Paulina Fusi M.D.   On:  02/16/2022 16:19        Scheduled Meds:  divalproex  250 mg Oral BID   docusate sodium  200 mg Oral BID   enoxaparin (LOVENOX) injection  0.5 mg/kg Subcutaneous Q24H   gabapentin  100 mg Oral QHS   lactase  3,000 Units Oral TID WC   levothyroxine  25 mcg Oral Daily   lipase/protease/amylase  36,000 Units Oral TID AC   lisinopril  10 mg Oral Daily   melatonin  5 mg Oral QHS   oxybutynin  5 mg Oral BID   pantoprazole  20 mg Oral Daily   polyethylene glycol  17 g Oral Daily   risperiDONE  2 mg Oral BID   traZODone  50 mg Oral QHS   venlafaxine XR  150 mg Oral QHS   Continuous Infusions:   LOS: 0 days    Time spent: 15 mins     Charise Killian, MD [facility] Pager [phone]  If 7PM-7AM, please contact night-coverage 02/18/2022, 12:57 PM

## 2022-02-18 NOTE — TOC Progression Note (Signed)
Transition of Care [facility]) - Progression Note    Patient Details  Name: Jacqueline Williams MRN: [id number] Date of Birth: January 14, 1951  [facility]) CM/SW Contact  Maree Krabbe, LCSW Phone Number: 02/18/2022, 4:30 PM  Clinical Narrative:   [place] started through [employer] portal    Expected Discharge Plan: Skilled Nursing Facility Barriers to Discharge: Continued Medical Work up  Expected Discharge Plan and Services Expected Discharge Plan: Skilled Nursing Facility In-house Referral: Clinical Social Work     Living arrangements for the past 2 months: Assisted Living Facility                                       Social Determinants of Health (SDOH) Interventions    Readmission Risk Interventions No flowsheet data found.

## 2022-02-18 NOTE — Progress Notes (Signed)
Pt noted to have black stools this morning. No bright red blood or maroon stool. Pt asymptomatic an Dr. Jimmye Norman notified of stools.

## 2022-02-18 NOTE — TOC Progression Note (Signed)
Transition of Care [facility]) - Progression Note    Patient Details  Name: [name] MRN: [id number] Date of Birth: Aug 16, 1951  Transition of Care [facility]) CM/SW [place], RN Phone Number: 02/18/2022, 4:11 PM  Clinical Narrative:   Daughter called back and stated that they chose [facility] for SNF.  Alphia Kava notified.  Auth requested through [facility].    Expected Discharge Plan: [place] Barriers to Discharge: Continued Medical Work up  Expected Discharge Plan and Services Expected Discharge Plan: [place] In-house Referral: Clinical Social Work     Living arrangements for the past 2 months: [place]                                       Social Determinants of Health (SDOH) Interventions    Readmission Risk Interventions No flowsheet data found.

## 2022-02-18 NOTE — TOC Progression Note (Signed)
Transition of Care [facility]) - Progression Note    Patient Details  Name: Jacqueline Williams MRN: [id number] Date of Birth: 1951/02/23  [facility] Care [facility]) CM/SW Contact  Caryn Section, RN Phone Number: 02/18/2022, 9:37 AM  Clinical Narrative:   Spoke to daughter, she states that patient and family are fine with SNF.  No bed offers yet.  RNCM re sent search and included [facility].  Awaiting bed offers.  RNCM contact information given to daughter.  TOC to follow.    Expected Discharge Plan: Skilled Nursing Facility Barriers to Discharge: Continued Medical Work up  Expected Discharge Plan and Services Expected Discharge Plan: Skilled Nursing Facility In-house Referral: Clinical Social Work     Living arrangements for the past 2 months: Assisted Living Facility                                       Social Determinants of Health (SDOH) Interventions    Readmission Risk Interventions No flowsheet data found.

## 2022-02-18 NOTE — Evaluation (Signed)
Occupational Therapy Evaluation Patient Details Name: Jacqueline Williams MRN: [id number] DOB: Nov 21, 1951 Today's Date: 02/18/2022   History of Present Illness [name] is a 71 year old female with medical history of obesity, hypertension, chronic pain syndrome, GERD, neuropathy, generalized anxiety, acquired scoliosis, history of neurogenic claudication due to lumbar spinal stenosis, depression, insomnia, who presents to the emergency department from [facility] for chief concerns of altered mental status and slipped out of bed landing on her buttock, on levaquin for UTI   Clinical Impression   Ms Jacqueline Williams was seen for OT evaluation this date. Prior to hospital admission, pt was MOD I for mobility and ADLs using 4WW, assist for bathing and IADLs. Pt lives at [place]. Pt presents to acute OT demonstrating impaired ADL performance and functional mobility 2/2 decreased activity tolerance, poor insight into deficits, and functional strength/ROM/balance deficits. Pt asks NT and OT their names ~10 times over the course of session.  Pt currently requires MIN A for UB bathing in stting - pt requires BUE support 2/2 anxiousness re: falling. MOD A for BSC t/f - pt requires cues for safety, technique, and anxiety. MAX A + RW pericare in standing. Able to perform sit<>stand from [facility] with CGA + RW + cues for encouragement. NT in to assist with linen change and encourage pt. Pt would benefit from skilled OT to address noted impairments and functional limitations (see below for any additional details). Upon hospital discharge, recommend STR to maximize pt safety and return to PLOF.       Recommendations for follow up therapy are one component of a multi-disciplinary discharge planning process, led by the attending physician.  Recommendations may be updated based on patient status, additional functional criteria and insurance authorization.   Follow Up Recommendations  Skilled nursing-short term rehab (<3  hours/day)    Assistance Recommended at Discharge Frequent or constant Supervision/Assistance  Patient can return home with the following A lot of help with walking and/or transfers;A lot of help with bathing/dressing/bathroom;Help with stairs or ramp for entrance    Functional Status Assessment  Patient has had a recent decline in their functional status and demonstrates the ability to make significant improvements in function in a reasonable and predictable amount of time.  Equipment Recommendations  BSC/3in1    Recommendations for Other Services       Precautions / Restrictions Precautions Precautions: Fall Restrictions Weight Bearing Restrictions: No      Mobility Bed Mobility Overal bed mobility: Needs Assistance Bed Mobility: Supine to Sit, Sit to Supine     Supine to sit: Mod assist, HOB elevated Sit to supine: Min guard        Transfers Overall transfer level: Needs assistance   Transfers: Sit to/from Stand, Bed to chair/wheelchair/BSC Sit to Stand: Min assist Stand pivot transfers: Mod assist                Balance Overall balance assessment: Needs assistance, History of Falls Sitting-balance support: Feet supported Sitting balance-Leahy Scale: Fair     Standing balance support: Bilateral upper extremity supported, Reliant on assistive device for balance Standing balance-Leahy Scale: Fair Standing balance comment: tolerates ~2 min standing                           ADL either performed or assessed with clinical judgement   ADL Overall ADL's : Needs assistance/impaired  General ADL Comments: MIN A UB bathing in stting - pt requires BUE support 2/2 anxiousness re: falling. MOD A for BSC t/f - pt requires cues for safety, technique, and anxiety. MAX A + RW pericare in standing.      Pertinent Vitals/Pain Pain Assessment Pain Assessment: Faces Faces Pain Scale: Hurts little  more Pain Location: low back Pain Descriptors / Indicators: Discomfort, Sore Pain Intervention(s): Limited activity within patient's tolerance, Patient requesting pain meds-RN notified     Hand Dominance Right   Extremity/Trunk Assessment Upper Extremity Assessment Upper Extremity Assessment: Generalized weakness   Lower Extremity Assessment Lower Extremity Assessment: Generalized weakness       Communication Communication Communication: No difficulties   Cognition Arousal/Alertness: Awake/alert Behavior During Therapy: WFL for tasks assessed/performed, Flat affect Overall Cognitive Status: Within Functional Limits for tasks assessed                                 General Comments: Pt asks NT and OT their names ~10 times over the course of session      [place] expects to be discharged to:: Assisted living                             Home Equipment: Rollator (4 wheels)   Additional Comments: Jacqueline Williams      Prior Functioning/Environment Prior Level of Function : Independent/Modified Independent             Mobility Comments: Ambulates with Rollator ADLs Comments: assist for IADLs and bathing. Reports 2 falls in last month.        OT Problem List: Decreased strength;Decreased range of motion;Decreased activity tolerance;Impaired balance (sitting and/or standing);Decreased safety awareness      OT Treatment/Interventions: Self-care/ADL training;Therapeutic exercise;Energy conservation;DME and/or AE instruction;Therapeutic activities;Patient/family education;Balance training    OT Goals(Current goals can be found in the care plan section) Acute Rehab OT Goals Patient Stated Goal: to not fall OT Goal Formulation: With patient Time For Goal Achievement: 03/04/22 Potential to Achieve Goals: Good ADL Goals Pt Will Perform Grooming: with modified independence;sitting Pt Will Transfer to Toilet: with  supervision;ambulating;regular height toilet Pt Will Perform Toileting - Clothing Manipulation and hygiene: with supervision;sitting/lateral leans  OT Frequency: Min 2X/week    Co-evaluation              AM-PAC OT "6 Clicks" Daily Activity     Outcome Measure Help from another person eating meals?: None Help from another person taking care of personal grooming?: A Little Help from another person toileting, which includes using toliet, bedpan, or urinal?: A Lot Help from another person bathing (including washing, rinsing, drying)?: A Little Help from another person to put on and taking off regular upper body clothing?: A Little Help from another person to put on and taking off regular lower body clothing?: A Lot 6 Click Score: 17   End of Session Nurse Communication: Mobility status;Patient requests pain meds  Activity Tolerance: Patient tolerated treatment well Patient left: in bed;with call bell/phone within reach;with bed alarm set;with nursing/sitter in room  OT Visit Diagnosis: Other abnormalities of gait and mobility (R26.89);Muscle weakness (generalized) (M62.81)                Time: [id number] OT Time Calculation (min): 22 min Charges:  OT General Charges $OT Visit: 1 Visit OT Evaluation $OT Eval Low Complexity: 1 Low OT  Treatments $Self Care/Home Management : 8-22 mins  Dessie Coma, M.S. OTR/L  02/18/22, 11:06 AM  ascom [phone]

## 2022-02-18 NOTE — Progress Notes (Signed)
Physical Therapy Treatment Patient Details Name: Jacqueline Williams MRN: [id number] DOB: 10-16-1951 Today's Date: 02/18/2022   History of Present Illness [name] is a 71 year old female with medical history of obesity, hypertension, chronic pain syndrome, GERD, neuropathy, generalized anxiety, acquired scoliosis, history of neurogenic claudication due to lumbar spinal stenosis, depression, insomnia, who presents to the emergency department from [facility] for chief concerns of altered mental status and slipped out of bed landing on her buttock, on levaquin for UTI    PT Comments    Patient tolerated session well and was agreeable to treatment. Upon entry patient was long sitting in bed with a flat affect on face. Nursing students following in to give patient her pain medication. No pain reported at rest, however at conclusion of session patient reported 9/10 pain. Patient performed bed level therapeutic exercises to work on BLE strengthening. Supine to sit bed mobility requires increased assistance (At Min A- through [facility] and increased use of bed rails) compared to sit to supine (which can be competed at supervision). Sit to stand transfer was completed at SBA/Supervision, and patient was able to ambulate 1/4th of the nurses station at SBA with RW. Patient required increased encouragement to complete ambulation bout. No LOB noted, however patient walks with increased forward flexion despite cueing for upright posture. Patient is progressing towards her goals and would continue to benefit from skilled physical therapy in order to increase independence with ADLs and mobility. Continue to recommend STR upon discharge from acute hospitalization.    Recommendations for follow up therapy are one component of a multi-disciplinary discharge planning process, led by the attending physician.  Recommendations may be updated based on patient status, additional functional criteria and insurance  authorization.  Follow Up Recommendations  Skilled nursing-short term rehab (<3 hours/day)     Assistance Recommended at Discharge Frequent or constant Supervision/Assistance  Patient can return home with the following Two people to help with walking and/or transfers;A lot of help with bathing/dressing/bathroom;Assistance with cooking/housework;Direct supervision/assist for medications management;Assist for transportation;Assistance with feeding   Equipment Recommendations  None recommended by PT    Recommendations for Other Services       Precautions / Restrictions Precautions Precautions: Fall Precaution Comments: 2 recent falls Restrictions Weight Bearing Restrictions: No     Mobility  Bed Mobility Overal bed mobility: Needs Assistance Bed Mobility: Supine to Sit, Sit to Supine     Supine to sit: Min assist (through HHA and use of bed rails) Sit to supine: Supervision     Patient Response: Flat affect  Transfers Overall transfer level: Needs assistance Equipment used: Rolling walker (2 wheels) Transfers: Sit to/from Stand Sit to Stand: Supervision           General transfer comment: x2 sit to stand transfers from EOB    Ambulation/Gait Ambulation/Gait assistance:  (SBA) Gait Distance (Feet): 110 Feet Assistive device: Rolling walker (2 wheels) Gait Pattern/deviations: Step-through pattern, Decreased step length - right, Decreased step length - left, Narrow base of support, Trunk flexed Gait velocity: decreased     General Gait Details: no LOB noted, heavy reliance on RW for stability, enouragement to walk 1/4th of the nurses station   Stairs             Wheelchair Mobility    Modified Rankin (Stroke Patients Only)       Balance Overall balance assessment: Needs assistance, History of Falls Sitting-balance support: Feet supported Sitting balance-Leahy Scale: Fair Sitting balance - Comments: able  to sit edge of bed with close  supervision Postural control: Posterior lean Standing balance support: Bilateral upper extremity supported, Reliant on assistive device for balance Standing balance-Leahy Scale: Fair                              Cognition Arousal/Alertness: Awake/alert Behavior During Therapy: WFL for tasks assessed/performed, Flat affect Overall Cognitive Status: Within Functional Limits for tasks assessed                                 General Comments: Patient oriented to self, DOB, and location        Exercises General Exercises - Lower Extremity Ankle Circles/Pumps: Supine, AROM, 20 reps, Both Long Arc Quad: Seated, 10 reps, AROM, Both Heel Slides: Supine, AROM, 10 reps, Both Straight Leg Raises: Supine, AROM, 5 reps, Both Hip Flexion/Marching: Seated, 10 reps, AROM, Both    General Comments        Pertinent Vitals/Pain Pain Assessment Pain Assessment: 0-10 Pain Score: 9  Pain Location: low back Pain Descriptors / Indicators: Discomfort, Sore Pain Intervention(s): Limited activity within patient's tolerance, Monitored during session, Repositioned, RN gave pain meds during session    Home Living Family/patient expects to be discharged to:: Assisted living                 Home Equipment: Rollator (4 wheels) Additional Comments: [employer]    Prior Function            PT Goals (current goals can now be found in the care plan section) Acute Rehab PT Goals Patient Stated Goal: to return to [facility] PT Goal Formulation: With patient Time For Goal Achievement: 03/03/22 Potential to Achieve Goals: Fair Progress towards PT goals: Progressing toward goals    Frequency    Min 2X/week      PT Plan Current plan remains appropriate    Co-evaluation              AM-PAC PT "6 Clicks" Mobility   Outcome Measure  Help needed turning from your back to your side while in a flat bed without using bedrails?: A Lot Help needed moving from  lying on your back to sitting on the side of a flat bed without using bedrails?: A Lot Help needed moving to and from a bed to a chair (including a wheelchair)?: A Little Help needed standing up from a chair using your arms (e.g., wheelchair or bedside chair)?: A Little Help needed to walk in hospital room?: A Little Help needed climbing 3-5 steps with a railing? : A Lot 6 Click Score: 15    End of Session Equipment Utilized During Treatment: Gait belt Activity Tolerance: Patient tolerated treatment well Patient left: in bed;with bed alarm set;with call bell/phone within reach Nurse Communication: Mobility status PT Visit Diagnosis: Unsteadiness on feet (R26.81);Other abnormalities of gait and mobility (R26.89);Repeated falls (R29.6);Difficulty in walking, not elsewhere classified (R26.2);Muscle weakness (generalized) (M62.81)     Time: 4782-9562 PT Time Calculation (min) (ACUTE ONLY): 18 min  Charges:  $Therapeutic Exercise: 8-22 mins                     Angelica Ran, PT  02/18/22. 11:46 AM

## 2022-02-18 NOTE — TOC Progression Note (Signed)
Transition of Care [facility]) - Progression Note    Patient Details  Name: Jacqueline Williams MRN: [id number] Date of Birth: 30-Dec-1950  Transition of Care [facility]) CM/SW Contact  Caryn Section, RN Phone Number: 02/18/2022, 1:57 PM  Clinical Narrative:   Left Message with daughter as bed offers are now [facility].  Waiting for response.      Expected Discharge Plan: Skilled Nursing Facility Barriers to Discharge: Continued Medical Work up  Expected Discharge Plan and Services Expected Discharge Plan: Skilled Nursing Facility In-house Referral: Clinical Social Work     Living arrangements for the past 2 months: Assisted Living Facility                                       Social Determinants of Health (SDOH) Interventions    Readmission Risk Interventions No flowsheet data found.

## 2022-02-19 DIAGNOSIS — E669 Obesity, unspecified: Secondary | ICD-10-CM | POA: Diagnosis not present

## 2022-02-19 DIAGNOSIS — F419 Anxiety disorder, unspecified: Secondary | ICD-10-CM

## 2022-02-19 DIAGNOSIS — F32A Depression, unspecified: Secondary | ICD-10-CM | POA: Diagnosis not present

## 2022-02-19 DIAGNOSIS — R531 Weakness: Secondary | ICD-10-CM | POA: Diagnosis not present

## 2022-02-19 LAB — BASIC METABOLIC PANEL
Anion gap: 9 (ref 5–15)
BUN: 17 mg/dL (ref 8–23)
CO2: 26 mmol/L (ref 22–32)
Calcium: 8.9 mg/dL (ref 8.9–10.3)
Chloride: 98 mmol/L (ref 98–111)
Creatinine, Ser: 1.07 mg/dL — ABNORMAL HIGH (ref 0.44–1.00)
GFR, Estimated: 57 mL/min — ABNORMAL LOW (ref >60)
Glucose, Bld: 115 mg/dL — ABNORMAL HIGH (ref 70–99)
Potassium: 4.2 mmol/L (ref 3.5–5.1)
Sodium: 133 mmol/L — ABNORMAL LOW (ref 135–145)

## 2022-02-19 MED ORDER — ACETAMINOPHEN 325 MG PO TABS
650.0000 mg | ORAL_TABLET | Freq: Four times a day (QID) | ORAL | Status: AC | PRN
  Administered 2022-02-20 – 2022-02-22 (×2): 650 mg via ORAL
  Filled 2022-02-19 (×2): qty 2

## 2022-02-19 MED ORDER — ACETAMINOPHEN 650 MG RE SUPP: 650.0000 mg | Freq: Four times a day (QID) | RECTAL | Status: AC | PRN

## 2022-02-19 NOTE — Progress Notes (Signed)
PROGRESS NOTE    HPI was taken from Dr. Tobie Poet: [name] is a 71 year old female with medical history of obesity, hypertension, chronic pain syndrome, GERD, neuropathy, generalized anxiety, acquired scoliosis, history of neurogenic claudication due to lumbar spinal stenosis, depression, insomnia, who presents to the emergency department from [facility] for chief concerns of altered mental status. Initial vitals in the emergency department showed temperature 98.7, respiration rate of 18, heart rate of 78, blood pressure 143/66, SPO2 of 96% on room air.  Labs in the emergency department showed serum sodium 127, potassium 3.7, chloride 89, bicarb 27, BUN of 19, serum creatinine of 1.08, nonfasting blood glucose 181, GFR 56   ED treatment: Sodium chloride 1 L bolus.At bedside patient is able to tell me her full name, her age, the current calendar year and her current location of hospital.She states she is in the hospital because she slipped and fell.  Per EDP, mL boluses at bedside showed EDP all the missed calls that patient placed last night and voicemail asking him to bring her tea.  At bedside, patient states she does not remember any of this.She asked and confirm my name twice.  She denies any chest pain, shortness of breath, abdominal pain, dysuria, hematuria, diarrhea, syncope, loss of consciousness, trauma.At the close of my evaluation, she asked me what my name is.    Jacqueline Williams  [id number] DOB: 10-05-1951 DOA: 02/16/2022 PCP: Virgie Dad, FNP  Assessment & Plan:   Principal Problem:   Altered mental status Active Problems:   Hyponatremia   Anemia   Class 2 obesity   Hypertension   Anxiety and depression   Generalized weakness   Encephalopathy: possibly metabolic, possibly secondary to hyponatremia. MRI brain shows no acute intracranial abnormality. Ammonia < 10. B12 is elevated. Mental status is back to baseline   Hyponatremia: stable from day prior. Hold lasix    HTN: continue on home dose of lisinopril. Hold lasix  Generalized weakness: PT/OT recs SNF. Pt's insurance denied SNF. Will likely have to go home w/ [facility]   Depression: severity unknown. Continue on home dose of risperidone, venlafaxine, & trazodone    Anxiety: severity unknown. Lorazepam prn   Peripheral neuropathy: continue on home dose of gabapentin  Hypothyroidism: continue on levothyroxine   Obesity: BMI 35.2. Would benefit from weight loss     DVT prophylaxis: lovenox Code Status: Full  Family Communication: called pt's daughter, [name], no answer so I left a voicemail  Disposition Plan: medically stable for waiting to see if home facility will take pt back as pt's insurance denied SNF even after peer to peer review   Level of care: Telemetry Medical  Status is: Observation The patient remains OBS appropriate and will d/c before 2 midnights.  medically stable for waiting to see if home facility will take pt back as pt's insurance denied SNF even after peer to peer review     Consultants:    Procedures:   Antimicrobials:    Subjective: Pt c/o malaise   Objective: Vitals:   02/18/22 1538 02/18/22 1957 02/19/22 0021 02/19/22 0604  BP: (!) 115/59 139/67 129/65 133/61  Pulse: 93 92 90 80  Resp: 20 16  16   Temp: 98 F (36.7 C) 99.9 F (37.7 C) 98.6 F (37 C) 99.3 F (37.4 C)  TempSrc:  Oral    SpO2: 94% 95% 95% 94%  Weight:      Height:        Intake/Output Summary (Last  24 hours) at 02/19/2022 0726 Last data filed at 02/18/2022 1850 Gross per 24 hour  Intake 480 ml  Output 450 ml  Net 30 ml   Filed Weights   02/16/22 2025  Weight: 105.2 kg    Examination:  General exam: Appears calm & comfortable  Respiratory system: clear breath sounds b/l  Cardiovascular system: S1 & S2+. No rubs or gallops  Gastrointestinal system: Abd is soft, NT, obese & normal bowel sounds Central nervous system: alert and oriented. Moves all extremities    Psychiatry: Judgement and insight appears at baseline. Flat mood and affect    Data Reviewed: I have personally reviewed following labs and imaging studies  CBC: Recent Labs  Lab 02/16/22 1223 02/17/22 0623 02/18/22 0753  WBC 8.2 9.1 9.3  NEUTROABS 6.1  --   --   HGB 12.8 13.0 12.9  HCT 38.4 38.9 38.8  MCV 93.4 93.3 93.0  PLT 279 296 [id number]   Basic Metabolic Panel: Recent Labs  Lab 02/16/22 1223 02/17/22 0353 02/18/22 0753 02/19/22 0440  NA 127* 131* 133* 133*  K 3.7 4.0 4.2 4.2  CL 89* 94* 96* 98  CO2 27 29 27 26   GLUCOSE 181* 134* 128* 115*  BUN 19 12 8 17   CREATININE 1.08* 0.78 0.76 1.07*  CALCIUM 8.7* 8.8* 9.0 8.9  MG 2.4  --   --   --   PHOS 2.5  --   --   --    GFR: Estimated Creatinine Clearance: 63.9 mL/min (A) (by C-G formula based on SCr of 1.07 mg/dL (H)). Liver Function Tests: Recent Labs  Lab 02/16/22 1223  AST 21  ALT 18  ALKPHOS 104  BILITOT 0.5  PROT 6.7  ALBUMIN 3.8   No results for input(s): LIPASE, AMYLASE in the last 168 hours. Recent Labs  Lab 02/17/22 0353  AMMONIA <10   Coagulation Profile: No results for input(s): INR, PROTIME in the last 168 hours. Cardiac Enzymes: No results for input(s): CKTOTAL, CKMB, CKMBINDEX, TROPONINI in the last 168 hours. BNP (last 3 results) No results for input(s): PROBNP in the last 8760 hours. HbA1C: No results for input(s): HGBA1C in the last 72 hours. CBG: No results for input(s): GLUCAP in the last 168 hours. Lipid Profile: No results for input(s): CHOL, HDL, LDLCALC, TRIG, CHOLHDL, LDLDIRECT in the last 72 hours. Thyroid Function Tests: No results for input(s): TSH, T4TOTAL, FREET4, T3FREE, THYROIDAB in the last 72 hours. Anemia Panel: Recent Labs    02/16/22 2014  VITAMINB12 2,669*   Sepsis Labs: Recent Labs  Lab 02/16/22 1223 02/16/22 2014 02/17/22 0353  LATICACIDVEN 2.6* 0.7 1.4    Recent Results (from the past 240 hour(s))  Resp Panel by RT-PCR (Flu A&B, Covid)  Nasopharyngeal Swab     Status: None   Collection Time: 02/16/22  4:12 PM   Specimen: Nasopharyngeal Swab; Nasopharyngeal(NP) swabs in vial transport medium  Result Value Ref Range Status   SARS Coronavirus 2 by RT PCR NEGATIVE NEGATIVE Final    Comment: (NOTE) SARS-CoV-2 target nucleic acids are NOT DETECTED.  The SARS-CoV-2 RNA is generally detectable in upper respiratory specimens during the acute phase of infection. The lowest concentration of SARS-CoV-2 viral copies this assay can detect is 138 copies/mL. A negative result does not preclude SARS-Cov-2 infection and should not be used as the sole basis for treatment or other patient management decisions. A negative result may occur with  improper specimen collection/handling, submission of specimen other than nasopharyngeal swab, presence of viral  mutation(s) within the areas targeted by this assay, and inadequate number of viral copies(<138 copies/mL). A negative result must be combined with clinical observations, patient history, and epidemiological information. The expected result is Negative.  Fact Sheet for Patients:  EntrepreneurPulse.com.au  Fact Sheet for Healthcare Providers:  IncredibleEmployment.be  This test is no t yet approved or cleared by the [place] FDA and  has been authorized for detection and/or diagnosis of SARS-CoV-2 by FDA under an Emergency Use Authorization (EUA). This EUA will remain  in effect (meaning this test can be used) for the duration of the COVID-19 declaration under Section 564(b)(1) of the Act, 21 U.S.C.section 360bbb-3(b)(1), unless the authorization is terminated  or revoked sooner.       Influenza A by PCR NEGATIVE NEGATIVE Final   Influenza B by PCR NEGATIVE NEGATIVE Final    Comment: (NOTE) The Xpert Xpress SARS-CoV-2/FLU/RSV plus assay is intended as an aid in the diagnosis of influenza from Nasopharyngeal swab specimens and should not be  used as a sole basis for treatment. Nasal washings and aspirates are unacceptable for Xpert Xpress SARS-CoV-2/FLU/RSV testing.  Fact Sheet for Patients: EntrepreneurPulse.com.au  Fact Sheet for Healthcare Providers: IncredibleEmployment.be  This test is not yet approved or cleared by the [place] FDA and has been authorized for detection and/or diagnosis of SARS-CoV-2 by FDA under an Emergency Use Authorization (EUA). This EUA will remain in effect (meaning this test can be used) for the duration of the COVID-19 declaration under Section 564(b)(1) of the Act, 21 U.S.C. section 360bbb-3(b)(1), unless the authorization is terminated or revoked.  Performed at [facility], [address], [place], [place] [address]          Radiology Studies: No results found.      Scheduled Meds:  divalproex  250 mg Oral BID   docusate sodium  200 mg Oral BID   enoxaparin (LOVENOX) injection  0.5 mg/kg Subcutaneous Q24H   gabapentin  100 mg Oral QHS   lactase  3,000 Units Oral TID WC   levothyroxine  25 mcg Oral Daily   lipase/protease/amylase  36,000 Units Oral TID AC   lisinopril  10 mg Oral Daily   LORazepam  0.5 mg Oral BID   melatonin  5 mg Oral QHS   oxybutynin  5 mg Oral BID   pantoprazole  20 mg Oral Daily   polyethylene glycol  17 g Oral Daily   risperiDONE  2 mg Oral BID   traZODone  50 mg Oral QHS   venlafaxine XR  150 mg Oral QHS   Continuous Infusions:   LOS: 0 days    Time spent: 15 mins     Wyvonnia Dusky, MD [facility] Pager [phone]  If 7PM-7AM, please contact night-coverage 02/19/2022, 7:26 AM

## 2022-02-19 NOTE — TOC Progression Note (Addendum)
Transition of Care [facility]) - Progression Note    Patient Details  Name: Jacqueline Williams MRN: [id number] Date of Birth: 05/29/1951  [facility] CM/SW Contact  Caryn Section, RN Phone Number: 02/19/2022, 11:20 AM  Clinical Narrative:   Dr. Mayford Knife performed a peer to peer, requested by [facility].  [employer] declined to provide authorization for patient to go to SNF.  Message left for daughter, awaiting call back.  TOC  to follow.  Addendum 1342 hours:  RNCM contacted [facility], who recommended that I contact [facility].  Patient was denied for SNF, and [facility] is unsure that they can accept patient back due to level of care.  Message left for [facility].  Expected Discharge Plan: Skilled Nursing Facility Barriers to Discharge: Continued Medical Work up  Expected Discharge Plan and Services Expected Discharge Plan: Skilled Nursing Facility In-house Referral: Clinical Social Work     Living arrangements for the past 2 months: Assisted Living Facility                                       Social Determinants of Health (SDOH) Interventions    Readmission Risk Interventions No flowsheet data found.

## 2022-02-20 DIAGNOSIS — I10 Essential (primary) hypertension: Secondary | ICD-10-CM

## 2022-02-20 DIAGNOSIS — R4182 Altered mental status, unspecified: Secondary | ICD-10-CM | POA: Diagnosis not present

## 2022-02-20 DIAGNOSIS — E669 Obesity, unspecified: Secondary | ICD-10-CM | POA: Diagnosis not present

## 2022-02-20 DIAGNOSIS — R410 Disorientation, unspecified: Secondary | ICD-10-CM

## 2022-02-20 DIAGNOSIS — F419 Anxiety disorder, unspecified: Secondary | ICD-10-CM | POA: Diagnosis not present

## 2022-02-20 DIAGNOSIS — E871 Hypo-osmolality and hyponatremia: Secondary | ICD-10-CM | POA: Diagnosis not present

## 2022-02-20 LAB — BASIC METABOLIC PANEL
Anion gap: 11 (ref 5–15)
BUN: 14 mg/dL (ref 8–23)
CO2: 27 mmol/L (ref 22–32)
Calcium: 9.2 mg/dL (ref 8.9–10.3)
Chloride: 95 mmol/L — ABNORMAL LOW (ref 98–111)
Creatinine, Ser: 0.74 mg/dL (ref 0.44–1.00)
GFR, Estimated: >60 mL/min (ref >60)
Glucose, Bld: 110 mg/dL — ABNORMAL HIGH (ref 70–99)
Potassium: 4.1 mmol/L (ref 3.5–5.1)
Sodium: 133 mmol/L — ABNORMAL LOW (ref 135–145)

## 2022-02-20 MED ORDER — LORAZEPAM 1 MG PO TABS: 0.5000 mg | ORAL_TABLET | Freq: Every day | ORAL | 0 refills | Status: DC | PRN

## 2022-02-20 MED ORDER — FERROUS GLUCONATE 324 (38 FE) MG PO TABS: 324.0000 mg | ORAL_TABLET | Freq: Every day | ORAL | 3 refills | Status: AC

## 2022-02-20 MED ORDER — OXYCODONE-ACETAMINOPHEN 5-325 MG PO TABS: 1.0000 | ORAL_TABLET | Freq: Two times a day (BID) | ORAL | 0 refills | Status: DC | PRN

## 2022-02-20 MED ORDER — TRAZODONE HCL 50 MG PO TABS: 50.0000 mg | ORAL_TABLET | Freq: Every day | ORAL | Status: DC

## 2022-02-20 MED ORDER — POLYETHYLENE GLYCOL 3350 17 G PO PACK: 17.0000 g | PACK | Freq: Every day | ORAL | 0 refills | Status: DC

## 2022-02-20 MED ORDER — GABAPENTIN 100 MG PO CAPS
100.0000 mg | ORAL_CAPSULE | Freq: Every evening | ORAL | Status: DC | PRN
Start: 2022-02-20 — End: 2024-01-26

## 2022-02-20 NOTE — TOC Progression Note (Addendum)
Transition of Care (TOC) - Progression Note  ? ? ?Patient Details  ?Name: Jacqueline Williams ?MRN: [id number] ?Date of Birth: 1951-10-26 ? ?Transition of Care (TOC) CM/SW Contact  ?Caryn Section, RN ?Phone Number: ?02/20/2022, 9:34 AM ? ?Clinical Narrative:   Patient was denied for SNF by [facility].  RNCM spoke to [facility], who will assist to coordinate care at [facility].  [facility] will provide Home Health at facility as per [place].  Message left for daughter on 2/28 and 03/01, awaiting return call.  TOC to follow. ? ?Addendum 1052 hours:  Daughter would like to appeal SNF decision with [employer].  [employer] contact information provided to patient's daughter.  TOC will follow. ? ?Expected Discharge Plan: Skilled Nursing Facility ?Barriers to Discharge: Continued Medical Work up ? ?Expected Discharge Plan and Services ?Expected Discharge Plan: Skilled Nursing Facility ?In-house Referral: Clinical Social Work ?  ?  ?Living arrangements for the past 2 months: Assisted Living Facility ?                ?  ?  ?  ?  ?  ?  ?  ?  ?  ?  ? ? ?Social Determinants of Health (SDOH) Interventions ?  ? ?Readmission Risk Interventions ?No flowsheet data found. ? ?

## 2022-02-20 NOTE — Progress Notes (Signed)
Occupational Therapy Treatment ?Patient Details ?Name: Jacqueline Williams ?MRN: [id number] ?DOB: [birth year] ?Today's Date: 02/20/2022 ? ? ?History of present illness Jacqueline Williams is a 71 year old female with medical history of obesity, hypertension, chronic pain syndrome, GERD, neuropathy, generalized anxiety, acquired scoliosis, history of neurogenic claudication due to lumbar spinal stenosis, depression, insomnia, who presents to the emergency department from [facility] for chief concerns of altered mental status and slipped out of bed landing on her buttock, on levaquin for UTI ?  ?OT comments ? Jacqueline Williams was seen for OT treatment on this date. Upon arrival to room pt reclined in chair, reporting pain in back and requesting return to bed. Pt requires MIN A + RW for toilet t/f - pt requires assist to manage RW as she repeatedly keeps outside BOS despite MAX cues. SETUP + SUPERVISION seated grooming tasks. MOD A don underwear, assist for threading over BLE and pulling up over rear. Pt making good progress toward goals. Pt continues to benefit from skilled OT services to maximize return to PLOF and minimize risk of future falls, injury, caregiver burden, and readmission. Will continue to follow POC. Discharge recommendation remains appropriate.  ?  ? ?Recommendations for follow up therapy are one component of a multi-disciplinary discharge planning process, led by the attending physician.  Recommendations may be updated based on patient status, additional functional criteria and insurance authorization. ?   ?Follow Up Recommendations ? Skilled nursing-short term rehab (<3 hours/day)  ?  ?Assistance Recommended at Discharge Intermittent Supervision/Assistance  ?Patient can return home with the following ? A lot of help with walking and/or transfers;A lot of help with bathing/dressing/bathroom;Help with stairs or ramp for entrance ?  ?Equipment Recommendations ? BSC/3in1  ?  ?Recommendations for Other Services   ? ?   ?Precautions / Restrictions Precautions ?Precautions: Fall ?Precaution Comments: 2 recent falls ?Restrictions ?Weight Bearing Restrictions: No  ? ? ?  ? ?Mobility Bed Mobility ?Overal bed mobility: Needs Assistance ?Bed Mobility: Sit to Supine ?  ?  ?  ?Sit to supine: Supervision ?  ?  ?  ? ?Transfers ?Overall transfer level: Needs assistance ?Equipment used: Rolling walker (2 wheels) ?Transfers: Sit to/from Stand ?Sit to Stand: Min guard ?  ?  ?  ?  ?  ?  ?  ?  ?Balance Overall balance assessment: Needs assistance, History of Falls ?Sitting-balance support: Feet supported ?Sitting balance-Leahy Scale: Fair ?  ?  ?Standing balance support: Single extremity supported, During functional activity ?Standing balance-Leahy Scale: Fair ?  ?  ?  ?  ?  ?  ?  ?  ?  ?  ?  ?  ?   ? ?ADL either performed or assessed with clinical judgement  ? ?ADL Overall ADL's : Needs assistance/impaired ?  ?  ?  ?  ?  ?  ?  ?  ?  ?  ?  ?  ?  ?  ?  ?  ?  ?  ?  ?General ADL Comments: MIN A + RW for toilet t/f - pt requires assist to manage RW as she repeatedly keeps outside BOS despite MAX cues. SETUP + SUPERVISION seated grooming tasks. MOD A don underwear, assist for threading over BLE and pulling up over rear. ?  ? ? ? ?Cognition Arousal/Alertness: Awake/alert ?Behavior During Therapy: [facility] for tasks assessed/performed, Flat affect ?Overall Cognitive Status: Within Functional Limits for tasks assessed ?  ?  ?  ?  ?  ?  ?  ?  ?  ?  ?  ?  ?  ?  ?  ?  ?  General Comments: O-log 30/30 ?  ?  ?   ?   ?   ? ?Pertinent Vitals/ Pain       Pain Assessment ?Pain Assessment: 0-10 ?Pain Score: 7  ?Pain Location: low back ?Pain Descriptors / Indicators: Discomfort, Sore ?Pain Intervention(s): Limited activity within patient's tolerance, Repositioned ? ? ?Frequency ? Min 2X/week  ? ? ? ? ?  ?Progress Toward Goals ? ?OT Goals(current goals can now be found in the care plan section) ? Progress towards OT goals: Progressing toward goals ? ?Acute Rehab OT  Goals ?Patient Stated Goal: to not fall ?OT Goal Formulation: With patient ?Time For Goal Achievement: 03/04/22 ?Potential to Achieve Goals: Good ?ADL Goals ?Pt Will Perform Grooming: with modified independence;sitting ?Pt Will Transfer to Toilet: with supervision;ambulating;regular height toilet ?Pt Will Perform Toileting - Clothing Manipulation and hygiene: with supervision;sitting/lateral leans  ?Plan Discharge plan remains appropriate;Frequency remains appropriate   ? ?Co-evaluation ? ? ?   ?  ?  ?  ?  ? ?  ?AM-PAC OT "6 Clicks" Daily Activity     ?Outcome Measure ? ? Help from another person eating meals?: None ?Help from another person taking care of personal grooming?: A Little ?Help from another person toileting, which includes using toliet, bedpan, or urinal?: A Lot ?Help from another person bathing (including washing, rinsing, drying)?: A Lot ?Help from another person to put on and taking off regular upper body clothing?: A Little ?Help from another person to put on and taking off regular lower body clothing?: A Lot ?6 Click Score: 16 ? ?  ?End of Session Equipment Utilized During Treatment: Rolling walker (2 wheels) ? ?OT Visit Diagnosis: Other abnormalities of gait and mobility (R26.89);Muscle weakness (generalized) (M62.81) ?  ?Activity Tolerance Patient tolerated treatment well ?  ?Patient Left in bed;with call bell/phone within reach;with bed alarm set ?  ?Nurse Communication Mobility status ?  ? ?   ? ?Time: 2703-5009 ?OT Time Calculation (min): 25 min ? ?Charges: OT General Charges ?$OT Visit: 1 Visit ?OT Treatments ?$Self Care/Home Management : 23-37 mins ? ?Kathie Dike, M.S. OTR/L  ?02/20/22, 2:36 PM  ?ascom [phone] ? ?

## 2022-02-20 NOTE — Progress Notes (Signed)
Mobility Specialist - Progress Note ? ? 02/20/22 1000  ?Mobility  ?Activity Transferred from bed to chair  ?Level of Assistance Standby assist, set-up cues, supervision of patient - no hands on  ?Assistive Device Front wheel walker  ?Distance Ambulated (ft) 2 ft  ?Activity Response Tolerated well  ?$Mobility charge 1 Mobility  ? ? ?Pt lying in bed upon arrival, utilizing RA. Pt voiced 8/10 pain in back. Expressive of fear of falling, encouragement provided. Extra time to achieve EOB with heavy use of bed rails. VC for hand placement to stand with supervision. PT entered mid-session and provided vc for corrective posture and pacing of activity. Pt dizzy with transfer and seated rest break taken. Pt left in chair with PT present at exit.  ? ? ?Filiberto Pinks ?Mobility Specialist ?02/20/22, 11:08 AM ? ? ? ?

## 2022-02-20 NOTE — Progress Notes (Addendum)
?PROGRESS NOTE ? ?Jacqueline Williams    DOB: August 26, 1951, 71 y.o.  ?LNL:[id number]  ?  Code Status: Full Code   ?DOA: 02/16/2022   LOS: 0  ? ?Brief hospital course  ?Jacqueline Williams is a 71 y.o. female with a PMH significant for HTN, chronic pain syndrome, GERD, neuropathy, GAD, acquired scoliosis, obesity, history of neurogenic claudication due to lumbar spinal stenosis, depression, insomnia. ?They presented from [facility] to the ED on 02/16/2022 with AMS x 1 days. ?In the ED, it was found that they had alert and oriented x3 but fluctuating confusion and memory loss.  Additionally, she was found to be hyponatremic with sodium of 127 thought to be related to recent antibiotic use in combination of furosemide. ?Head imaging was negative for acute process including MRI. ?They were treated with 1 L bolus of NS.  ?Patient was admitted to medicine service for further workup and management of AMS as outlined in detail below. ? ?02/21/22 -stable, baseline. Medically ready for dc to SNF when bed available.  ? ?Assessment & Plan  ?Principal Problem: ?  Altered mental status ?Active Problems: ?  Hyponatremia ?  Anemia ?  Class 2 obesity ?  Hypertension ?  Anxiety and depression ?  Generalized weakness ? ?Acute metabolic encephalopathy-resolved.  Patient states she is back at her baseline. Home sedative medications decreased/modified as listed below. ?- PT/OT recommending SNF ? ?Hyponatremia- Na+ stable at 133 today ?- BMP am ? ?HTN-chronic, stable ?-Continue home lisinopril ? ?GAD  depression  insomnia- chronic, stable. Without behavioral disturbances ?- decreased trazodone, lorazepam ? ?H/o overactive bladder- asymptomatic ?- discontinued oxybutynin ?- monitor for symptoms ? ?H/o Anemia- hgb has been stable within normal range ? ?Body mass index is 35.28 kg/m?. ? ?VTE ppx: Place TED hose Start: 02/16/22 1832 ? ? ?Diet:  ?   ?Diet  ? Diet Heart Room service appropriate? Yes; Fluid consistency: Thin  ? ?Subjective 02/20/22    ? ?Pt reports feeling well today. She has no complaints at this ime.  ?  ?Objective  ? ?Vitals:  ? 02/19/22 1632 02/19/22 1947 02/20/22 0101 02/20/22 0435  ?BP: 132/60 139/69 138/64 (!) 163/74  ?Pulse: 88 93 87 91  ?Resp: 18 18 17 18   ?Temp: 98.7 ?F (37.1 ?C) 98.8 ?F (37.1 ?C) 99.2 ?F (37.3 ?C) 99.2 ?F (37.3 ?C)  ?TempSrc: Oral Oral Oral Oral  ?SpO2: 96% 98% 95% 97%  ?Weight:      ?Height:      ? ? ?Intake/Output Summary (Last 24 hours) at 02/20/2022 0709 ?Last data filed at 02/20/2022 0435 ?Gross per 24 hour  ?Intake --  ?Output 500 ml  ?Net -500 ml  ? ?Filed Weights  ? 02/16/22 2025  ?Weight: 105.2 kg  ?  ? ?Physical Exam:  ?General: awake, alert, NAD ?HEENT: atraumatic, clear conjunctiva, anicteric sclera, MMM, hearing grossly normal ?Respiratory: normal respiratory effort. ?Cardiovascular: normal S1/S2, RRR, no JVD, murmurs, quick capillary refill  ?Nervous: A&O x3. no gross focal neurologic deficits, normal speech ?Extremities: moves all equally, no edema, normal tone ?Skin: dry, intact, normal temperature, normal color. No rashes, lesions or ulcers on exposed skin ?Psychiatry: flat mood, congruent affect ? ?Labs   ?I have personally reviewed the following labs and imaging studies ?CBC ?   ?Component Value Date/Time  ? WBC 9.3 02/18/2022 0753  ? RBC 4.17 02/18/2022 0753  ? HGB 12.9 02/18/2022 0753  ? HGB 11.3 (L) 04/28/2013 2126  ? HCT 38.8 02/18/2022 0753  ? HCT  32.9 (L) 04/28/2013 2126  ? PLT 288 02/18/2022 0753  ? PLT 339 04/28/2013 2126  ? MCV 93.0 02/18/2022 0753  ? MCV 94 04/28/2013 2126  ? MCH 30.9 02/18/2022 0753  ? MCHC 33.2 02/18/2022 0753  ? RDW 12.1 02/18/2022 0753  ? RDW 14.6 (H) 04/28/2013 2126  ? LYMPHSABS 1.2 02/16/2022 1223  ? MONOABS 0.9 02/16/2022 1223  ? EOSABS 0.0 02/16/2022 1223  ? BASOSABS 0.0 02/16/2022 1223  ? ?BMP Latest Ref Rng & Units 02/19/2022 02/18/2022 02/17/2022  ?Glucose 70 - 99 mg/dL 767(M) 094(B) 096(G)  ?BUN 8 - 23 mg/dL 17 8 12   ?Creatinine 0.44 - 1.00 mg/dL ) 8.36(O 2.94   ?Sodium 135 - 145 mmol/L 133(L) 133(L) 131(L)  ?Potassium 3.5 - 5.1 mmol/L 4.2 4.2 4.0  ?Chloride 98 - 111 mmol/L 98 96(L) 94(L)  ?CO2 22 - 32 mmol/L 26 27 29   ?Calcium 8.9 - 10.3 mg/dL 8.9 9.0 7.65)  ? ? ?No results found. ? ?Disposition Plan & Communication  ?Patient status: Observation  ?Admitted From: Home ?Planned disposition location: Skilled nursing facility ?Anticipated discharge date: TBD pending safe dispo plan. Family appealing insurance denial of SNF ? ?Family Communication: none  ?  ?Author: ? , DO ?[facility] ?02/20/2022, 7:09 AM  ? ?Available by Epic secure chat 7AM-7PM. ?If 7PM-7AM, please contact night-coverage.  ?[facility] contact information found on [employer]. ? ?

## 2022-02-20 NOTE — Plan of Care (Signed)

## 2022-02-20 NOTE — TOC Progression Note (Addendum)
Transition of Care (TOC) - Progression Note  ? ? ?Patient Details  ?Name: Jacqueline Williams ?MRN: [id number] ?Date of Birth: 01-03-1951 ? ?Transition of Care (TOC) CM/SW Contact  ?[name], RN ?Phone Number: ?02/20/2022, 3:48 PM ? ?Clinical Narrative:    ?Spoke to daughter, Jacqueline Williams who reports she had spoken to [facility] after SNF denial and was told she could not start appeal and all hospital had to do was send "correct codes/terminology".  ? ?RN CM outreached to [facility] and confirmed no record of conversation with daughter and unclear of who told her this information. [employer] did start SNF appeal and requested clinical be faxed to [phone]. Appeal # [id number].  ? ?RN CM faxed all requested information for SNF appeal.  ? ?Updated [name], daughter of appeal started.  ? ? ?Expected Discharge Plan: Skilled Nursing Facility ?Barriers to Discharge: Continued Medical Work up ? ?Expected Discharge Plan and Services ?Expected Discharge Plan: Skilled Nursing Facility ?In-house Referral: Clinical Social Work ?  ?  ?Living arrangements for the past 2 months: Assisted Living Facility ?Expected Discharge Date: 02/20/22               ?  ?  ?  ?  ?  ?  ?  ?  ?  ?  ? ? ?Social Determinants of Health (SDOH) Interventions ?  ? ?Readmission Risk Interventions ?No flowsheet data found. ? ?

## 2022-02-20 NOTE — Progress Notes (Addendum)
Physical Therapy Treatment ?Patient Details ?Name: Jacqueline Williams ?MRN: [id number] ?DOB: 05-Jan-1951 ?Today's Date: 02/20/2022 ? ? ?History of Present Illness Jacqueline Williams is a 71 year old female with medical history of obesity, hypertension, chronic pain syndrome, GERD, neuropathy, generalized anxiety, acquired scoliosis, history of neurogenic claudication due to lumbar spinal stenosis, depression, insomnia, who presents to the emergency department from [facility] for chief concerns of altered mental status and slipped out of bed landing on her buttock, on levaquin for UTI ? ?  ?PT Comments  ? ? Pt sitting EOB working w/ mobility specialist upon PT entrance into room today. She is able to complete sit to stand w/ CGA using RW; requires verbal cues about standing all the way up and making sure to "see all 5'7" of her" before trying to walk to make sure she is using her leg strength. She is able to transfer to recliner w/ CGA and RW prior to continuation of session and due to c/o dizziness. BP obtained due to c/o dizziness (see "general comments" section); Pt was able to complete 3 sets of 5x sit to stand w/ SUPERVISION. She was able to complete the last set of 5 in 1 minute and 25 seconds. Pt will benefit from continued skilled PT in order to increase LE strength, improve mobility/gait, and restore PLOF. Current discharge recommendation remains appropriate due to the level of assistance required by the patient to ensure safety and improve overall function. If pt to discharge home, recommend RW, and near 24/7 supervision/assistance as well as HHPT. ?  ?Recommendations for follow up therapy are one component of a multi-disciplinary discharge planning process, led by the attending physician.  Recommendations may be updated based on patient status, additional functional criteria and insurance authorization. ? ?Follow Up Recommendations ? Skilled nursing-short term rehab (<3 hours/day) ?  ?  ?Assistance Recommended at  Discharge Frequent or constant Supervision/Assistance  ?Patient can return home with the following A little help with walking and/or transfers;A little help with bathing/dressing/bathroom;Assistance with cooking/housework;Assist for transportation;Help with stairs or ramp for entrance ?  ?Equipment Recommendations ? Rolling walker (2 wheels)  ?  ?Recommendations for Other Services   ? ? ?  ?Precautions / Restrictions Precautions ?Precautions: Fall ?Restrictions ?Weight Bearing Restrictions: No  ?  ? ?Mobility ? Bed Mobility ?  ?  ?  ?  ?  ?  ?  ?  ?  ? ?Transfers ?Overall transfer level: Needs assistance ?Equipment used: Rolling walker (2 wheels) ?Transfers: Sit to/from Stand ?Sit to Stand: Min guard ?Stand pivot transfers: Min guard ?  ?  ?  ?  ?  ?  ? ?Ambulation/Gait ?  ?  ?  ?  ?  ?  ?  ?  ? ? ?Stairs ?  ?  ?  ?  ?  ? ? ?Wheelchair Mobility ?  ? ?Modified Rankin (Stroke Patients Only) ?  ? ? ?  ?Balance   ?  ?  ?  ?  ?Standing balance support: Bilateral upper extremity supported, Reliant on assistive device for balance ?Standing balance-Leahy Scale: Fair ?  ?  ?  ?  ?  ?  ?  ?  ?  ?  ?  ?  ?  ? ?  ?Cognition Arousal/Alertness: Awake/alert ?Behavior During Therapy: [facility] for tasks assessed/performed, Flat affect ?Overall Cognitive Status: Within Functional Limits for tasks assessed ?  ?  ?  ?  ?  ?  ?  ?  ?  ?  ?  ?  ?  ?  ?  ?  ?  General Comments: Patient oriented to self, DOB, and location ?  ?  ? ?  ?Exercises Other Exercises ?Other Exercises: 3 sets of 5x sit to stand ? ?  ?General Comments General comments (skin integrity, edema, etc.): Sitting: BP: 125/49 mmHg; Sitting after 3x5 sit to stands: BP: 123/59 mmHg ?  ?  ? ?Pertinent Vitals/Pain Pain Assessment ?Pain Assessment: Faces ?Faces Pain Scale: Hurts little more ?Pain Location: low back ?Pain Descriptors / Indicators: Discomfort, Sore ?Pain Intervention(s): Limited activity within patient's tolerance, Monitored during session, Repositioned, Patient  requesting pain meds-RN notified  ? ? ?Home Living   ?  ?  ?  ?  ?  ?  ?  ?  ?  ?   ?  ?Prior Function    ?  ?  ?   ? ?PT Goals (current goals can now be found in the care plan section) Progress towards PT goals: Progressing toward goals ? ?  ?Frequency ? ? ? Min 2X/week ? ? ? ?  ?PT Plan Current plan remains appropriate  ? ? ?Co-evaluation   ?  ?  ?  ?  ? ?  ?AM-PAC PT "6 Clicks" Mobility   ?Outcome Measure ? Help needed turning from your back to your side while in a flat bed without using bedrails?: A Little ?Help needed moving from lying on your back to sitting on the side of a flat bed without using bedrails?: A Little ?Help needed moving to and from a bed to a chair (including a wheelchair)?: A Little ?Help needed standing up from a chair using your arms (e.g., wheelchair or bedside chair)?: A Little ?Help needed to walk in hospital room?: A Little ?Help needed climbing 3-5 steps with a railing? : A Lot ?6 Click Score: 17 ? ?  ?End of Session   ?Activity Tolerance: Patient tolerated treatment well ?Patient left: with call bell/phone within reach;in chair;with chair alarm set;with nursing/sitter in room ?Nurse Communication: Mobility status ?PT Visit Diagnosis: Unsteadiness on feet (R26.81);Other abnormalities of gait and mobility (R26.89);Repeated falls (R29.6);Difficulty in walking, not elsewhere classified (R26.2);Muscle weakness (generalized) (M62.81) ?  ? ? ?Time: 1053-1106 ?PT Time Calculation (min) (ACUTE ONLY): 13 min ? ?Charges:             ?          ? ? ?Jeralyn Bennett, SPT ?02/20/2022, 12:24 PM ? ?

## 2022-02-20 NOTE — Discharge Summary (Signed)
Physician Discharge Summary  Patient: Jacqueline Williams ZOX:[id number] DOB: 1951/11/21   Code Status: Full Code Admit date: 02/16/2022 Discharge date: 02/20/2022 Disposition: Skilled nursing facility, PT and OT PCP: Melonie Florida, FNP  Recommendations for Outpatient Follow-up:  Follow up with PCP within 1-2 weeks Patient is on several scheduled and PRN sedative medications that likely contributed to her altered mental status.  Please see medication reconciliation below for changes to those home medications in effort to decrease her sedation and fall risk.  Also discontinued oxybutynin. Consider myrbetriq, pelvic PT if still symptomatic.  Please monitor metabolic panel for sodium level  Discharge Diagnoses:  Principal Problem:   Altered mental status Active Problems:   Hyponatremia   Anemia   Class 2 obesity   Hypertension   Anxiety and depression   Generalized weakness  Brief Hospital Course Summary: JIM PHILEMON is a 71 y.o. female with a PMH significant for HTN, chronic pain syndrome, GERD, neuropathy, GAD, acquired scoliosis, obesity, history of neurogenic claudication due to lumbar spinal stenosis, severe depression, insomnia. They presented from [facility] to the ED on 02/16/2022 with AMS x 1 days. In the ED, it was found that they were alert and oriented x3 but fluctuating confusion and memory loss. Her mental status appeared to be close to baseline according to the patient and report at time of discharge. Overall, she remained stable. Head imaging was negative for acute process including MRI. The most likely explanation of her presenting symptoms were medication interactions/effects. Several modifications were made to her home sedating and cognitive altering medications at time of discharge as can bee seen in reconciliation below.   Additionally, she was found to be hyponatremic with sodium of 127 on admission thought to be related to recent antibiotic use in combination of  furosemide which was held during admission. They were treated with 1 L bolus of NS. Her Na+ responded well and was stable for several days a 133.   Discharge Condition: Good, improved Recommended discharge diet: Regular healthy diet  Consultations: None   Procedures/Studies: Brain MRI   Allergies as of 02/20/2022       Reactions   Other Shortness Of Breath   caffergot   Penicillin G Sodium Anaphylaxis   Aspirin Other (See Comments)   S/p Gastric bypass   Ergotamine Tartrate Other (See Comments)   Ibuprofen Other (See Comments)   S/p gastric bypass   Ketorolac Tromethamine Nausea Only   Naproxen Nausea Only   Nsaids    Other reaction(s): Other (See Comments) GI bleed   Olanzapine Other (See Comments)   Oxaprozin Other (See Comments)   Penicillins    Seroquel [quetiapine Fumarate] Other (See Comments)   hallucinations        Medication List     STOP taking these medications    1st Medx-Patch/ Lidocaine 4-0.374-05-11 % Ptch Generic drug: Lido-Capsaicin-Men-Methyl Sal   cetirizine 10 MG tablet Commonly known as: ZYRTEC   furosemide 20 MG tablet Commonly known as: LASIX   loperamide 2 MG capsule Commonly known as: IMODIUM   Macrobid 100 MG capsule Generic drug: nitrofurantoin (macrocrystal-monohydrate)   magnesium oxide 400 MG tablet Commonly known as: MAG-OX   oxybutynin 5 MG tablet Commonly known as: DITROPAN   pantoprazole 20 MG tablet Commonly known as: PROTONIX   pantoprazole 40 MG tablet Commonly known as: PROTONIX   potassium chloride 10 MEQ CR capsule Commonly known as: MICRO-K   promethazine 25 MG tablet Commonly known as: PHENERGAN  sucralfate 1 g tablet Commonly known as: CARAFATE       TAKE these medications    acetaminophen 325 MG tablet Commonly known as: TYLENOL Take 650 mg by mouth every 6 (six) hours as needed.   Creon 24000-76000 units Cpep Generic drug: Pancrelipase (Lip-Prot-Amyl) Take 48,000 Units by mouth 3  (three) times daily before meals.   cyanocobalamin 1000 MCG/ML injection Commonly known as: (VITAMIN B-12) Inject 1 mL into the skin every 14 (fourteen) days.   Depo-Estradiol 5 MG/ML injection Generic drug: estradiol cypionate Inject 0.4 mg into the muscle every 28 (twenty-eight) days.   divalproex 250 MG DR tablet Commonly known as: DEPAKOTE Take 250 mg by mouth 2 (two) times daily.   ferrous gluconate 324 MG tablet Commonly known as: FERGON Take 1 tablet (324 mg total) by mouth daily with breakfast. What changed: when to take this   fluticasone 50 MCG/ACT nasal spray Commonly known as: FLONASE Place 1 spray into both nostrils daily as needed for allergies or rhinitis.   folic acid 1 MG tablet Commonly known as: FOLVITE Take 1 mg by mouth daily.   gabapentin 100 MG capsule Commonly known as: NEURONTIN Take 1 capsule (100 mg total) by mouth at bedtime as needed. What changed:  when to take this reasons to take this   lactase 3000 units tablet Commonly known as: LACTAID Take 3,000 Units by mouth 3 (three) times daily with meals.   levothyroxine 25 MCG tablet Commonly known as: SYNTHROID Take 25 mcg by mouth daily.   lisinopril 10 MG tablet Commonly known as: ZESTRIL Take 10 mg by mouth daily.   LORazepam 1 MG tablet Commonly known as: ATIVAN Take 0.5 tablets (0.5 mg total) by mouth daily as needed for anxiety. What changed: when to take this   ondansetron 4 MG tablet Commonly known as: ZOFRAN Take 4 mg by mouth every 8 (eight) hours as needed.   oxyCODONE-acetaminophen 5-325 MG tablet Commonly known as: PERCOCET/ROXICET Take 1 tablet by mouth every 12 (twelve) hours as needed for severe pain. 0630,1100,1600,2030 What changed: when to take this   polyethylene glycol 17 g packet Commonly known as: MIRALAX / GLYCOLAX Take 17 g by mouth daily. Start taking on: February 21, 2022   RA Melatonin 10 MG Tabs Generic drug: Melatonin Take 10 tablets by mouth at  bedtime.   risperiDONE 2 MG tablet Commonly known as: RISPERDAL Take 2 mg by mouth 2 (two) times daily.   simethicone 80 MG chewable tablet Commonly known as: MYLICON Chew 80 mg by mouth every 6 (six) hours as needed for flatulence.   traZODone 50 MG tablet Commonly known as: DESYREL Take 1 tablet (50 mg total) by mouth at bedtime. What changed: medication strength   venlafaxine XR 150 MG 24 hr capsule Commonly known as: EFFEXOR-XR Take 150 mg by mouth at bedtime.        Contact information for after-discharge care     Destination     [facility] .   Service: Skilled Nursing Contact information: [address] [place] [address] [phone]                     Subjective   Pt reports feeling fine. She has no complaints at this time. She remembers being confused on admission but doesn't know why. She feels that she is back to her normal self today.  Objective  Blood pressure 130/60, pulse 83, temperature 98 F (36.7 C), temperature  source Oral, resp. rate 18, height 5\' 8"  (1.727 m), weight 105.2 kg, SpO2 97 %.   General: Pt is alert, awake, not in acute distress Cardiovascular: RRR, S1/S2 +, no rubs, no gallops Respiratory: CTA bilaterally, no wheezing, no rhonchi Abdominal: Soft, NT, ND, bowel sounds + Extremities: no edema, no cyanosis Psych: mood is normal. Flat affect. Possesses capacity.    The results of significant diagnostics from this hospitalization (including imaging, microbiology, ancillary and laboratory) are listed below for reference.   Imaging studies: DG Lumbar Spine Complete  Result Date: 01/29/2022 CLINICAL DATA:  Fall yesterday. EXAM: LUMBAR SPINE - COMPLETE 4+ VIEW COMPARISON:  01/24/2021 FINDINGS: Mild anterolisthesis L2-3 and L3-4 unchanged. Pedicle screw and interbody fusion L5-S1 unchanged. Anterolisthesis L5-S1 unchanged Negative for fracture.  No skeletal lesion. IMPRESSION:  Negative for lumbar fracture. Electronically Signed   By: Marlan Palau M.D.   On: 01/29/2022 11:38   CT HEAD WO CONTRAST ( )  Result Date: 01/29/2022 CLINICAL DATA:  Neck trauma (Age >= 65y); Mental status change, unknown cause. Unable to stand. Fall yesterday. Slurred speech. EXAM: CT HEAD WITHOUT CONTRAST CT CERVICAL SPINE WITHOUT CONTRAST TECHNIQUE: Multidetector CT imaging of the head and cervical spine was performed following the standard protocol without intravenous contrast. Multiplanar CT image reconstructions of the cervical spine were also generated. RADIATION DOSE REDUCTION: This exam was performed according to the departmental dose-optimization program which includes automated exposure control, adjustment of the mA and/or kV according to patient size and/or use of iterative reconstruction technique. COMPARISON:  Head CT 01/24/2021.  Head MRI 09/26/2017. FINDINGS: CT HEAD FINDINGS Brain: There is no evidence of an acute infarct, intracranial hemorrhage, mass, midline shift, or extra-axial fluid collection. Mild chronic enlargement of the lateral ventricles is unchanged from the prior CT and favored to reflect central predominant cerebral atrophy over hydrocephalus. Vascular: No hyperdense vessel. Skull: No acute fracture or suspicious osseous lesion. Sinuses/Orbits: Visualized paranasal sinuses are clear. Trace left mastoid effusion. Unremarkable orbits. Other: None. CT CERVICAL SPINE FINDINGS Alignment: Straightening of the normal cervical lordosis. Mild right convex curvature of the cervical spine. No evidence of traumatic subluxation. Skull base and vertebrae: No acute fracture or suspicious osseous lesion. Tiny bony defect in the right C1 transverse foramen has a chronic or developmental appearance. Soft tissues and spinal canal: No prevertebral fluid or swelling. No visible canal hematoma. Disc levels: Moderate disc degeneration at C5-6 and C6-7 with uncovertebral spurring resulting in neural  foraminal stenosis which is mild-to-moderate on the left at C6-7. Moderate-sized central disc extrusion at C4-5 resulting in mild-to-moderate spinal stenosis. Upper chest: Clear lung apices. Partially visualized patulous esophagus containing fluid which may reflect dysmotility or reflux. Other: 1.7 cm peripherally calcified left thyroid nodule. IMPRESSION: 1. No evidence of acute intracranial abnormality. 2. No acute cervical spine fracture. 3. 1.7 cm incidental left thyroid nodule. Recommend thyroid US. Reference: J Am Coll Radiol. 2015 Feb;12(2): 143-50 Electronically Signed   By: Sebastian Ache M.D.   On: 01/29/2022 11:11   CT Cervical Spine Wo Contrast  Result Date: 01/29/2022 CLINICAL DATA:  Neck trauma (Age >= 65y); Mental status change, unknown cause. Unable to stand. Fall yesterday. Slurred speech. EXAM: CT HEAD WITHOUT CONTRAST CT CERVICAL SPINE WITHOUT CONTRAST TECHNIQUE: Multidetector CT imaging of the head and cervical spine was performed following the standard protocol without intravenous contrast. Multiplanar CT image reconstructions of the cervical spine were also generated. RADIATION DOSE REDUCTION: This exam was performed according to the departmental dose-optimization program which includes automated exposure  control, adjustment of the mA and/or kV according to patient size and/or use of iterative reconstruction technique. COMPARISON:  Head CT 01/24/2021.  Head MRI 09/26/2017. FINDINGS: CT HEAD FINDINGS Brain: There is no evidence of an acute infarct, intracranial hemorrhage, mass, midline shift, or extra-axial fluid collection. Mild chronic enlargement of the lateral ventricles is unchanged from the prior CT and favored to reflect central predominant cerebral atrophy over hydrocephalus. Vascular: No hyperdense vessel. Skull: No acute fracture or suspicious osseous lesion. Sinuses/Orbits: Visualized paranasal sinuses are clear. Trace left mastoid effusion. Unremarkable orbits. Other: None. CT  CERVICAL SPINE FINDINGS Alignment: Straightening of the normal cervical lordosis. Mild right convex curvature of the cervical spine. No evidence of traumatic subluxation. Skull base and vertebrae: No acute fracture or suspicious osseous lesion. Tiny bony defect in the right C1 transverse foramen has a chronic or developmental appearance. Soft tissues and spinal canal: No prevertebral fluid or swelling. No visible canal hematoma. Disc levels: Moderate disc degeneration at C5-6 and C6-7 with uncovertebral spurring resulting in neural foraminal stenosis which is mild-to-moderate on the left at C6-7. Moderate-sized central disc extrusion at C4-5 resulting in mild-to-moderate spinal stenosis. Upper chest: Clear lung apices. Partially visualized patulous esophagus containing fluid which may reflect dysmotility or reflux. Other: 1.7 cm peripherally calcified left thyroid nodule. IMPRESSION: 1. No evidence of acute intracranial abnormality. 2. No acute cervical spine fracture. 3. 1.7 cm incidental left thyroid nodule. Recommend thyroid US. Reference: J Am Coll Radiol. 2015 Feb;12(2): 143-50 Electronically Signed   By: Sebastian AcheAllen  Grady M.D.   On: 01/29/2022 11:11   MR BRAIN WO CONTRAST  Result Date: 02/16/2022 CLINICAL DATA:  Mental status change, unknown cause EXAM: MRI HEAD WITHOUT CONTRAST TECHNIQUE: Multiplanar, multiecho pulse sequences of the brain and surrounding structures were obtained without intravenous contrast. COMPARISON:  09/26/2017 MRI brain, correlation is also made with 01/29/2022 CT head FINDINGS: Brain: No restricted diffusion to suggest acute or subacute infarct. No acute hemorrhage, mass, mass effect, or midline shift. No extra-axial collection. Unchanged size and configuration of the ventricles, which are enlarged, similar to prior exams and favored to represent central cerebral atrophy. Vascular: Normal flow voids. Skull and upper cervical spine: Normal marrow signal. Sinuses/Orbits: Negative. Other:  Fluid in the left-greater-than-right mastoid air cells. IMPRESSION: 1. No acute intracranial process. 2. Redemonstrated ventricular prominence, which is favored to represent the sequela of central atrophy but could also be seen in the setting of normal pressure hydrocephalus, which is a clinical diagnosis. Correlate with symptoms and consider CSF tap test if clinically indicated. Electronically Signed   By: Wiliam KeAlison  Vasan M.D.   On: 02/16/2022 23:27   US Venous Img Lower Unilateral Left (DVT)  Result Date: 02/16/2022 CLINICAL DATA:  Left leg swelling EXAM: LEFT LOWER EXTREMITY VENOUS DOPPLER ULTRASOUND TECHNIQUE: Gray-scale sonography with compression, as well as color and duplex ultrasound, were performed to evaluate the deep venous system(s) from the level of the common femoral vein through the popliteal and proximal calf veins. COMPARISON:  None. FINDINGS: VENOUS Normal compressibility of the common femoral, superficial femoral, and popliteal veins, as well as the visualized calf veins. Visualized portions of profunda femoral vein and great saphenous vein unremarkable. No filling defects to suggest DVT on grayscale or color Doppler imaging. Doppler waveforms show normal direction of venous flow, normal respiratory plasticity and response to augmentation. Limited views of the contralateral common femoral vein are unremarkable. OTHER None. Limitations: none IMPRESSION: Negative. Electronically Signed   By: Charlett NoseKevin  Dover M.D.   On:  02/16/2022 23:19   DG Chest Portable 1 View  Result Date: 02/16/2022 CLINICAL DATA:  Weakness EXAM: PORTABLE CHEST 1 VIEW COMPARISON:  01/29/2022 FINDINGS: Artifact overlies the chest. Heart size is normal. Tortuosity of the aorta. Lungs clear except for mild linear scarring at the left base. No sign of active infiltrate, mass, effusion or collapse. IMPRESSION: Mild basilar scarring on the left. Tortuous aorta. No active disease identified. Electronically Signed   By: Paulina Fusi  M.D.   On: 02/16/2022 16:19   DG Chest Portable 1 View  Result Date: 01/29/2022 CLINICAL DATA:  tachypnea, tachycardia EXAM: PORTABLE CHEST 1 VIEW COMPARISON:  January 24, 2021. FINDINGS: No consolidation. No visible pleural effusions or pneumothorax. Unchanged cardiomediastinal silhouette. IMPRESSION: No evidence of acute cardiopulmonary disease. Electronically Signed   By: Feliberto Harts M.D.   On: 01/29/2022 11:38   US THYROID  Result Date: 01/30/2022 CLINICAL DATA:  Thyroid nodule on CT. EXAM: THYROID ULTRASOUND TECHNIQUE: Ultrasound examination of the thyroid gland and adjacent soft tissues was performed. COMPARISON:  Cervical spine CT 01/29/2022 FINDINGS: Parenchymal Echotexture: Mildly heterogenous Isthmus: 0.4 cm Right lobe: 5.1 x 1.9 x 1.5 cm Left lobe: 4.6 x 1.9 x 1.8 cm _________________________________________________________ Estimated total number of nodules >/= 1 cm: 1 Number of spongiform nodules >/=  2 cm not described below (TR1): 0 Number of mixed cystic and solid nodules >/= 1.5 cm not described below (TR2): 0 _________________________________________________________ Nodule #1 in the right mid thyroid lobe is a hypoechoic nodule that measures 0.7 x 0.4 x 0.6 cm. This does not meet criteria for biopsy or dedicated follow-up. Nodule #2 in the right mid thyroid lobe is a predominantly isoechoic nodule that measures 0.9 x 0.5 x 0.8 cm. This nodule does not meet criteria for biopsy or dedicated follow-up. Nodule # 3: Location: Left; Inferior Maximum size: 1.6 cm; Other 2 dimensions: 1.4 x 1.4 cm Composition: cannot determine (2) Echogenicity: cannot determine (1) Shape: not taller-than-wide (0) Margins: ill-defined (0) Echogenic foci: peripheral calcifications (2) ACR TI-RADS total points: 5. ACR TI-RADS risk category: TR4 (4-6 points). ACR TI-RADS recommendations: **Given size (>/= 1.5 cm) and appearance, fine needle aspiration of this moderately suspicious nodule should be considered based  on TI-RADS criteria. _________________________________________________________ IMPRESSION: 1. Bilateral thyroid nodules. 2. Dominant nodule (Nodule #3) is located in the left inferior thyroid lobe. This nodule has peripheral calcifications and measures up to 1.6 cm. This is a TR 4 nodule and meets criteria for ultrasound-guided biopsy. The above is in keeping with the ACR TI-RADS recommendations - J Am Coll Radiol 2017;14:587-595. Electronically Signed   By: Richarda Overlie M.D.   On: 01/30/2022 08:03    Labs: Basic Metabolic Panel: Recent Labs  Lab 02/16/22 1223 02/17/22 0353 02/18/22 0753 02/19/22 0440 02/20/22 0538  NA 127* 131* 133* 133* 133*  K 3.7 4.0 4.2 4.2 4.1  CL 89* 94* 96* 98 95*  CO2 27 29 27 26 27   GLUCOSE 181* 134* 128* 115* 110*  BUN 19 12 8 17 14   CREATININE 1.08* 0.78 0.76 1.07* 0.74  CALCIUM 8.7* 8.8* 9.0 8.9 9.2  MG 2.4  --   --   --   --   PHOS 2.5  --   --   --   --    CBC: Recent Labs  Lab 02/16/22 1223 02/17/22 0623 02/18/22 0753  WBC 8.2 9.1 9.3  NEUTROABS 6.1  --   --   HGB 12.8 13.0 12.9  HCT 38.4 38.9 38.8  MCV  93.4 93.3 93.0  PLT 279 296 288   Microbiology: COVID neg  Time coordinating discharge: Over 30 minutes  Leeroy Bockhelsey L Trueman Worlds, MD  [facility] 02/20/2022, 12:26 PM

## 2022-02-21 DIAGNOSIS — E669 Obesity, unspecified: Secondary | ICD-10-CM | POA: Diagnosis not present

## 2022-02-21 DIAGNOSIS — R410 Disorientation, unspecified: Secondary | ICD-10-CM | POA: Diagnosis not present

## 2022-02-21 DIAGNOSIS — F419 Anxiety disorder, unspecified: Secondary | ICD-10-CM | POA: Diagnosis not present

## 2022-02-21 DIAGNOSIS — E871 Hypo-osmolality and hyponatremia: Secondary | ICD-10-CM | POA: Diagnosis not present

## 2022-02-21 NOTE — Progress Notes (Signed)
Physical Therapy Treatment ?Patient Details ?Name: Jacqueline Williams ?MRN: [id number] ?DOB: 11-16-1951 ?Today's Date: 02/21/2022 ? ? ?History of Present Illness Ms. Tajae Maiolo is a 71 year old female with medical history of obesity, hypertension, chronic pain syndrome, GERD, neuropathy, generalized anxiety, acquired scoliosis, history of neurogenic claudication due to lumbar spinal stenosis, depression, insomnia, who presents to the emergency department from [facility] for chief concerns of altered mental status and slipped out of bed landing on her buttock, on levaquin for UTI ? ?  ?PT Comments  ? ? Patient alert, in bed, agreeable to PT, oriented x4. Pt still complains of fear of falling with all mobility, encouragement and education provided as able. Supine to sit with HOB elevated and bed rails, supervision. Sit <> stand with RW and CGA from EOB and from recliner, reminder of hand placement each time. She was able to step pivot to recliner with CGA, and then was also able to ambulate ~22ft with RW and CGA. Pt deferred further mobility attempts due to anxiety and fear. Pt up in chair with all needs in reach. The patient would benefit from further skilled PT intervention to continue to progress towards goals. Recommendation remains appropriate.  ? ? ?   ?Recommendations for follow up therapy are one component of a multi-disciplinary discharge planning process, led by the attending physician.  Recommendations may be updated based on patient status, additional functional criteria and insurance authorization. ? ?Follow Up Recommendations ? Skilled nursing-short term rehab (<3 hours/day) ?  ?  ?Assistance Recommended at Discharge Frequent or constant Supervision/Assistance  ?Patient can return home with the following A little help with walking and/or transfers;A little help with bathing/dressing/bathroom;Assistance with cooking/housework;Assist for transportation;Help with stairs or ramp for entrance ?  ?Equipment  Recommendations ? Rolling walker (2 wheels)  ?  ?Recommendations for Other Services   ? ? ?  ?Precautions / Restrictions Precautions ?Precautions: Fall ?Restrictions ?Weight Bearing Restrictions: No  ?  ? ?Mobility ? Bed Mobility ?Overal bed mobility: Needs Assistance ?Bed Mobility: Sit to Supine ?  ?  ?Supine to sit: Supervision, HOB elevated ?  ?  ?General bed mobility comments: reliant on bed rails, fearful ?  ? ?Transfers ?Overall transfer level: Needs assistance ?Equipment used: Rolling walker (2 wheels) ?Transfers: Sit to/from Stand ?Sit to Stand: Min guard ?Stand pivot transfers: Min guard ?  ?  ?  ?  ?  ?  ? ?Ambulation/Gait ?  ?Gait Distance (Feet): 5 Feet ?Assistive device: Rolling walker (2 wheels) ?Gait Pattern/deviations: Step-through pattern, Decreased step length - right, Decreased step length - left, Narrow base of support, Trunk flexed ?Gait velocity: decreased ?  ?  ?General Gait Details: no LOB, very fearful, education on upright posture throughout ? ? ?Stairs ?  ?  ?  ?  ?  ? ? ?Wheelchair Mobility ?  ? ?Modified Rankin (Stroke Patients Only) ?  ? ? ?  ?Balance Overall balance assessment: Needs assistance, History of Falls ?Sitting-balance support: Feet supported ?Sitting balance-Leahy Scale: Fair ?Sitting balance - Comments: able to sit edge of bed with close supervision ?Postural control: Posterior lean ?Standing balance support: Single extremity supported, During functional activity ?Standing balance-Leahy Scale: Fair ?  ?  ?  ?  ?  ?  ?  ?  ?  ?  ?  ?  ?  ? ?  ?Cognition Arousal/Alertness: Awake/alert ?Behavior During Therapy: [facility] for tasks assessed/performed, Flat affect ?Overall Cognitive Status: Within Functional Limits for tasks assessed ?  ?  ?  ?  ?  ?  ?  ?  ?  ?  ?  ?  ?  ?  ?  ?  ?  ?  ?  ? ?  ?  Exercises   ? ?  ?General Comments   ?  ?  ? ?Pertinent Vitals/Pain Pain Assessment ?Pain Assessment: No/denies pain  ? ? ?Home Living   ?  ?  ?  ?  ?  ?  ?  ?  ?  ?   ?  ?Prior Function    ?   ?  ?   ? ?PT Goals (current goals can now be found in the care plan section) Progress towards PT goals: Progressing toward goals ? ?  ?Frequency ? ? ? Min 2X/week ? ? ? ?  ?PT Plan Current plan remains appropriate  ? ? ?Co-evaluation   ?  ?  ?  ?  ? ?  ?AM-PAC PT "6 Clicks" Mobility   ?Outcome Measure ? Help needed turning from your back to your side while in a flat bed without using bedrails?: A Little ?Help needed moving from lying on your back to sitting on the side of a flat bed without using bedrails?: A Little ?Help needed moving to and from a bed to a chair (including a wheelchair)?: A Little ?Help needed standing up from a chair using your arms (e.g., wheelchair or bedside chair)?: A Little ?Help needed to walk in hospital room?: A Little ?Help needed climbing 3-5 steps with a railing? : A Little ?6 Click Score: 18 ? ?  ?End of Session Equipment Utilized During Treatment: Gait belt ?Activity Tolerance: Patient tolerated treatment well ?Patient left: with call bell/phone within reach;in chair;with chair alarm set;with nursing/sitter in room ?Nurse Communication: Mobility status ?PT Visit Diagnosis: Unsteadiness on feet (R26.81);Other abnormalities of gait and mobility (R26.89);Repeated falls (R29.6);Difficulty in walking, not elsewhere classified (R26.2);Muscle weakness (generalized) (M62.81) ?  ? ? ?Time: 5400-8676 ?PT Time Calculation (min) (ACUTE ONLY): 14 min ? ?Charges:  $Therapeutic Activity: 8-22 mins          ?          ? ?Olga Coaster PT, DPT ?12:29 PM,02/21/22 ? ? ?

## 2022-02-21 NOTE — TOC Progression Note (Signed)
Transition of Care (TOC) - Progression Note  ? ? ?Patient Details  ?Name: Jacqueline Williams ?MRN: [id number] ?Date of Birth: [birth year] ? ?Transition of Care (TOC) CM/SW Contact  ?[place] Cellar, RN ?Phone Number: ?02/21/2022, 3:13 PM ? ?Clinical Narrative:    ?Received fax from [facility] requesting additional information for appeal. RN CM sent all requested information.  ? ? ?Expected Discharge Plan: Skilled Nursing Facility ?Barriers to Discharge: Continued Medical Work up ? ?Expected Discharge Plan and Services ?Expected Discharge Plan: Skilled Nursing Facility ?In-house Referral: Clinical Social Work ?  ?  ?Living arrangements for the past 2 months: Assisted Living Facility ?Expected Discharge Date: 02/20/22               ?  ?  ?  ?  ?  ?  ?  ?  ?  ?  ? ? ?Social Determinants of Health (SDOH) Interventions ?  ? ?Readmission Risk Interventions ?No flowsheet data found. ? ?

## 2022-02-21 NOTE — Discharge Summary (Addendum)
Physician Discharge Summary  Patient: Jacqueline Williams [id number] DOB: April 10, 1951   Code Status: Full Code Admit date: 02/16/2022 Discharge date: 02/26/2022 Disposition: Skilled nursing facility, PT and OT PCP: Virgie Dad, FNP  Recommendations for Outpatient Follow-up:  Follow up with PCP within 1-2 weeks Patient is on several scheduled and PRN sedative medications that likely contributed to her altered mental status.  Please see medication reconciliation below for changes to those home medications in effort to decrease her sedation and fall risk.  Also discontinued oxybutynin. Consider myrbetriq, pelvic PT if still symptomatic.  Please monitor metabolic panel for sodium level which was stable at 132 day prior to dc Please review need for monthly depo- estradiol injections Recommend outpatient thyroid nodule biopsy. Was not able to schedule while patient was inpatient  Discharge Diagnoses:  Principal Problem:   Altered mental status Active Problems:   Hyponatremia   Anemia   Class 2 obesity   Hypertension   Anxiety and depression   Generalized weakness   Confusion   Leg swelling   Thyroid nodule greater than or equal to 1 cm in diameter incidentally noted on imaging study  Brief Hospital Course Summary: Jacqueline Williams is a 71 y.o. female with a PMH significant for HTN, chronic pain syndrome, GERD, neuropathy, GAD, acquired scoliosis, obesity, history of neurogenic claudication due to lumbar spinal stenosis, severe depression, insomnia. They presented from [facility] to the ED on 02/16/2022 with AMS x 1 days. In the ED, it was found that they were alert and oriented x3 but fluctuating confusion and memory loss. Her mental status appeared to be close to baseline according to the patient and report at time of discharge. Overall, she remained stable. Head imaging was negative for acute process including MRI. The most likely explanation of her presenting symptoms were medication  interactions/effects in conjunction with dementia and chronic psychiatric conditions. Several modifications were made to her home sedating and cognitive altering medications at time of discharge as can be seen in reconciliation below.   Additionally, she was found to be hyponatremic with sodium of 127 on admission thought to be related to recent antibiotic use in combination of furosemide which was held during admission. They were treated with 1 L bolus of NS. Her Na+ responded well and was stable for several days a 133. Was 132 on day prior to discharge.   Additionally, patient had incidental finding of thyroid nodule >1cm. Biopsies are not scheduled inpatient so outpatient referral was placed.    Discharge Condition: Good, improved Recommended discharge diet: Regular healthy diet  Consultations: None   Procedures/Studies: Brain MRI   Allergies as of 02/26/2022       Reactions   Other Shortness Of Breath   caffergot   Penicillin G Sodium Anaphylaxis   Aspirin Other (See Comments)   S/p Gastric bypass   Ergotamine Tartrate Other (See Comments)   Ibuprofen Other (See Comments)   S/p gastric bypass   Ketorolac Tromethamine Nausea Only   Naproxen Nausea Only   Nsaids    Other reaction(s): Other (See Comments) GI bleed   Olanzapine Other (See Comments)   Oxaprozin Other (See Comments)   Penicillins    Seroquel [quetiapine Fumarate] Other (See Comments)   hallucinations        Medication List     STOP taking these medications    1st Medx-Patch/ Lidocaine 4-0.374-05-11 % Ptch Generic drug: Lido-Capsaicin-Men-Methyl Sal   cetirizine 10 MG tablet Commonly known as: ZYRTEC  furosemide 20 MG tablet Commonly known as: LASIX   Macrobid 100 MG capsule Generic drug: nitrofurantoin (macrocrystal-monohydrate)   magnesium oxide 400 MG tablet Commonly known as: MAG-OX   oxybutynin 5 MG tablet Commonly known as: DITROPAN   oxyCODONE-acetaminophen 5-325 MG tablet Commonly  known as: PERCOCET/ROXICET   pantoprazole 20 MG tablet Commonly known as: PROTONIX   pantoprazole 40 MG tablet Commonly known as: PROTONIX   potassium chloride 10 MEQ CR capsule Commonly known as: MICRO-K   promethazine 25 MG tablet Commonly known as: PHENERGAN   sucralfate 1 g tablet Commonly known as: CARAFATE       TAKE these medications    acetaminophen 325 MG tablet Commonly known as: TYLENOL Take 650 mg by mouth every 6 (six) hours as needed.   Creon 24000-76000 units Cpep Generic drug: Pancrelipase (Lip-Prot-Amyl) Take 48,000 Units by mouth 3 (three) times daily before meals.   cyanocobalamin 1000 MCG/ML injection Commonly known as: (VITAMIN B-12) Inject 1 mL into the skin every 14 (fourteen) days.   Depo-Estradiol 5 MG/ML injection Generic drug: estradiol cypionate Inject 0.4 mg into the muscle every 28 (twenty-eight) days.   divalproex 250 MG DR tablet Commonly known as: DEPAKOTE Take 250 mg by mouth 2 (two) times daily.   ferrous gluconate 324 MG tablet Commonly known as: FERGON Take 1 tablet (324 mg total) by mouth daily with breakfast. What changed: when to take this   fluticasone 50 MCG/ACT nasal spray Commonly known as: FLONASE Place 1 spray into both nostrils daily as needed for allergies or rhinitis.   folic acid 1 MG tablet Commonly known as: FOLVITE Take 1 mg by mouth daily.   gabapentin 100 MG capsule Commonly known as: NEURONTIN Take 1 capsule (100 mg total) by mouth at bedtime as needed. What changed:  when to take this reasons to take this   lactase 3000 units tablet Commonly known as: LACTAID Take 3,000 Units by mouth 3 (three) times daily with meals.   levothyroxine 25 MCG tablet Commonly known as: SYNTHROID Take 25 mcg by mouth daily.   lisinopril 10 MG tablet Commonly known as: ZESTRIL Take 10 mg by mouth daily.   loperamide 2 MG capsule Commonly known as: IMODIUM Take 1 capsule (2 mg total) by mouth every 2 (two)  hours as needed for diarrhea or loose stools. What changed: when to take this   LORazepam 1 MG tablet Commonly known as: ATIVAN Take 0.5 tablets (0.5 mg total) by mouth daily as needed for anxiety. What changed: when to take this   ondansetron 4 MG tablet Commonly known as: ZOFRAN Take 4 mg by mouth every 8 (eight) hours as needed.   polyethylene glycol 17 g packet Commonly known as: MIRALAX / GLYCOLAX Take 17 g by mouth daily as needed.   RA Melatonin 10 MG Tabs Generic drug: Melatonin Take 10 tablets by mouth at bedtime.   risperiDONE 2 MG tablet Commonly known as: RISPERDAL Take 2 mg by mouth 2 (two) times daily.   simethicone 80 MG chewable tablet Commonly known as: MYLICON Chew 80 mg by mouth every 6 (six) hours as needed for flatulence.   traZODone 50 MG tablet Commonly known as: DESYREL Take 0.5 tablets (25 mg total) by mouth at bedtime as needed for sleep. What changed:  medication strength how much to take when to take this reasons to take this   venlafaxine XR 150 MG 24 hr capsule Commonly known as: EFFEXOR-XR Take 150 mg by mouth at bedtime.  Contact information for after-discharge care     Destination     [facility] .   Service: Skilled Nursing Contact information: [address] [place] [phone]                     Subjective   Pt reports no complaints today. She smiled when I told her that she was approved to go to rehab. She feels overall well.  Objective  Blood pressure 130/60, pulse 83, temperature 98 F (36.7 C), temperature source Oral, resp. rate 18, height 5\' 8"  (1.727 m), weight 105.2 kg, SpO2 97 %.   General: Pt is alert, awake, not in acute distress Cardiovascular: RRR, S1/S2 +, no rubs, no gallops Respiratory: CTA bilaterally, no wheezing, no rhonchi Abdominal: Soft, NT, ND, bowel sounds + Extremities: no edema, no cyanosis Psych: mood is  normal. Flat affect. Possesses capacity.    The results of significant diagnostics from this hospitalization (including imaging, microbiology, ancillary and laboratory) are listed below for reference.   Imaging studies: DG Lumbar Spine Complete  Result Date: 01/29/2022 CLINICAL DATA:  Fall yesterday. EXAM: LUMBAR SPINE - COMPLETE 4+ VIEW COMPARISON:  01/24/2021 FINDINGS: Mild anterolisthesis L2-3 and L3-4 unchanged. Pedicle screw and interbody fusion L5-S1 unchanged. Anterolisthesis L5-S1 unchanged Negative for fracture.  No skeletal lesion. IMPRESSION: Negative for lumbar fracture. Electronically Signed   By: Franchot Gallo M.D.   On: 01/29/2022 11:38   CT HEAD WO CONTRAST (5MM)  Result Date: 01/29/2022 CLINICAL DATA:  Neck trauma (Age >= 65y); Mental status change, unknown cause. Unable to stand. Fall yesterday. Slurred speech. EXAM: CT HEAD WITHOUT CONTRAST CT CERVICAL SPINE WITHOUT CONTRAST TECHNIQUE: Multidetector CT imaging of the head and cervical spine was performed following the standard protocol without intravenous contrast. Multiplanar CT image reconstructions of the cervical spine were also generated. RADIATION DOSE REDUCTION: This exam was performed according to the departmental dose-optimization program which includes automated exposure control, adjustment of the mA and/or kV according to patient size and/or use of iterative reconstruction technique. COMPARISON:  Head CT 01/24/2021.  Head MRI 09/26/2017. FINDINGS: CT HEAD FINDINGS Brain: There is no evidence of an acute infarct, intracranial hemorrhage, mass, midline shift, or extra-axial fluid collection. Mild chronic enlargement of the lateral ventricles is unchanged from the prior CT and favored to reflect central predominant cerebral atrophy over hydrocephalus. Vascular: No hyperdense vessel. Skull: No acute fracture or suspicious osseous lesion. Sinuses/Orbits: Visualized paranasal sinuses are clear. Trace left mastoid effusion.  Unremarkable orbits. Other: None. CT CERVICAL SPINE FINDINGS Alignment: Straightening of the normal cervical lordosis. Mild right convex curvature of the cervical spine. No evidence of traumatic subluxation. Skull base and vertebrae: No acute fracture or suspicious osseous lesion. Tiny bony defect in the right C1 transverse foramen has a chronic or developmental appearance. Soft tissues and spinal canal: No prevertebral fluid or swelling. No visible canal hematoma. Disc levels: Moderate disc degeneration at C5-6 and C6-7 with uncovertebral spurring resulting in neural foraminal stenosis which is mild-to-moderate on the left at C6-7. Moderate-sized central disc extrusion at C4-5 resulting in mild-to-moderate spinal stenosis. Upper chest: Clear lung apices. Partially visualized patulous esophagus containing fluid which may reflect dysmotility or reflux. Other: 1.7 cm peripherally calcified left thyroid nodule. IMPRESSION: 1. No evidence of acute intracranial abnormality. 2. No acute cervical spine fracture. 3. 1.7 cm incidental left thyroid nodule. Recommend thyroid US. Reference: J Am Coll Radiol. 2015 Feb;12(2): 143-50 Electronically Signed   By: Zenia Resides  Jeralyn Ruths M.D.   On: 01/29/2022 11:11   CT Cervical Spine Wo Contrast  Result Date: 01/29/2022 CLINICAL DATA:  Neck trauma (Age >= 65y); Mental status change, unknown cause. Unable to stand. Fall yesterday. Slurred speech. EXAM: CT HEAD WITHOUT CONTRAST CT CERVICAL SPINE WITHOUT CONTRAST TECHNIQUE: Multidetector CT imaging of the head and cervical spine was performed following the standard protocol without intravenous contrast. Multiplanar CT image reconstructions of the cervical spine were also generated. RADIATION DOSE REDUCTION: This exam was performed according to the departmental dose-optimization program which includes automated exposure control, adjustment of the mA and/or kV according to patient size and/or use of iterative reconstruction technique.  COMPARISON:  Head CT 01/24/2021.  Head MRI 09/26/2017. FINDINGS: CT HEAD FINDINGS Brain: There is no evidence of an acute infarct, intracranial hemorrhage, mass, midline shift, or extra-axial fluid collection. Mild chronic enlargement of the lateral ventricles is unchanged from the prior CT and favored to reflect central predominant cerebral atrophy over hydrocephalus. Vascular: No hyperdense vessel. Skull: No acute fracture or suspicious osseous lesion. Sinuses/Orbits: Visualized paranasal sinuses are clear. Trace left mastoid effusion. Unremarkable orbits. Other: None. CT CERVICAL SPINE FINDINGS Alignment: Straightening of the normal cervical lordosis. Mild right convex curvature of the cervical spine. No evidence of traumatic subluxation. Skull base and vertebrae: No acute fracture or suspicious osseous lesion. Tiny bony defect in the right C1 transverse foramen has a chronic or developmental appearance. Soft tissues and spinal canal: No prevertebral fluid or swelling. No visible canal hematoma. Disc levels: Moderate disc degeneration at C5-6 and C6-7 with uncovertebral spurring resulting in neural foraminal stenosis which is mild-to-moderate on the left at C6-7. Moderate-sized central disc extrusion at C4-5 resulting in mild-to-moderate spinal stenosis. Upper chest: Clear lung apices. Partially visualized patulous esophagus containing fluid which may reflect dysmotility or reflux. Other: 1.7 cm peripherally calcified left thyroid nodule. IMPRESSION: 1. No evidence of acute intracranial abnormality. 2. No acute cervical spine fracture. 3. 1.7 cm incidental left thyroid nodule. Recommend thyroid US. Reference: J Am Coll Radiol. 2015 Feb;12(2): 143-50 Electronically Signed   By: Logan Bores M.D.   On: 01/29/2022 11:11   MR BRAIN WO CONTRAST  Result Date: 02/16/2022 CLINICAL DATA:  Mental status change, unknown cause EXAM: MRI HEAD WITHOUT CONTRAST TECHNIQUE: Multiplanar, multiecho pulse sequences of the brain  and surrounding structures were obtained without intravenous contrast. COMPARISON:  09/26/2017 MRI brain, correlation is also made with 01/29/2022 CT head FINDINGS: Brain: No restricted diffusion to suggest acute or subacute infarct. No acute hemorrhage, mass, mass effect, or midline shift. No extra-axial collection. Unchanged size and configuration of the ventricles, which are enlarged, similar to prior exams and favored to represent central cerebral atrophy. Vascular: Normal flow voids. Skull and upper cervical spine: Normal marrow signal. Sinuses/Orbits: Negative. Other: Fluid in the left-greater-than-right mastoid air cells. IMPRESSION: 1. No acute intracranial process. 2. Redemonstrated ventricular prominence, which is favored to represent the sequela of central atrophy but could also be seen in the setting of normal pressure hydrocephalus, which is a clinical diagnosis. Correlate with symptoms and consider CSF tap test if clinically indicated. Electronically Signed   By: Merilyn Baba M.D.   On: 02/16/2022 23:27   US Venous Img Lower Unilateral Left (DVT)  Result Date: 02/16/2022 CLINICAL DATA:  Left leg swelling EXAM: LEFT LOWER EXTREMITY VENOUS DOPPLER ULTRASOUND TECHNIQUE: Gray-scale sonography with compression, as well as color and duplex ultrasound, were performed to evaluate the deep venous system(s) from the level of the common femoral vein through  the popliteal and proximal calf veins. COMPARISON:  None. FINDINGS: VENOUS Normal compressibility of the common femoral, superficial femoral, and popliteal veins, as well as the visualized calf veins. Visualized portions of profunda femoral vein and great saphenous vein unremarkable. No filling defects to suggest DVT on grayscale or color Doppler imaging. Doppler waveforms show normal direction of venous flow, normal respiratory plasticity and response to augmentation. Limited views of the contralateral common femoral vein are unremarkable. OTHER None.  Limitations: none IMPRESSION: Negative. Electronically Signed   By: Rolm Baptise M.D.   On: 02/16/2022 23:19   DG Chest Portable 1 View  Result Date: 02/16/2022 CLINICAL DATA:  Weakness EXAM: PORTABLE CHEST 1 VIEW COMPARISON:  01/29/2022 FINDINGS: Artifact overlies the chest. Heart size is normal. Tortuosity of the aorta. Lungs clear except for mild linear scarring at the left base. No sign of active infiltrate, mass, effusion or collapse. IMPRESSION: Mild basilar scarring on the left. Tortuous aorta. No active disease identified. Electronically Signed   By: Nelson Chimes M.D.   On: 02/16/2022 16:19   DG Chest Portable 1 View  Result Date: 01/29/2022 CLINICAL DATA:  tachypnea, tachycardia EXAM: PORTABLE CHEST 1 VIEW COMPARISON:  January 24, 2021. FINDINGS: No consolidation. No visible pleural effusions or pneumothorax. Unchanged cardiomediastinal silhouette. IMPRESSION: No evidence of acute cardiopulmonary disease. Electronically Signed   By: Margaretha Sheffield M.D.   On: 01/29/2022 11:38   US THYROID  Result Date: 01/30/2022 CLINICAL DATA:  Thyroid nodule on CT. EXAM: THYROID ULTRASOUND TECHNIQUE: Ultrasound examination of the thyroid gland and adjacent soft tissues was performed. COMPARISON:  Cervical spine CT 01/29/2022 FINDINGS: Parenchymal Echotexture: Mildly heterogenous Isthmus: 0.4 cm Right lobe: 5.1 x 1.9 x 1.5 cm Left lobe: 4.6 x 1.9 x 1.8 cm _________________________________________________________ Estimated total number of nodules >/= 1 cm: 1 Number of spongiform nodules >/=  2 cm not described below (TR1): 0 Number of mixed cystic and solid nodules >/= 1.5 cm not described below (TR2): 0 _________________________________________________________ Nodule #1 in the right mid thyroid lobe is a hypoechoic nodule that measures 0.7 x 0.4 x 0.6 cm. This does not meet criteria for biopsy or dedicated follow-up. Nodule #2 in the right mid thyroid lobe is a predominantly isoechoic nodule that measures 0.9 x  0.5 x 0.8 cm. This nodule does not meet criteria for biopsy or dedicated follow-up. Nodule # 3: Location: Left; Inferior Maximum size: 1.6 cm; Other 2 dimensions: 1.4 x 1.4 cm Composition: cannot determine (2) Echogenicity: cannot determine (1) Shape: not taller-than-wide (0) Margins: ill-defined (0) Echogenic foci: peripheral calcifications (2) ACR TI-RADS total points: 5. ACR TI-RADS risk category: TR4 (4-6 points). ACR TI-RADS recommendations: **Given size (>/= 1.5 cm) and appearance, fine needle aspiration of this moderately suspicious nodule should be considered based on TI-RADS criteria. _________________________________________________________ IMPRESSION: 1. Bilateral thyroid nodules. 2. Dominant nodule (Nodule #3) is located in the left inferior thyroid lobe. This nodule has peripheral calcifications and measures up to 1.6 cm. This is a TR 4 nodule and meets criteria for ultrasound-guided biopsy. The above is in keeping with the ACR TI-RADS recommendations - J Am Coll Radiol 2017;14:587-595. Electronically Signed   By: Markus Daft M.D.   On: 01/30/2022 08:03    Labs: Basic Metabolic Panel: Recent Labs  Lab 02/20/22 0538 02/22/22 0427 02/25/22 0821  NA 133* 133* 132*  K 4.1 4.1 3.9  CL 95* 97* 95*  CO2 27 28 29   GLUCOSE 110* 110* 105*  BUN 14 14 11   CREATININE 0.74 0.72  0.76  CALCIUM 9.2 9.1 9.1   CBC: Recent Labs  Lab 02/25/22 0821  WBC 7.1  HGB 13.0  HCT 37.3  MCV 91.9  PLT 299   Microbiology: COVID neg  Time coordinating discharge: Over 30 minutes  Richarda Osmond, MD  [facility] 02/26/2022, 10:18 AM

## 2022-02-21 NOTE — Plan of Care (Signed)

## 2022-02-22 DIAGNOSIS — M7989 Other specified soft tissue disorders: Secondary | ICD-10-CM

## 2022-02-22 DIAGNOSIS — F419 Anxiety disorder, unspecified: Secondary | ICD-10-CM | POA: Diagnosis not present

## 2022-02-22 DIAGNOSIS — E669 Obesity, unspecified: Secondary | ICD-10-CM | POA: Diagnosis not present

## 2022-02-22 DIAGNOSIS — R531 Weakness: Secondary | ICD-10-CM | POA: Diagnosis not present

## 2022-02-22 DIAGNOSIS — E871 Hypo-osmolality and hyponatremia: Secondary | ICD-10-CM | POA: Diagnosis not present

## 2022-02-22 LAB — BASIC METABOLIC PANEL
Anion gap: 8 (ref 5–15)
BUN: 14 mg/dL (ref 8–23)
CO2: 28 mmol/L (ref 22–32)
Calcium: 9.1 mg/dL (ref 8.9–10.3)
Chloride: 97 mmol/L — ABNORMAL LOW (ref 98–111)
Creatinine, Ser: 0.72 mg/dL (ref 0.44–1.00)
GFR, Estimated: >60 mL/min (ref >60)
Glucose, Bld: 110 mg/dL — ABNORMAL HIGH (ref 70–99)
Potassium: 4.1 mmol/L (ref 3.5–5.1)
Sodium: 133 mmol/L — ABNORMAL LOW (ref 135–145)

## 2022-02-22 MED ORDER — TRAZODONE HCL 50 MG PO TABS
25.0000 mg | ORAL_TABLET | Freq: Every day | ORAL | Status: DC
  Administered 2022-02-22 – 2022-02-25 (×4): 25 mg via ORAL
  Filled 2022-02-22 (×4): qty 1

## 2022-02-22 NOTE — Progress Notes (Signed)
OT Cancellation Note ? ?Patient Details ?Name: Jacqueline Williams ?MRN: [id number] ?DOB: 1951/05/21 ? ? ?Cancelled Treatment:    Reason Eval/Treat Not Completed: Patient declined, no reason specified. OT continues to follow pt for therapy services. Upon arrival to pt room, pt declines OT tx session this pm. States, "I've just been very sick today and don't think I can do it". Pt educated on importance of functional activity, continues to politely decline. Will re-attempt at a later date/time as available and pt medically appropriate for OT services.  ? ?Rockney Ghee, M.S., OTR/L ?Feeding Team - [facility] ?Ascom: [phone] ?02/22/22, 2:42 PM ? ? ?

## 2022-02-22 NOTE — TOC Progression Note (Addendum)
Transition of Care (TOC) - Progression Note  ? ? ?Patient Details  ?Name: Jacqueline Williams ?MRN: [id number] ?Date of Birth: 01-23-1951 ? ?Transition of Care (TOC) CM/SW Contact  ?Anselm Pancoast, RN ?Phone Number: ?02/22/2022, 2:37 PM ? ?Clinical Narrative:    ?Received call from Antoine @ [place] has still not been able to process appeal request. Reporting daughter is not listed as POA in their records. Lucita reports she will call back with directions for how to proceed. RN CM explained patient is medically ready for discharge.  ? ?LVMM for daughter updating that [employer] [facility] has yet to initiate the appeal and patient has been cleared for discharge.  ? ? ?Expected Discharge Plan: [place] ?Barriers to Discharge: Continued Medical Work up ? ?Expected Discharge Plan and Services ?Expected Discharge Plan: [place] ?In-house Referral: Clinical Social Work ?  ?  ?Living arrangements for the past 2 months: [place] ?Expected Discharge Date: 02/20/22               ?  ?  ?  ?  ?  ?  ?  ?  ?  ?  ? ? ?Social Determinants of Health (SDOH) Interventions ?  ? ?Readmission Risk Interventions ?No flowsheet data found. ? ?

## 2022-02-22 NOTE — Progress Notes (Addendum)
?PROGRESS NOTE ? ?Jacqueline Williams    DOB: [birth year], 71 y.o.  ?OAC:[id number]  ?  Code Status: Full Code   ?DOA: 02/16/2022   LOS: 0  ? ?Brief hospital course  ?Jacqueline Williams is a 71 y.o. female with a PMH significant for HTN, chronic pain syndrome, GERD, neuropathy, GAD, acquired scoliosis, obesity, history of neurogenic claudication due to lumbar spinal stenosis, depression, insomnia. ?They presented from [facility] to the ED on 02/16/2022 with AMS x 1 days. ?In the ED, it was found that they had alert and oriented x3 but fluctuating confusion and memory loss.  Additionally, she was found to be hyponatremic with sodium of 127 thought to be related to recent antibiotic use in combination of furosemide. ?Head imaging was negative for acute process including MRI. ?They were treated with 1 L bolus of NS.  ?Patient was admitted to medicine service for further workup and management of AMS as outlined in detail below. ? ?02/22/22 -stable, baseline. Medically ready for dc to SNF when bed available.  ? ?Assessment & Plan  ?Principal Problem: ?  Altered mental status ?Active Problems: ?  Hyponatremia ?  Anemia ?  Class 2 obesity ?  Hypertension ?  Anxiety and depression ?  Generalized weakness ?  Confusion ? ?Acute metabolic encephalopathy-resolved.  Patient states she is back at her baseline. Home sedative medications decreased/modified as listed below. ?- PT/OT recommending SNF ? ?Hyponatremia- Na+ stable at 133 for several days. ?- monitor periodically ? ?HTN-chronic, stable ?-Continue home lisinopril ? ?GAD  depression  insomnia- chronic, stable. Without behavioral disturbances ?- decreased trazodone, lorazepam ? ?H/o overactive bladder- asymptomatic ?- discontinued oxybutynin ?- monitor for symptoms ? ?H/o Anemia- hgb has been stable within normal range ? ?Body mass index is 35.28 kg/m?. ? ?VTE ppx: Place TED hose Start: 02/16/22 1832 ? ? ?Diet:  ?   ?Diet  ? Diet Heart Room service appropriate? Yes; Fluid  consistency: Thin  ? ?Subjective 02/22/22   ? ?Pt reports no concerns today. Awaiting disposition plan. ?  ?Objective  ? ?Vitals:  ? 02/21/22 2005 02/22/22 0109 02/22/22 3235 02/22/22 5732  ?BP: (!) 141/64 128/65 (!) 113/54 120/60  ?Pulse: 90 78 84 91  ?Resp: 19 19 16    ?Temp: 99 ?F (37.2 ?C) 98 ?F (36.7 ?C) (!) 97.4 ?F (36.3 ?C)   ?TempSrc:   Oral   ?SpO2: 97% 97% 94% 94%  ?Weight:      ?Height:      ? ? ?Intake/Output Summary (Last 24 hours) at 02/22/2022 1026 ?Last data filed at 02/22/2022 0500 ?Gross per 24 hour  ?Intake --  ?Output 2400 ml  ?Net -2400 ml  ? ?Filed Weights  ? 02/16/22 2025  ?Weight: 105.2 kg  ?  ? ?Physical Exam:  ?General: awake, alert, NAD ?HEENT: atraumatic, clear conjunctiva, anicteric sclera, MMM, hearing grossly normal ?Respiratory: normal respiratory effort. ?Cardiovascular: normal S1/S2, RRR, no JVD, murmurs, quick capillary refill  ?Nervous: A&O x3. no gross focal neurologic deficits, normal speech ?Extremities: moves all equally, no edema, normal tone ?Skin: dry, intact, normal temperature, normal color. No rashes, lesions or ulcers on exposed skin. Positive for posterior fissure of gluteal cleft  ?Psychiatry: flat mood, congruent affect ? ?Labs   ?I have personally reviewed the following labs and imaging studies ?CBC ?   ?Component Value Date/Time  ? WBC 9.3 02/18/2022 0753  ? RBC 4.17 02/18/2022 0753  ? HGB 12.9 02/18/2022 0753  ? HGB 11.3 (L) 04/28/2013 2126  ?  HCT 38.8 02/18/2022 0753  ? HCT 32.9 (L) 04/28/2013 2126  ? PLT 288 02/18/2022 0753  ? PLT 339 04/28/2013 2126  ? MCV 93.0 02/18/2022 0753  ? MCV 94 04/28/2013 2126  ? MCH 30.9 02/18/2022 0753  ? MCHC 33.2 02/18/2022 0753  ? RDW 12.1 02/18/2022 0753  ? RDW 14.6 (H) 04/28/2013 2126  ? LYMPHSABS 1.2 02/16/2022 1223  ? MONOABS 0.9 02/16/2022 1223  ? EOSABS 0.0 02/16/2022 1223  ? BASOSABS 0.0 02/16/2022 1223  ? ?BMP Latest Ref Rng & Units 02/22/2022 02/20/2022 02/19/2022  ?Glucose 70 - 99 mg/dL 299(B) 716(R) 678(L)  ?BUN 8 - 23 mg/dL 14  14 17   ?Creatinine 0.44 - 1.00 mg/dL 3.81 0.17)  ?Sodium 135 - 145 mmol/L 133(L) 133(L) 133(L)  ?Potassium 3.5 - 5.1 mmol/L 4.1 4.1 4.2  ?Chloride 98 - 111 mmol/L 97(L) 95(L) 98  ?CO2 22 - 32 mmol/L 28 27 26   ?Calcium 8.9 - 10.3 mg/dL 9.1 9.2 8.9  ? ? ?No results found. ? ?Disposition Plan & Communication  ?Patient status: Observation  ?Admitted From: Home ?Planned disposition location: Skilled nursing facility ?Anticipated discharge date: TBD pending safe dispo plan. Family appealing insurance denial of SNF ? ?Family Communication: daughter on phone ?  ?Author: ?5.10(C, DO ?[facility] ?02/22/2022, 10:26 AM  ? ?Available by Epic secure chat 7AM-7PM. ?If 7PM-7AM, please contact night-coverage.  ?[facility] contact information found on [employer] [employer]. ? ?

## 2022-02-22 NOTE — TOC Progression Note (Signed)
Transition of Care (TOC) - Progression Note  ? ? ?Patient Details  ?Name: Jacqueline Williams ?MRN: [id number] ?Date of Birth: Jul 15, 1951 ? ?Transition of Care (TOC) CM/SW Contact  ?Bellwood Cellar, RN ?Phone Number: ?02/22/2022, 3:32 PM ? ?Clinical Narrative:    ?Spoke with daughter at bedside and update on delay with appeal process.  ? ?Refaxed all requested information to [facility] appeals department. Confirmed appeals can take up to 72 hours.  ? ? ?Expected Discharge Plan: Skilled Nursing Facility ?Barriers to Discharge: Continued Medical Work up ? ?Expected Discharge Plan and Services ?Expected Discharge Plan: Skilled Nursing Facility ?In-house Referral: Clinical Social Work ?  ?  ?Living arrangements for the past 2 months: Assisted Living Facility ?Expected Discharge Date: 02/20/22               ?  ?  ?  ?  ?  ?  ?  ?  ?  ?  ? ? ?Social Determinants of Health (SDOH) Interventions ?  ? ?Readmission Risk Interventions ?No flowsheet data found. ? ?

## 2022-02-23 DIAGNOSIS — E041 Nontoxic single thyroid nodule: Secondary | ICD-10-CM

## 2022-02-23 DIAGNOSIS — R531 Weakness: Secondary | ICD-10-CM | POA: Diagnosis not present

## 2022-02-23 DIAGNOSIS — E871 Hypo-osmolality and hyponatremia: Secondary | ICD-10-CM | POA: Diagnosis not present

## 2022-02-23 DIAGNOSIS — E669 Obesity, unspecified: Secondary | ICD-10-CM | POA: Diagnosis not present

## 2022-02-23 DIAGNOSIS — F419 Anxiety disorder, unspecified: Secondary | ICD-10-CM | POA: Diagnosis not present

## 2022-02-23 NOTE — TOC Progression Note (Signed)
Transition of Care (TOC) - Progression Note  ? ? ?Patient Details  ?Name: Jacqueline Williams ?MRN: [id number] ?Date of Birth: 1951-03-23 ? ?Transition of Care (TOC) CM/SW Contact  ?Bing Quarry, RN ?Phone Number: ?02/23/2022, 9:37 AM ? ?Clinical Narrative:  3/4: Pending appeal process at [facility] per prior CM notes of 02/22/22. Have not heard anything yet today. Will monitor. Gabriel Cirri RN CM   ? ? ? ?Expected Discharge Plan: Skilled Nursing Facility ?Barriers to Discharge: Continued Medical Work up ? ?Expected Discharge Plan and Services ?Expected Discharge Plan: Skilled Nursing Facility ?In-house Referral: Clinical Social Work ?  ?  ?Living arrangements for the past 2 months: Assisted Living Facility ?Expected Discharge Date: 02/20/22               ?  ?  ?  ?  ?  ?  ?  ?  ?  ?  ? ? ?Social Determinants of Health (SDOH) Interventions ?  ? ?Readmission Risk Interventions ?No flowsheet data found. ? ?

## 2022-02-23 NOTE — Progress Notes (Signed)
?PROGRESS NOTE ? ?Jacqueline Williams    DOB: 03-Jan-1951, 71 y.o.  ?[id number]:[id number]  ?  Code Status: Full Code   ?DOA: 02/16/2022   LOS: 0  ? ?Brief hospital course  ?Jacqueline Williams is a 71 y.o. female with a PMH significant for HTN, chronic pain syndrome, GERD, neuropathy, GAD, acquired scoliosis, obesity, history of neurogenic claudication due to lumbar spinal stenosis, depression, insomnia. ?They presented from [facility] to the ED on 02/16/2022 with AMS x 1 days. ?In the ED, it was found that they had alert and oriented x3 but fluctuating confusion and memory loss.  Additionally, she was found to be hyponatremic with sodium of 127 thought to be related to recent antibiotic use in combination of furosemide. ?Head imaging was negative for acute process including MRI. ?They were treated with 1 L bolus of NS.  ?Patient was admitted to medicine service for further workup and management of AMS as outlined in detail below. ? ?02/22/22 -stable, baseline. Medically ready for dc to SNF when bed available.  ? ?Assessment & Plan  ?Principal Problem: ?  Altered mental status ?Active Problems: ?  Hyponatremia ?  Anemia ?  Class 2 obesity ?  Hypertension ?  Anxiety and depression ?  Generalized weakness ?  Confusion ?  Leg swelling ? ?Acute metabolic encephalopathy-resolved.  Patient states she is back at her baseline. Home sedative medications decreased/modified as listed below. ?- PT/OT recommending SNF ? ?Hyponatremia- Na+ stable at 133 for several days. ?- monitor periodically ? ?HTN-chronic, stable ?-Continue home lisinopril ? ?GAD  depression  insomnia- chronic, stable. Without behavioral disturbances ?- decreased trazodone, lorazepam ?- continue depakote, gabapentin, ativan 0.5mg  BID, risperidone, venlafaxine, trazodone 25mg  nightly ?- melatonin nightly ? ?H/o overactive bladder- asymptomatic ?- discontinued oxybutynin ?- monitor for symptoms ? ?H/o Anemia- hgb has been stable within normal range ? ?Hypothyroidism- TSH 2/7  was wnl. Thyroid 4/7 2/8 showed several nodules on previous admission. Plan was for outpatient endocrinology follow up for biopsy but since patient has had repeat hospitalizations, will see if we can biopsy inpatient.  ?- continue home levothyroxine ?- consult for thyroid biopsy if can be done inpatient ? ?Body mass index is 35.28 kg/m?. ? ?VTE ppx: Place TED hose Start: 02/16/22 1832 ? ? ?Diet:  ?   ?Diet  ? Diet Heart Room service appropriate? Yes; Fluid consistency: Thin  ? ?Subjective 02/23/22   ? ?Pt reports that she is doing well today. She has no complaints.  ?  ?Objective  ? ?Vitals:  ? 02/22/22 0952 02/22/22 1530 02/22/22 1955 02/23/22 0451  ?BP: 120/60 133/61 133/69 139/70  ?Pulse: 91 (!) 103 81 87  ?Resp:  18 18 18   ?Temp:  98.8 ?F (37.1 ?C) 98.7 ?F (37.1 ?C) 99.1 ?F (37.3 ?C)  ?TempSrc:  Oral Oral Oral  ?SpO2: 94% 92% 94% 95%  ?Weight:      ?Height:      ? ? ?Intake/Output Summary (Last 24 hours) at 02/23/2022 0757 ?Last data filed at 02/23/2022 0450 ?Gross per 24 hour  ?Intake 480 ml  ?Output 1100 ml  ?Net -620 ml  ? ? ?Filed Weights  ? 02/16/22 2025  ?Weight: 105.2 kg  ?  ? ?Physical Exam:  ?General: awake, alert, NAD ?HEENT: atraumatic, clear conjunctiva, anicteric sclera, MMM, hearing grossly normal ?Respiratory: normal respiratory effort. ?Cardiovascular: normal S1/S2, RRR, no JVD, murmurs, quick capillary refill  ?Nervous: A&O x3. no gross focal neurologic deficits, normal speech ?Extremities: moves all equally, no edema, normal  tone ?Skin: dry, intact, normal temperature, normal color. No rashes, lesions or ulcers on exposed skin. Positive for posterior fissure of gluteal cleft  ?Psychiatry: flat mood, congruent affect ? ?Labs   ?I have personally reviewed the following labs and imaging studies ?CBC ?   ?Component Value Date/Time  ? WBC 9.3 02/18/2022 0753  ? RBC 4.17 02/18/2022 0753  ? HGB 12.9 02/18/2022 0753  ? HGB 11.3 (L) 04/28/2013 2126  ? HCT 38.8 02/18/2022 0753  ? HCT 32.9 (L) 04/28/2013 2126   ? PLT 288 02/18/2022 0753  ? PLT 339 04/28/2013 2126  ? MCV 93.0 02/18/2022 0753  ? MCV 94 04/28/2013 2126  ? MCH 30.9 02/18/2022 0753  ? MCHC 33.2 02/18/2022 0753  ? RDW 12.1 02/18/2022 0753  ? RDW 14.6 (H) 04/28/2013 2126  ? LYMPHSABS 1.2 02/16/2022 1223  ? MONOABS 0.9 02/16/2022 1223  ? EOSABS 0.0 02/16/2022 1223  ? BASOSABS 0.0 02/16/2022 1223  ? ?BMP Latest Ref Rng & Units 02/22/2022 02/20/2022 02/19/2022  ?Glucose 70 - 99 mg/dL 834(H) 962(I) 297(L)  ?BUN 8 - 23 mg/dL 14 14 17   ?Creatinine 0.44 - 1.00 mg/dL 8.92 1.19)  ?Sodium 135 - 145 mmol/L 133(L) 133(L) 133(L)  ?Potassium 3.5 - 5.1 mmol/L 4.1 4.1 4.2  ?Chloride 98 - 111 mmol/L 97(L) 95(L) 98  ?CO2 22 - 32 mmol/L 28 27 26   ?Calcium 8.9 - 10.3 mg/dL 9.1 9.2 8.9  ? ? ?No results found. ? ?Disposition Plan & Communication  ?Patient status: Observation  ?Admitted From: Home ?Planned disposition location: Skilled nursing facility ?Anticipated discharge date: TBD pending safe dispo plan. Family appealing insurance denial of SNF ? ?Family Communication: daughter on phone yesterday ?  ?Author: ?4.17(E, DO ?[facility] ?02/23/2022, 7:57 AM  ? ?Available by Epic secure chat 7AM-7PM. ?If 7PM-7AM, please contact night-coverage.  ?[facility] contact information found on [employer]. ? ?

## 2022-02-23 NOTE — Progress Notes (Signed)
Occupational Therapy Treatment ?Patient Details ?Name: Jacqueline Williams ?MRN: [id number] ?DOB: 1951-06-16 ?Today's Date: 02/23/2022 ? ? ?History of present illness Ms. Lindi Abram is a 71 year old female with medical history of obesity, hypertension, chronic pain syndrome, GERD, neuropathy, generalized anxiety, acquired scoliosis, history of neurogenic claudication due to lumbar spinal stenosis, depression, insomnia, who presents to the emergency department from [facility] for chief concerns of altered mental status and slipped out of bed landing on her buttock, on levaquin for UTI ?  ?OT comments ? Pt seen for OT Tx this date to f/u re: safety with ADLs/ADL mobility. She reports wanting to get OOB to [facility]. OT engages pt in seated bathing/dressing tasks, MAX A for standing LB bathing and peri care after BM, and MOD A for transfers/fxl mobility with RW to recliner with MAX A to control descent to sitting d/t fatiguing. Pt left with LEs elevated, all needs met and in reach with CNA present getting pt setup for meal time. Will continue to follow, continue to recommend STR f/u OT services.   ? ?Recommendations for follow up therapy are one component of a multi-disciplinary discharge planning process, led by the attending physician.  Recommendations may be updated based on patient status, additional functional criteria and insurance authorization. ?   ?Follow Up Recommendations ? Skilled nursing-short term rehab (<3 hours/day)  ?  ?Assistance Recommended at Discharge Intermittent Supervision/Assistance  ?Patient can return home with the following ? A lot of help with walking and/or transfers;A lot of help with bathing/dressing/bathroom;Help with stairs or ramp for entrance ?  ?Equipment Recommendations ? BSC/3in1  ?  ?Recommendations for Other Services   ? ?  ?Precautions / Restrictions Precautions ?Precautions: Fall ?Precaution Comments: 2 recent falls ?Restrictions ?Weight Bearing Restrictions: No  ? ? ?  ? ?Mobility Bed  Mobility ?Overal bed mobility: Needs Assistance ?Bed Mobility: Supine to Sit ?  ?  ?Supine to sit: Min guard, HOB elevated ?  ?  ?General bed mobility comments: increased time, use of rails ?  ? ?Transfers ?Overall transfer level: Needs assistance ?Equipment used: Rolling walker (2 wheels) ?Transfers: Sit to/from Stand ?Sit to Stand: Mod assist ?  ?  ?  ?  ?  ?General transfer comment: increased time, cues for use of RW. ?  ?  ?Balance Overall balance assessment: Needs assistance, History of Falls ?Sitting-balance support: Feet supported ?Sitting balance-Leahy Scale: Fair ?Sitting balance - Comments: able to sit edge of bed with close supervision ?Postural control: Posterior lean ?Standing balance support: Single extremity supported, During functional activity ?Standing balance-Leahy Scale: Poor ?Standing balance comment: initially F balance, but progressively starts flexing at the knees more and more with each small shuffle step to chair, ultimately requiring MAX A to control descent to recliner. ?  ?  ?  ?  ?  ?  ?  ?  ?  ?  ?  ?   ? ?ADL either performed or assessed with clinical judgement  ? ?ADL Overall ADL's : Needs assistance/impaired ?  ?  ?  ?  ?Upper Body Bathing: Minimal assistance;Sitting ?  ?Lower Body Bathing: Maximal assistance;Sit to/from stand ?Lower Body Bathing Details (indicate cue type and reason): MOD A to CTS, then MIN A to maintain, and MAX A for LB bathing in standing. ?Upper Body Dressing : Minimal assistance;Moderate assistance;Sitting ?Upper Body Dressing Details (indicate cue type and reason): MIN/MOD A to thread weaker L UE ?Lower Body Dressing: Maximal assistance;Total assistance;Sitting/lateral leans ?Lower Body Dressing Details (indicate cue  type and reason): to don non-skid socks ?Toilet Transfer: Maximal assistance;+2 for safety/equipment;Stand-pivot;BSC/3in1 ?  ?Toileting- Clothing Manipulation and Hygiene: Maximal assistance;Total assistance;Sit to/from stand ?Toileting -  Clothing Manipulation Details (indicate cue type and reason): posterior peri care after BM ?  ?  ?Functional mobility during ADLs: Moderate assistance;Independent;Rolling walker (2 wheels) (to take ~6 small shuffle steps from [facility] to recliner, her knees and hips do gradually start to flex more and more with each step and she is unable to control descent to chair. Requires MAX A to sit safely with posterior support) ?  ?  ? ?Extremity/Trunk Assessment   ?  ?  ?  ?  ?  ? ?Vision   ?  ?  ?Perception   ?  ?Praxis   ?  ? ?Cognition Arousal/Alertness: Awake/alert ?Behavior During Therapy: [facility] for tasks assessed/performed, Flat affect ?Overall Cognitive Status: Within Functional Limits for tasks assessed ?  ?  ?  ?  ?  ?  ?  ?  ?  ?  ?  ?  ?  ?  ?  ?  ?General Comments: oriented, able to follow commands with increasd processing time. ?  ?  ?   ?Exercises Other Exercises ?Other Exercises: OT engages pt in seated bathing/dressing tasks, MAX A for standing LB bathing and peri care, and MOD A for transfers/fxl mobility with RW to recliner with MAX A to control descent to sitting d/t fatiguing. Pt left with LEs elevated, all needs met and in reach with CNA present getting pt setup for meal time. ? ?  ?Shoulder Instructions   ? ? ?  ?General Comments    ? ? ?Pertinent Vitals/ Pain       Pain Assessment ?Pain Assessment: Faces ?Faces Pain Scale: Hurts little more ?Pain Location: low back ?Pain Descriptors / Indicators: Discomfort, Sore ?Pain Intervention(s): Limited activity within patient's tolerance, Repositioned ? ?Home Living   ?  ?  ?  ?  ?  ?  ?  ?  ?  ?  ?  ?  ?  ?  ?  ?  ?  ?  ? ?  ?Prior Functioning/Environment    ?  ?  ?  ?   ? ?Frequency ? Min 2X/week  ? ? ? ? ?  ?Progress Toward Goals ? ?OT Goals(current goals can now be found in the care plan section) ? Progress towards OT goals: Progressing toward goals ? ?Acute Rehab OT Goals ?Patient Stated Goal: to not fall ?OT Goal Formulation: With patient ?Time For Goal  Achievement: 03/04/22 ?Potential to Achieve Goals: Good  ?Plan Discharge plan remains appropriate;Frequency remains appropriate   ? ?Co-evaluation ? ? ?   ?  ?  ?  ?  ? ?  ?AM-PAC OT "6 Clicks" Daily Activity     ?Outcome Measure ? ? Help from another person eating meals?: None ?Help from another person taking care of personal grooming?: A Little ?Help from another person toileting, which includes using toliet, bedpan, or urinal?: A Lot ?Help from another person bathing (including washing, rinsing, drying)?: A Lot ?Help from another person to put on and taking off regular upper body clothing?: A Little ?Help from another person to put on and taking off regular lower body clothing?: A Lot ?6 Click Score: 16 ? ?  ?End of Session Equipment Utilized During Treatment: Rolling walker (2 wheels);Gait belt ? ?OT Visit Diagnosis: Other abnormalities of gait and mobility (R26.89);Muscle weakness (generalized) (M62.81) ?  ?Activity Tolerance Patient tolerated  treatment well ?  ?Patient Left with call bell/phone within reach;in chair;with chair alarm set ?  ?Nurse Communication Mobility status ?  ? ?   ? ?Time: 6440-3474 ?OT Time Calculation (min): 13 min ? ?Charges: OT General Charges ?$OT Visit: 1 Visit ?OT Treatments ?$Self Care/Home Management : 8-22 mins ? ?Gerrianne Scale, [place], OTR/L ?ascom [phone] ?02/23/22, 2:05 PM  ?

## 2022-02-24 DIAGNOSIS — E669 Obesity, unspecified: Secondary | ICD-10-CM | POA: Diagnosis not present

## 2022-02-24 DIAGNOSIS — E871 Hypo-osmolality and hyponatremia: Secondary | ICD-10-CM | POA: Diagnosis not present

## 2022-02-24 DIAGNOSIS — F419 Anxiety disorder, unspecified: Secondary | ICD-10-CM | POA: Diagnosis not present

## 2022-02-24 DIAGNOSIS — R410 Disorientation, unspecified: Secondary | ICD-10-CM | POA: Diagnosis not present

## 2022-02-24 NOTE — Progress Notes (Signed)
?PROGRESS NOTE ? ?Jacqueline Williams    DOB: 08-07-1951, 71 y.o.  ?OZY:[id number]  ?  Code Status: Full Code   ?DOA: 02/16/2022   LOS: 0  ? ?Brief hospital course  ?Jacqueline Williams is a 72 y.o. female with a PMH significant for HTN, chronic pain syndrome, GERD, neuropathy, GAD, acquired scoliosis, obesity, history of neurogenic claudication due to lumbar spinal stenosis, depression, insomnia. ?Jacqueline Williams presented from [facility] ALF to the ED on 02/16/2022 with AMS x 1 days. ?In the ED, it was found that Jacqueline Williams had alert and oriented x3 but fluctuating confusion and memory loss.  Additionally, she was found to be hyponatremic with sodium of 127 thought to be related to recent antibiotic use in combination of furosemide. ?Head imaging was negative for acute process including MRI. ?Jacqueline Williams were treated with 1 L bolus of NS.  ?Patient was admitted to medicine service for further workup and management of AMS as outlined in detail below. ? ?02/22/22 -stable, baseline. Medically ready for dc to SNF when bed available.  ? ?Assessment & Plan  ?Principal Problem: ?  Altered mental status ?Active Problems: ?  Hyponatremia ?  Anemia ?  Class 2 obesity ?  Hypertension ?  Anxiety and depression ?  Generalized weakness ?  Confusion ?  Leg swelling ?  Thyroid nodule greater than or equal to 1 cm in diameter incidentally noted on imaging study ? ?Acute metabolic encephalopathy-resolved.  Patient states she is back at her baseline. Home sedative medications decreased/modified as listed below. ?- PT/OT recommending SNF ? ?Hyponatremia- Na+ stable at 133 for several days. ?- monitor periodically ? ?HTN-chronic, stable ?-Continue home lisinopril ? ?GAD  depression  insomnia- chronic, stable. Without behavioral disturbances ?- decreased trazodone, lorazepam ?- continue depakote, gabapentin, ativan 0.5mg  BID, risperidone, venlafaxine, trazodone 25mg  nightly ?- melatonin nightly ? ?H/o overactive bladder- asymptomatic ?- discontinued oxybutynin ?- monitor  for symptoms ? ?H/o Anemia- hgb has been stable within normal range ? ?Hypothyroidism- TSH 2/7 was wnl. Thyroid US 2/8 showed several nodules on previous admission. Plan was for outpatient endocrinology follow up for biopsy but since patient has had repeat hospitalizations, will see if we can biopsy inpatient.  ?- continue home levothyroxine ?- consult placed for thyroid biopsy if can be done inpatient ? ?Body mass index is 35.6 kg/m?. ? ?VTE ppx: Place TED hose Start: 02/16/22 1832 ? ? ?Diet:  ?   ?Diet  ? Diet Heart Room service appropriate? Yes; Fluid consistency: Thin  ? ?Subjective 02/24/22   ? ?Pt reports no complaints today. She is doing well overall. ?  ?Objective  ? ?Vitals:  ? 02/23/22 1232 02/23/22 1700 02/23/22 2016 02/24/22 0333  ?BP: 116/67 115/72 (!) 128/52 122/60  ?Pulse: 82 78 82 91  ?Resp: 17 16 18 18   ?Temp: 98.1 ?F (36.7 ?C) 97.7 ?F (36.5 ?C) 99 ?F (37.2 ?C) 98.6 ?F (37 ?C)  ?TempSrc:   Oral   ?SpO2: 95% 97% 95% 98%  ?Weight:    106.2 kg  ?Height:      ? ? ?Intake/Output Summary (Last 24 hours) at 02/24/2022 0488 ?Last data filed at 02/24/2022 0500 ?Gross per 24 hour  ?Intake 120 ml  ?Output 2050 ml  ?Net -1930 ml  ? ? ?Filed Weights  ? 02/16/22 2025 02/24/22 0333  ?Weight: 105.2 kg 106.2 kg  ?  ? ?Physical Exam:  ?General: awake, alert, NAD ?HEENT: atraumatic, clear conjunctiva, anicteric sclera, MMM, hearing grossly normal ?Respiratory: normal respiratory effort. ?Cardiovascular: normal S1/S2, RRR,  no JVD, murmurs, quick capillary refill  ?Nervous: A&O x3. no gross focal neurologic deficits, normal speech ?Extremities: moves all equally, no edema, normal tone ?Skin: dry, intact, normal temperature, normal color. No rashes, lesions or ulcers on exposed skin. Positive for posterior fissure of gluteal cleft  ?Psychiatry: flat mood, congruent affect ? ?Labs   ?I have personally reviewed the following labs and imaging studies ?CBC ?   ?Component Value Date/Time  ? WBC 9.3 02/18/2022 0753  ? RBC 4.17  02/18/2022 0753  ? HGB 12.9 02/18/2022 0753  ? HGB 11.3 (L) 04/28/2013 2126  ? HCT 38.8 02/18/2022 0753  ? HCT 32.9 (L) 04/28/2013 2126  ? PLT 288 02/18/2022 0753  ? PLT 339 04/28/2013 2126  ? MCV 93.0 02/18/2022 0753  ? MCV 94 04/28/2013 2126  ? MCH 30.9 02/18/2022 0753  ? MCHC 33.2 02/18/2022 0753  ? RDW 12.1 02/18/2022 0753  ? RDW 14.6 (H) 04/28/2013 2126  ? LYMPHSABS 1.2 02/16/2022 1223  ? MONOABS 0.9 02/16/2022 1223  ? EOSABS 0.0 02/16/2022 1223  ? BASOSABS 0.0 02/16/2022 1223  ? ?BMP Latest Ref Rng & Units 02/22/2022 02/20/2022 02/19/2022  ?Glucose 70 - 99 mg/dL 110(H) 110(H) 115(H)  ?BUN 8 - 23 mg/dL 14 14 17   ?Creatinine 0.44 - 1.00 mg/dL 0.72 0.74 1.07(H)  ?Sodium 135 - 145 mmol/L 133(L) 133(L) 133(L)  ?Potassium 3.5 - 5.1 mmol/L 4.1 4.1 4.2  ?Chloride 98 - 111 mmol/L 97(L) 95(L) 98  ?CO2 22 - 32 mmol/L 28 27 26   ?Calcium 8.9 - 10.3 mg/dL 9.1 9.2 8.9  ? ? ?No results found. ? ?Disposition Plan & Communication  ?Patient status: Observation  ?Admitted From: Home ?Planned disposition location: Skilled nursing facility ?Anticipated discharge date: TBD pending safe dispo plan. Family appealing insurance denial of SNF ? ?Family Communication: VM for daughter ?  ?Author: ?Richarda Osmond, DO ?[facility] ?02/24/2022, 7:12 AM  ? ?Available by Epic secure chat 7AM-7PM. ?If 7PM-7AM, please contact night-coverage.  ?[facility] contact information found on [email or web address]. ? ?

## 2022-02-25 DIAGNOSIS — R410 Disorientation, unspecified: Secondary | ICD-10-CM | POA: Diagnosis not present

## 2022-02-25 DIAGNOSIS — E669 Obesity, unspecified: Secondary | ICD-10-CM | POA: Diagnosis not present

## 2022-02-25 DIAGNOSIS — F419 Anxiety disorder, unspecified: Secondary | ICD-10-CM | POA: Diagnosis not present

## 2022-02-25 DIAGNOSIS — E871 Hypo-osmolality and hyponatremia: Secondary | ICD-10-CM | POA: Diagnosis not present

## 2022-02-25 LAB — BASIC METABOLIC PANEL
Anion gap: 8 (ref 5–15)
BUN: 11 mg/dL (ref 8–23)
CO2: 29 mmol/L (ref 22–32)
Calcium: 9.1 mg/dL (ref 8.9–10.3)
Chloride: 95 mmol/L — ABNORMAL LOW (ref 98–111)
Creatinine, Ser: 0.76 mg/dL (ref 0.44–1.00)
GFR, Estimated: >60 mL/min (ref >60)
Glucose, Bld: 105 mg/dL — ABNORMAL HIGH (ref 70–99)
Potassium: 3.9 mmol/L (ref 3.5–5.1)
Sodium: 132 mmol/L — ABNORMAL LOW (ref 135–145)

## 2022-02-25 LAB — CBC
HCT: 37.3 % (ref 36.0–46.0)
Hemoglobin: 13 g/dL (ref 12.0–15.0)
MCH: 32 pg (ref 26.0–34.0)
MCHC: 34.9 g/dL (ref 30.0–36.0)
MCV: 91.9 fL (ref 80.0–100.0)
Platelets: 299 10*3/uL (ref 150–400)
RBC: 4.06 MIL/uL (ref 3.87–5.11)
RDW: 11.7 % (ref 11.5–15.5)
WBC: 7.1 10*3/uL (ref 4.0–10.5)
nRBC: 0 % (ref 0.0–0.2)

## 2022-02-25 MED ORDER — LOPERAMIDE HCL 2 MG PO CAPS
2.0000 mg | ORAL_CAPSULE | ORAL | Status: DC | PRN
  Administered 2022-02-25: 2 mg via ORAL
  Filled 2022-02-25: qty 1

## 2022-02-25 NOTE — TOC Progression Note (Signed)
Transition of Care (TOC) - Progression Note  ? ? ?Patient Details  ?Name: Jacqueline Williams ?MRN: [id number] ?Date of Birth: 03-Nov-1951 ? ?Transition of Care (TOC) CM/SW Contact  ?Caryn Section, RN ?Phone Number: ?02/25/2022, 1:37 PM ? ?Clinical Narrative:   As per [facility], patient has approval for SNF.  Call in to D. Lenise Arena from [facility], she will call back, as she is looking into their bed availability.  TOC to follow. ? ? ? ?Expected Discharge Plan: Skilled Nursing Facility ?Barriers to Discharge: SNF Authorization Denied (Pending appeal via [employer]) ? ?Expected Discharge Plan and Services ?Expected Discharge Plan: Skilled Nursing Facility ?In-house Referral: Clinical Social Work ?  ?  ?Living arrangements for the past 2 months: Assisted Living Facility ?Expected Discharge Date: 02/20/22               ?  ?  ?  ?  ?  ?  ?  ?  ?  ?  ? ? ?Social Determinants of Health (SDOH) Interventions ?  ? ?Readmission Risk Interventions ?No flowsheet data found. ? ?

## 2022-02-25 NOTE — TOC Progression Note (Signed)
Transition of Care (TOC) - Progression Note  ? ? ?Patient Details  ?Name: Jacqueline Williams ?MRN: [id number] ?Date of Birth: March 01, 1951 ? ?Transition of Care (TOC) CM/SW Contact  ? Cellar, RN ?Phone Number: ?02/25/2022, 11:57 AM ? ?Clinical Narrative:    ?Received call from [employer]-appeal overturned and SNF approved. [employer] reports faxing approval letter at this time.  ? ? ?Expected Discharge Plan: Skilled Nursing Facility ?Barriers to Discharge: SNF Authorization Denied (Pending appeal via [employer]) ? ?Expected Discharge Plan and Services ?Expected Discharge Plan: Skilled Nursing Facility ?In-house Referral: Clinical Social Work ?  ?  ?Living arrangements for the past 2 months: Assisted Living Facility ?Expected Discharge Date: 02/20/22               ?  ?  ?  ?  ?  ?  ?  ?  ?  ?  ? ? ?Social Determinants of Health (SDOH) Interventions ?  ? ?Readmission Risk Interventions ?No flowsheet data found. ? ?

## 2022-02-25 NOTE — TOC Progression Note (Addendum)
Transition of Care (TOC) - Progression Note  ? ? ?Patient Details  ?Name: Jacqueline Williams ?MRN: [id number] ?Date of Birth: 01/10/1951 ? ?Transition of Care (TOC) CM/SW Contact  ?Mary Esther Cellar, RN ?Phone Number: ?02/25/2022, 8:51 AM ? ?Clinical Narrative:    ?Confirmed with [employer] rep appeal is still pending. Anticipate result today.  ? ?1038: Call from [facility] requesting updated therapy notes-RN CM faxed requested notes to [phone]. ? ? ?Expected Discharge Plan: Skilled Nursing Facility ?Barriers to Discharge: SNF Authorization Denied (Pending appeal via [employer]) ? ?Expected Discharge Plan and Services ?Expected Discharge Plan: Skilled Nursing Facility ?In-house Referral: Clinical Social Work ?  ?  ?Living arrangements for the past 2 months: Assisted Living Facility ?Expected Discharge Date: 02/20/22               ?  ?  ?  ?  ?  ?  ?  ?  ?  ?  ? ? ?Social Determinants of Health (SDOH) Interventions ?  ? ?Readmission Risk Interventions ?No flowsheet data found. ? ?

## 2022-02-25 NOTE — Progress Notes (Signed)
Patient is having diarrhea, per the tech she has had diarrhea since Friday (this is my first day with her), I held her Miralax this morning and made MD aware, new order placed.  ?

## 2022-02-25 NOTE — Progress Notes (Signed)
?PROGRESS NOTE ? ?Jacqueline Williams    DOB: 1951/04/10, 71 y.o.  ?[id number]:[id number]  ?  Code Status: Full Code   ?DOA: 02/16/2022   LOS: 0  ? ?Brief hospital course  ?Jacqueline Williams is a 71 y.o. female with a PMH significant for HTN, chronic pain syndrome, GERD, neuropathy, GAD, acquired scoliosis, obesity, history of neurogenic claudication due to lumbar spinal stenosis, depression, insomnia. ?They presented from [facility] ALF to the ED on 02/16/2022 with AMS x 1 days. ?In the ED, it was found that they had alert and oriented x3 but fluctuating confusion and memory loss.  Additionally, she was found to be hyponatremic with sodium of 127 thought to be related to recent antibiotic use in combination of furosemide. ?Head imaging was negative for acute process including MRI. ?They were treated with 1 L bolus of NS.  ?Patient was admitted to medicine service for further workup and management of AMS as outlined in detail below. ? ?02/22/22 -stable, baseline. Medically ready for dc to SNF when bed available.  ? ?Assessment & Plan  ?Principal Problem: ?  Altered mental status ?Active Problems: ?  Hyponatremia ?  Anemia ?  Class 2 obesity ?  Hypertension ?  Anxiety and depression ?  Generalized weakness ?  Confusion ?  Leg swelling ?  Thyroid nodule greater than or equal to 1 cm in diameter incidentally noted on imaging study ? ?Acute metabolic encephalopathy-resolved.  Patient states she is back at her baseline. Home sedative medications decreased/modified as listed below. ?- PT/OT recommending SNF ? ?Hyponatremia- Na+ stable at 133 for several days. 132 today ?- monitor periodically ? ?HTN-chronic, stable ?-Continue home lisinopril ? ?GAD  depression  insomnia- chronic, stable. Without behavioral disturbances ?- decreased trazodone, lorazepam ?- continue depakote, gabapentin, ativan 0.5mg  BID, risperidone, venlafaxine, trazodone 25mg  nightly ?- melatonin nightly ? ?H/o overactive bladder- asymptomatic ?- discontinued  oxybutynin ?- monitor for symptoms ? ?H/o Anemia- hgb has been stable within normal range ? ?Hypothyroidism- TSH 2/7 was wnl. Thyroid 4/7 2/8 showed several nodules on previous admission. Plan was for outpatient endocrinology follow up for biopsy but since patient has had repeat hospitalizations, will see if we can biopsy inpatient.  ?- continue home levothyroxine ?- consult placed for thyroid biopsy if can be done inpatient ? ?Diarrhea- likely viral ?- imodium PRN ?- contact precautions ? ?Body mass index is 35.6 kg/m?. ? ?VTE ppx: Place TED hose Start: 02/16/22 1832 ? ? ?Diet:  ?   ?Diet  ? Diet Heart Room service appropriate? Yes; Fluid consistency: Thin  ? ?Subjective 02/25/22   ? ?Pt reports doing well overall. She denies complaints ?  ?Objective  ? ?Vitals:  ? 02/24/22 04/26/22 02/24/22 1529 02/24/22 2018 02/25/22 0318  ?BP: (!) 134/59 (!) 116/52 140/72 132/64  ?Pulse:  71 78 69  ?Resp:  16 16 16   ?Temp:  98.5 ?F (36.9 ?C) 97.7 ?F (36.5 ?C) 98.2 ?F (36.8 ?C)  ?TempSrc:   Oral Oral  ?SpO2:  97% 98% 98%  ?Weight:      ?Height:      ? ? ?Intake/Output Summary (Last 24 hours) at 02/25/2022 0710 ?Last data filed at 02/25/2022 0349 ?Gross per 24 hour  ?Intake --  ?Output 2000 ml  ?Net -2000 ml  ? ? ?Filed Weights  ? 02/16/22 2025 02/24/22 0333  ?Weight: 105.2 kg 106.2 kg  ?  ? ?Physical Exam:  ?General: awake, alert, NAD ?HEENT: atraumatic, clear conjunctiva, anicteric sclera, MMM, hearing grossly normal ?Respiratory: normal  respiratory effort. ?Cardiovascular: normal S1/S2, RRR, no JVD, murmurs, quick capillary refill  ?Nervous: A&O x3. no gross focal neurologic deficits, normal speech ?Extremities: moves all equally, no edema, normal tone ?Skin: dry, intact, normal temperature, normal color. No rashes, lesions or ulcers on exposed skin. Positive for posterior fissure of gluteal cleft  ?Psychiatry: flat mood, congruent affect ? ?Labs   ?I have personally reviewed the following labs and imaging studies ?CBC ?   ?Component  Value Date/Time  ? WBC 9.3 02/18/2022 0753  ? RBC 4.17 02/18/2022 0753  ? HGB 12.9 02/18/2022 0753  ? HGB 11.3 (L) 04/28/2013 2126  ? HCT 38.8 02/18/2022 0753  ? HCT 32.9 (L) 04/28/2013 2126  ? PLT 288 02/18/2022 0753  ? PLT 339 04/28/2013 2126  ? MCV 93.0 02/18/2022 0753  ? MCV 94 04/28/2013 2126  ? MCH 30.9 02/18/2022 0753  ? MCHC 33.2 02/18/2022 0753  ? RDW 12.1 02/18/2022 0753  ? RDW 14.6 (H) 04/28/2013 2126  ? LYMPHSABS 1.2 02/16/2022 1223  ? MONOABS 0.9 02/16/2022 1223  ? EOSABS 0.0 02/16/2022 1223  ? BASOSABS 0.0 02/16/2022 1223  ? ?BMP Latest Ref Rng & Units 02/22/2022 02/20/2022 02/19/2022  ?Glucose 70 - 99 mg/dL 945(O) 592(T) 244(Q)  ?BUN 8 - 23 mg/dL 14 14 17   ?Creatinine 0.44 - 1.00 mg/dL 2.86 3.81)  ?Sodium 135 - 145 mmol/L 133(L) 133(L) 133(L)  ?Potassium 3.5 - 5.1 mmol/L 4.1 4.1 4.2  ?Chloride 98 - 111 mmol/L 97(L) 95(L) 98  ?CO2 22 - 32 mmol/L 28 27 26   ?Calcium 8.9 - 10.3 mg/dL 9.1 9.2 8.9  ? ? ?No results found. ? ?Disposition Plan & Communication  ?Patient status: Observation  ?Admitted From: Home ?Planned disposition location: Skilled nursing facility ?Anticipated discharge date: SNF denial appeal has been approved. Patient is stable to discharge to SNF tomorrow. ? ?Family Communication: VM for daughter ?  ?Author: ?7.71(H, DO ?[facility] ?02/25/2022, 7:10 AM  ? ?Available by Epic secure chat 7AM-7PM. ?If 7PM-7AM, please contact night-coverage.  ?[facility] contact information found on [employer]. ? ?

## 2022-02-26 DIAGNOSIS — E871 Hypo-osmolality and hyponatremia: Secondary | ICD-10-CM | POA: Diagnosis not present

## 2022-02-26 DIAGNOSIS — F419 Anxiety disorder, unspecified: Secondary | ICD-10-CM | POA: Diagnosis not present

## 2022-02-26 DIAGNOSIS — E669 Obesity, unspecified: Secondary | ICD-10-CM | POA: Diagnosis not present

## 2022-02-26 DIAGNOSIS — R531 Weakness: Secondary | ICD-10-CM | POA: Diagnosis not present

## 2022-02-26 MED ORDER — LOPERAMIDE HCL 2 MG PO CAPS: 2.0000 mg | ORAL_CAPSULE | ORAL | 0 refills | Status: AC | PRN

## 2022-02-26 MED ORDER — TRAZODONE HCL 50 MG PO TABS: 25.0000 mg | ORAL_TABLET | Freq: Every evening | ORAL | Status: AC | PRN

## 2022-02-26 MED ORDER — POLYETHYLENE GLYCOL 3350 17 G PO PACK: 17.0000 g | PACK | Freq: Every day | ORAL | 0 refills | Status: AC | PRN

## 2022-02-26 NOTE — TOC Progression Note (Signed)
Transition of Care (TOC) - Progression Note  ? ? ?Patient Details  ?Name: Jacqueline Williams ?MRN: [id number] ?Date of Birth: [birth year] ? ?Transition of Care (TOC) CM/SW Contact  ?Caryn Section, RN ?Phone Number: ?02/26/2022, 9:45 AM ? ?Clinical Narrative:   Patient will transport to [facility] today, room 102 per Anette Riedel.  Patient's daughter, Thayer Ohm aware.  [facility] will be transporting patient.   ? ? ? ?Expected Discharge Plan: Skilled Nursing Facility ?Barriers to Discharge: SNF Authorization Denied (Pending appeal via [employer]) ? ?Expected Discharge Plan and Services ?Expected Discharge Plan: Skilled Nursing Facility ?In-house Referral: Clinical Social Work ?  ?  ?Living arrangements for the past 2 months: Assisted Living Facility ?Expected Discharge Date: 02/20/22               ?  ?  ?  ?  ?  ?  ?  ?  ?  ?  ? ? ?Social Determinants of Health (SDOH) Interventions ?  ? ?Readmission Risk Interventions ?No flowsheet data found. ? ?

## 2022-02-26 NOTE — Progress Notes (Addendum)
Called [facility] and gave report to April Copy.  Let her know that hard scripts are being sent with discharge packet.  Also let her know that patient has dentures, and is leaving with cell phone and charger, 2 rings, glasses, and medical alert box from [facility].   ?

## 2022-02-26 NOTE — Progress Notes (Incomplete)
PROGRESS NOTE  Jacqueline Williams    DOB: 1951-02-22, 71 y.o.  [id number]    Code Status: Full Code   DOA: 02/16/2022   LOS: 0   Brief hospital course  Jacqueline Williams is a 71 y.o. female with a PMH significant for HTN, chronic pain syndrome, GERD, neuropathy, GAD, acquired scoliosis, obesity, history of neurogenic claudication due to lumbar spinal stenosis, depression, insomnia. They presented from [facility] ALF to the ED on 02/16/2022 with AMS x 1 days. In the ED, it was found that they had alert and oriented x3 but fluctuating confusion and memory loss.  Additionally, she was found to be hyponatremic with sodium of 127 thought to be related to recent antibiotic use in combination of furosemide. Head imaging was negative for acute process including MRI. They were treated with 1 L bolus of NS.  Patient was admitted to medicine service for further workup and management of AMS as outlined in detail below.  02/22/22 -stable, baseline. Medically ready for dc to SNF when bed available.   Assessment & Plan  Principal Problem:   Altered mental status Active Problems:   Hyponatremia   Anemia   Class 2 obesity   Hypertension   Anxiety and depression   Generalized weakness   Confusion   Leg swelling   Thyroid nodule greater than or equal to 1 cm in diameter incidentally noted on imaging study  Acute metabolic encephalopathy-resolved.  Patient states she is back at her baseline. Home sedative medications decreased/modified as listed below. - PT/OT recommending SNF  Hyponatremia- Na+ stable at 133 for several days. 132 today - monitor periodically  HTN-chronic, stable -Continue home lisinopril  GAD   depression   insomnia- chronic, stable. Without behavioral disturbances - decreased trazodone, lorazepam - continue depakote, gabapentin, ativan 0.5mg  BID, risperidone, venlafaxine, trazodone 25mg  nightly - melatonin nightly  H/o overactive bladder- asymptomatic - discontinued  oxybutynin - monitor for symptoms  H/o Anemia- hgb has been stable within normal range  Hypothyroidism- TSH 2/7 was wnl. Thyroid 4/7 2/8 showed several nodules on previous admission. Plan was for outpatient endocrinology follow up for biopsy but since patient has had repeat hospitalizations, will see if we can biopsy inpatient.  - continue home levothyroxine - consult placed for thyroid biopsy if can be done inpatient  Diarrhea- likely viral - imodium PRN - contact precautions  Body mass index is 35.6 kg/m.  VTE ppx: Place TED hose Start: 02/16/22 1832   Diet:     Diet   Diet Heart Room service appropriate? Yes; Fluid consistency: Thin   Subjective 02/26/22    Pt reports doing well overall. She denies complaints   Objective   Vitals:   02/25/22 1100 02/25/22 1500 02/25/22 2034 02/26/22 0601  BP: 126/66 (!) 142/66 129/71 (!) 110/52  Pulse: 75 73 86 70  Resp: 20 16 16 16   Temp: 97.7 F (36.5 C) 98.4 F (36.9 C) 100.3 F (37.9 C) 99.2 F (37.3 C)  TempSrc: Oral Oral    SpO2: 98% 99% 95% 94%  Weight:      Height:        Intake/Output Summary (Last 24 hours) at 02/26/2022 0701 Last data filed at 02/26/2022 0530 Gross per 24 hour  Intake 600 ml  Output 1400 ml  Net -800 ml    Filed Weights   02/16/22 2025 02/24/22 0333  Weight: 105.2 kg 106.2 kg     Physical Exam:  General: awake, alert, NAD HEENT: atraumatic, clear conjunctiva, anicteric sclera,  MMM, hearing grossly normal Respiratory: normal respiratory effort. Cardiovascular: normal S1/S2, RRR, no JVD, murmurs, quick capillary refill  Nervous: A&O x3. no gross focal neurologic deficits, normal speech Extremities: moves all equally, no edema, normal tone Skin: dry, intact, normal temperature, normal color. No rashes, lesions or ulcers on exposed skin. Positive for posterior fissure of gluteal cleft  Psychiatry: flat mood, congruent affect  Labs   I have personally reviewed the following labs and imaging  studies CBC    Component Value Date/Time   WBC 7.1 02/25/2022 0821   RBC 4.06 02/25/2022 0821   HGB 13.0 02/25/2022 0821   HGB 11.3 (L) 04/28/2013 2126   HCT 37.3 02/25/2022 0821   HCT 32.9 (L) 04/28/2013 2126   PLT 299 02/25/2022 0821   PLT 339 04/28/2013 2126   MCV 91.9 02/25/2022 0821   MCV 94 04/28/2013 2126   MCH 32.0 02/25/2022 0821   MCHC 34.9 02/25/2022 0821   RDW 11.7 02/25/2022 0821   RDW 14.6 (H) 04/28/2013 2126   LYMPHSABS 1.2 02/16/2022 1223   MONOABS 0.9 02/16/2022 1223   EOSABS 0.0 02/16/2022 1223   BASOSABS 0.0 02/16/2022 1223   BMP Latest Ref Rng & Units 02/25/2022 02/22/2022 02/20/2022  Glucose 70 - 99 mg/dL 660(Y) 301(S) 010(X)  BUN 8 - 23 mg/dL 11 14 14   Creatinine 0.44 - 1.00 mg/dL 3.23 5.57  Sodium 135 - 145 mmol/L 132(L) 133(L) 133(L)  Potassium 3.5 - 5.1 mmol/L 3.9 4.1 4.1  Chloride 98 - 111 mmol/L 95(L) 97(L) 95(L)  CO2 22 - 32 mmol/L 29 28 27   Calcium 8.9 - 10.3 mg/dL 9.1 9.1 9.2    No results found.  Disposition Plan & Communication  Patient status: Observation  Admitted From: Home Planned disposition location: Skilled nursing facility Anticipated discharge date: SNF denial appeal has been approved. Patient is stable to discharge to SNF tomorrow.  Family Communication: VM for daughter   Author: 3.22, DO [facility] 02/26/2022, 7:01 AM   Available by Epic secure chat 7AM-7PM. If 7PM-7AM, please contact night-coverage.  [facility] contact information found on [employer].

## 2022-04-03 ENCOUNTER — Other Ambulatory Visit: Payer: Self-pay | Admitting: Family Medicine

## 2022-04-03 DIAGNOSIS — E041 Nontoxic single thyroid nodule: Secondary | ICD-10-CM

## 2022-04-12 ENCOUNTER — Ambulatory Visit
Admission: RE | Admit: 2022-04-12 | Discharge: 2022-04-12 | Disposition: A | Discharge status: Home / Self Care | Payer: Medicare PPO | Source: Ambulatory Visit | Attending: Student in an Organized Health Care Education/Training Program | Admitting: Student in an Organized Health Care Education/Training Program

## 2022-04-12 DIAGNOSIS — E041 Nontoxic single thyroid nodule: Secondary | ICD-10-CM | POA: Insufficient documentation

## 2022-04-15 LAB — CYTOLOGY - NON PAP

## 2022-07-04 IMAGING — DX DG FOOT COMPLETE 3+V*L*
3 series · 3 of 3 positions shown · non-contrast
Comparison: None.

CLINICAL DATA: Foot pain.  Recent amputation

EXAM:
LEFT FOOT - COMPLETE 3+ VIEW

[foot ap]
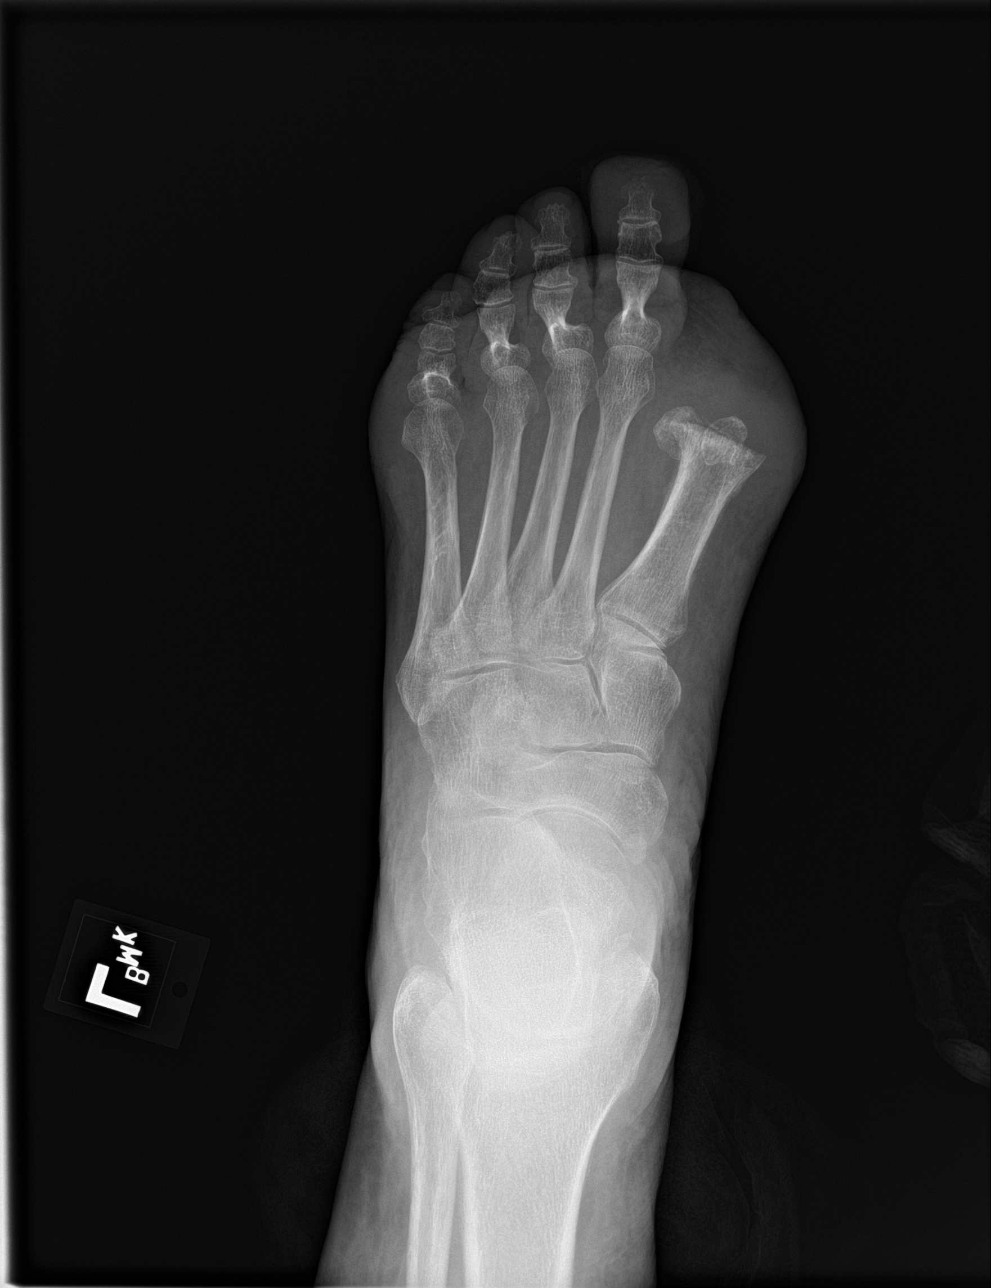

[foot obl]
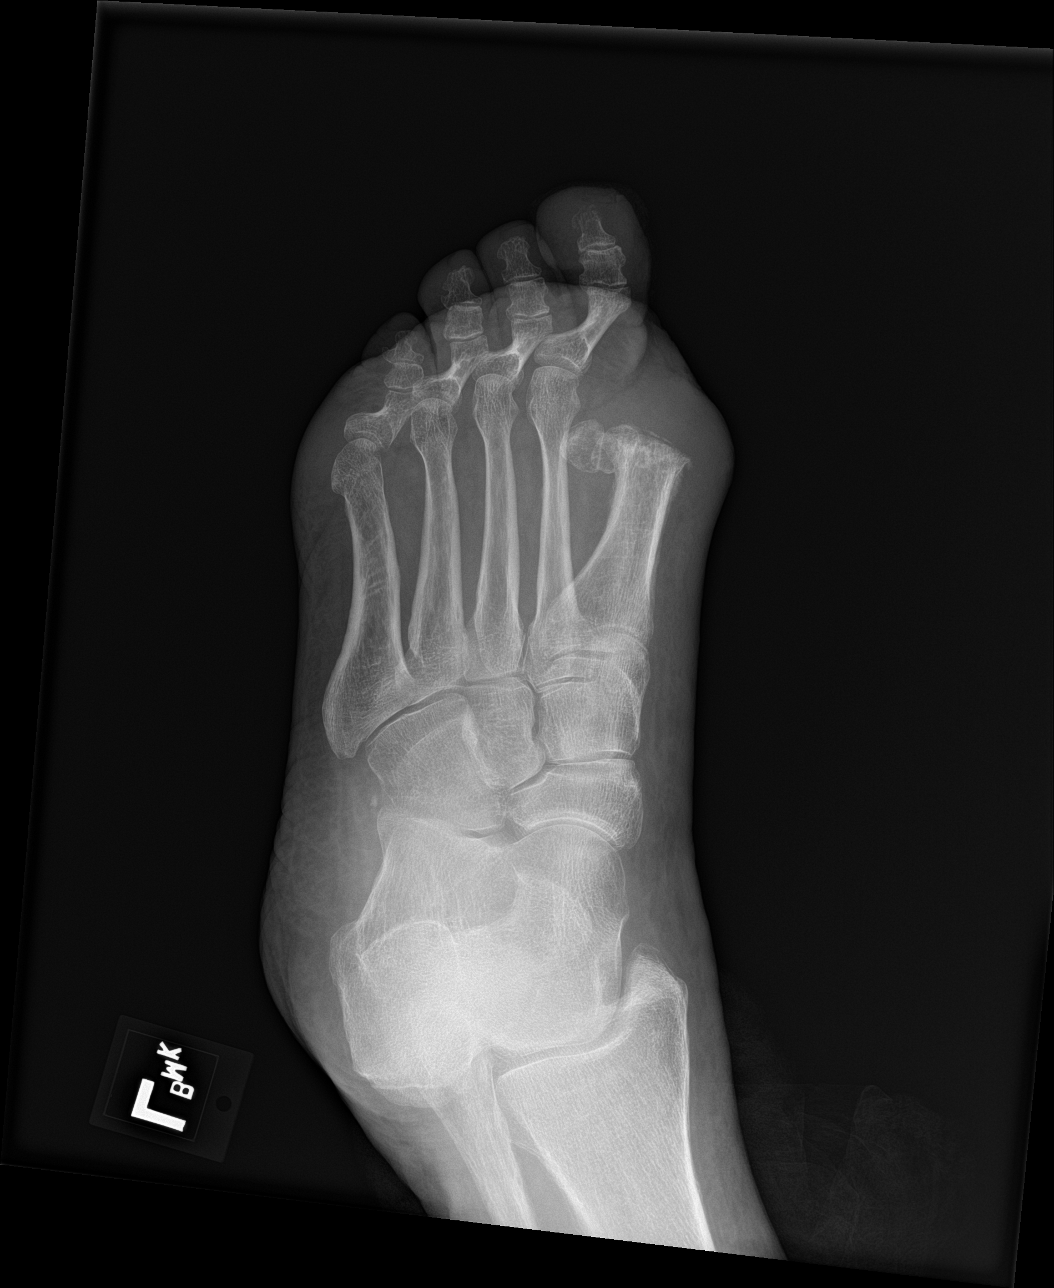

[foot lat]
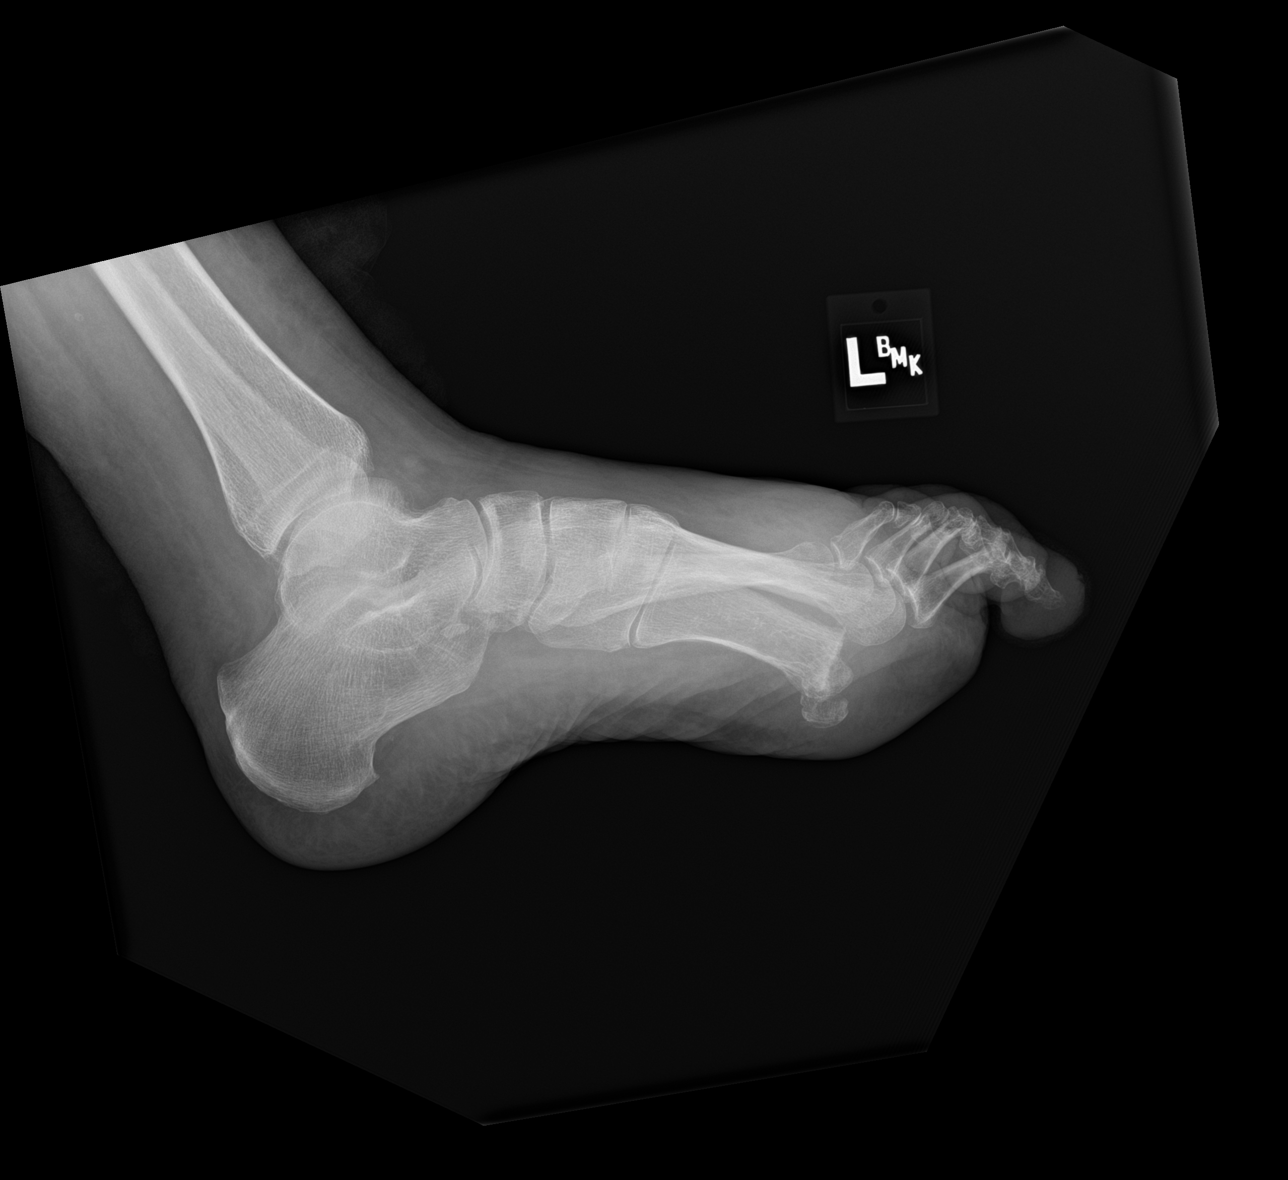

[3 of 3 positions shown; findings below may reference images not displayed]

FINDINGS: Amputation at the distal first metatarsal. No osteolysis. Small
amount of periosteal new bone formation at the amputation site.
There is also small amount of periosteal reaction at the tuft of the
second distal phalanx.
IMPRESSION: Small amount of periosteal reaction at the amputation site of the
second distal phalanx, likely postsurgical. No specific evidence of
active osteomyelitis.

## 2024-01-20 ENCOUNTER — Other Ambulatory Visit: Payer: Self-pay

## 2024-01-20 ENCOUNTER — Inpatient Hospital Stay
Admission: EM | Admit: 2024-01-20 | Discharge: 2024-01-26 | DRG: 682 | Disposition: A | Discharge status: Skilled Nursing Facility | Payer: Medicare PPO | Source: Skilled Nursing Facility | Attending: Internal Medicine | Admitting: Internal Medicine

## 2024-01-20 ENCOUNTER — Encounter: Payer: Self-pay | Admitting: Emergency Medicine

## 2024-01-20 ENCOUNTER — Emergency Department: Payer: Medicare PPO

## 2024-01-20 DIAGNOSIS — B957 Other staphylococcus as the cause of diseases classified elsewhere: Secondary | ICD-10-CM | POA: Diagnosis present

## 2024-01-20 DIAGNOSIS — G9341 Metabolic encephalopathy: Secondary | ICD-10-CM | POA: Diagnosis present

## 2024-01-20 DIAGNOSIS — R7881 Bacteremia: Secondary | ICD-10-CM

## 2024-01-20 DIAGNOSIS — B9689 Other specified bacterial agents as the cause of diseases classified elsewhere: Secondary | ICD-10-CM | POA: Diagnosis present

## 2024-01-20 DIAGNOSIS — E039 Hypothyroidism, unspecified: Secondary | ICD-10-CM | POA: Diagnosis present

## 2024-01-20 DIAGNOSIS — F0393 Unspecified dementia, unspecified severity, with mood disturbance: Secondary | ICD-10-CM | POA: Diagnosis present

## 2024-01-20 DIAGNOSIS — F0394 Unspecified dementia, unspecified severity, with anxiety: Secondary | ICD-10-CM | POA: Diagnosis present

## 2024-01-20 DIAGNOSIS — G4700 Insomnia, unspecified: Secondary | ICD-10-CM | POA: Diagnosis present

## 2024-01-20 DIAGNOSIS — R3989 Other symptoms and signs involving the genitourinary system: Secondary | ICD-10-CM

## 2024-01-20 DIAGNOSIS — G9340 Encephalopathy, unspecified: Secondary | ICD-10-CM | POA: Diagnosis not present

## 2024-01-20 DIAGNOSIS — Z880 Allergy status to penicillin: Secondary | ICD-10-CM | POA: Diagnosis not present

## 2024-01-20 DIAGNOSIS — N390 Urinary tract infection, site not specified: Secondary | ICD-10-CM | POA: Diagnosis present

## 2024-01-20 DIAGNOSIS — B9620 Unspecified Escherichia coli [E. coli] as the cause of diseases classified elsewhere: Secondary | ICD-10-CM | POA: Diagnosis present

## 2024-01-20 DIAGNOSIS — Z1611 Resistance to penicillins: Secondary | ICD-10-CM | POA: Diagnosis present

## 2024-01-20 DIAGNOSIS — Z888 Allergy status to other drugs, medicaments and biological substances status: Secondary | ICD-10-CM

## 2024-01-20 DIAGNOSIS — E876 Hypokalemia: Secondary | ICD-10-CM | POA: Diagnosis present

## 2024-01-20 DIAGNOSIS — E66812 Obesity, class 2: Secondary | ICD-10-CM | POA: Diagnosis present

## 2024-01-20 DIAGNOSIS — Z79899 Other long term (current) drug therapy: Secondary | ICD-10-CM | POA: Diagnosis not present

## 2024-01-20 DIAGNOSIS — G894 Chronic pain syndrome: Secondary | ICD-10-CM | POA: Diagnosis present

## 2024-01-20 DIAGNOSIS — F319 Bipolar disorder, unspecified: Secondary | ICD-10-CM | POA: Diagnosis present

## 2024-01-20 DIAGNOSIS — Z79890 Hormone replacement therapy (postmenopausal): Secondary | ICD-10-CM | POA: Diagnosis not present

## 2024-01-20 DIAGNOSIS — I10 Essential (primary) hypertension: Secondary | ICD-10-CM | POA: Diagnosis present

## 2024-01-20 DIAGNOSIS — R4182 Altered mental status, unspecified: Secondary | ICD-10-CM | POA: Diagnosis present

## 2024-01-20 DIAGNOSIS — M419 Scoliosis, unspecified: Secondary | ICD-10-CM | POA: Diagnosis present

## 2024-01-20 DIAGNOSIS — N179 Acute kidney failure, unspecified: Principal | ICD-10-CM | POA: Diagnosis present

## 2024-01-20 DIAGNOSIS — Z90710 Acquired absence of both cervix and uterus: Secondary | ICD-10-CM

## 2024-01-20 DIAGNOSIS — Z6834 Body mass index (BMI) 34.0-34.9, adult: Secondary | ICD-10-CM

## 2024-01-20 DIAGNOSIS — K219 Gastro-esophageal reflux disease without esophagitis: Secondary | ICD-10-CM | POA: Diagnosis present

## 2024-01-20 DIAGNOSIS — Z9884 Bariatric surgery status: Secondary | ICD-10-CM | POA: Diagnosis not present

## 2024-01-20 DIAGNOSIS — Z1623 Resistance to quinolones and fluoroquinolones: Secondary | ICD-10-CM | POA: Diagnosis present

## 2024-01-20 DIAGNOSIS — G629 Polyneuropathy, unspecified: Secondary | ICD-10-CM | POA: Diagnosis present

## 2024-01-20 DIAGNOSIS — Z886 Allergy status to analgesic agent status: Secondary | ICD-10-CM

## 2024-01-20 DIAGNOSIS — Z8619 Personal history of other infectious and parasitic diseases: Secondary | ICD-10-CM

## 2024-01-20 DIAGNOSIS — Z89422 Acquired absence of other left toe(s): Secondary | ICD-10-CM

## 2024-01-20 LAB — COMPREHENSIVE METABOLIC PANEL
ALT: 18 U/L (ref 0–44)
AST: 23 U/L (ref 15–41)
Albumin: 3.3 g/dL — ABNORMAL LOW (ref 3.5–5.0)
Alkaline Phosphatase: 76 U/L (ref 38–126)
Anion gap: 13 (ref 5–15)
BUN: 30 mg/dL — ABNORMAL HIGH (ref 8–23)
CO2: 25 mmol/L (ref 22–32)
Calcium: 8.6 mg/dL — ABNORMAL LOW (ref 8.9–10.3)
Chloride: 100 mmol/L (ref 98–111)
Creatinine, Ser: 1.64 mg/dL — ABNORMAL HIGH (ref 0.44–1.00)
GFR, Estimated: 33 mL/min — ABNORMAL LOW (ref >60)
Glucose, Bld: 99 mg/dL (ref 70–99)
Potassium: 3.1 mmol/L — ABNORMAL LOW (ref 3.5–5.1)
Sodium: 138 mmol/L (ref 135–145)
Total Bilirubin: 0.5 mg/dL (ref 0.0–1.2)
Total Protein: 6.8 g/dL (ref 6.5–8.1)

## 2024-01-20 LAB — BRAIN NATRIURETIC PEPTIDE: B Natriuretic Peptide: 25.8 pg/mL (ref 0.0–100.0)

## 2024-01-20 LAB — URINALYSIS, W/ REFLEX TO CULTURE (INFECTION SUSPECTED)
Bilirubin Urine: NEGATIVE
Glucose, UA: NEGATIVE mg/dL
Ketones, ur: NEGATIVE mg/dL
Nitrite: POSITIVE — AB
Protein, ur: NEGATIVE mg/dL
Specific Gravity, Urine: 1.018 (ref 1.005–1.030)
pH: 5 (ref 5.0–8.0)

## 2024-01-20 LAB — RESP PANEL BY RT-PCR (RSV, FLU A&B, COVID)  RVPGX2
Influenza A by PCR: NEGATIVE
Influenza B by PCR: NEGATIVE
Resp Syncytial Virus by PCR: NEGATIVE
SARS Coronavirus 2 by RT PCR: NEGATIVE

## 2024-01-20 LAB — LACTIC ACID, PLASMA: Lactic Acid, Venous: 2.4 mmol/L (ref 0.5–1.9)

## 2024-01-20 LAB — TROPONIN I (HIGH SENSITIVITY)
Troponin I (High Sensitivity): 31 ng/L — ABNORMAL HIGH (ref <18)
Troponin I (High Sensitivity): 29 ng/L — ABNORMAL HIGH (ref <18)

## 2024-01-20 LAB — CBC
HCT: 45.6 % (ref 36.0–46.0)
Hemoglobin: 15.3 g/dL — ABNORMAL HIGH (ref 12.0–15.0)
MCH: 30.4 pg (ref 26.0–34.0)
MCHC: 33.6 g/dL (ref 30.0–36.0)
MCV: 90.5 fL (ref 80.0–100.0)
Platelets: 261 10*3/uL (ref 150–400)
RBC: 5.04 MIL/uL (ref 3.87–5.11)
RDW: 13.3 % (ref 11.5–15.5)
WBC: 6.5 10*3/uL (ref 4.0–10.5)
nRBC: 0 % (ref 0.0–0.2)

## 2024-01-20 LAB — SODIUM, URINE, RANDOM: Sodium, Ur: 19 mmol/L

## 2024-01-20 LAB — PROTIME-INR
INR: 1 (ref 0.8–1.2)
Prothrombin Time: 13.5 s (ref 11.4–15.2)

## 2024-01-20 LAB — AMMONIA: Ammonia: 17 umol/L (ref 9–35)

## 2024-01-20 LAB — CREATININE, URINE, RANDOM: Creatinine, Urine: 236 mg/dL

## 2024-01-20 LAB — BLOOD GAS, VENOUS

## 2024-01-20 MED ORDER — ENOXAPARIN SODIUM 60 MG/0.6ML IJ SOSY
45.0000 mg | PREFILLED_SYRINGE | INTRAMUSCULAR | Status: DC
Start: 2024-01-20 — End: 2024-01-26
  Administered 2024-01-20 – 2024-01-25 (×6): 45 mg via SUBCUTANEOUS
  Filled 2024-01-20 (×6): qty 0.6

## 2024-01-20 MED ORDER — POTASSIUM CHLORIDE 10 MEQ/100ML IV SOLN
10.0000 meq | Freq: Once | INTRAVENOUS | Status: AC
Start: 2024-01-20 — End: 2024-01-20
  Administered 2024-01-20: 10 meq via INTRAVENOUS
  Filled 2024-01-20: qty 100

## 2024-01-20 MED ORDER — CIPROFLOXACIN IN D5W 400 MG/200ML IV SOLN
400.0000 mg | Freq: Once | INTRAVENOUS | Status: AC
  Administered 2024-01-20: 400 mg via INTRAVENOUS
  Filled 2024-01-20: qty 200

## 2024-01-20 MED ORDER — CIPROFLOXACIN IN D5W 400 MG/200ML IV SOLN: 400.0000 mg | INTRAVENOUS | Status: DC

## 2024-01-20 MED ORDER — CIPROFLOXACIN IN D5W 400 MG/200ML IV SOLN: 400.0000 mg | Freq: Two times a day (BID) | INTRAVENOUS | Status: DC

## 2024-01-20 MED ORDER — ONDANSETRON HCL 4 MG/2ML IJ SOLN: 4.0000 mg | Freq: Four times a day (QID) | INTRAMUSCULAR | Status: DC | PRN

## 2024-01-20 MED ORDER — CIPROFLOXACIN IN D5W 400 MG/200ML IV SOLN
400.0000 mg | Freq: Two times a day (BID) | INTRAVENOUS | Status: DC
  Administered 2024-01-21: 400 mg via INTRAVENOUS
  Filled 2024-01-20: qty 200

## 2024-01-20 MED ORDER — SODIUM CHLORIDE 0.9 % IV SOLN
INTRAVENOUS | Status: DC
Start: 2024-01-20 — End: 2024-01-21

## 2024-01-20 MED ORDER — LACTATED RINGERS IV BOLUS
1000.0000 mL | Freq: Once | INTRAVENOUS | Status: AC
  Administered 2024-01-20: 1000 mL via INTRAVENOUS

## 2024-01-20 MED ORDER — ONDANSETRON HCL 4 MG PO TABS: 4.0000 mg | ORAL_TABLET | Freq: Four times a day (QID) | ORAL | Status: DC | PRN

## 2024-01-20 NOTE — Assessment & Plan Note (Addendum)
On admission, BP stable. Titrate home regimen. 01-21-2024 stable. 01-22-2024 stable 01-23-2024 stable. On norvasc 10 mg daily. Can restart ACEI if BP starts to rise but while on bactrim would be cautious. 01-24-2024 stable. On norvasc 10 mg daily. 01-25-2024 stable. On norvasc 10 mg daily. 01-26-2024 BP has been stable on norvasc 10 mg daily. Change lasix to 20 mg qday prn fluid/edema. Stop lisinopril.

## 2024-01-20 NOTE — Assessment & Plan Note (Addendum)
On admission, + generalized encephalopathy on presentation. Suspect likely multifactorial w/ contributions of AKI, UTI, chronic dementia. Unclear of baseline. CT head WNL. Monitor 01-21-2024 pt is awake and alert. She knows that she has been living at [facility] for several years now. Knows that she is in the hospital. 01-22-2024 resolved. Pt is AxOx3 now

## 2024-01-20 NOTE — Progress Notes (Deleted)
PHARMACY NOTE:  ANTIMICROBIAL RENAL DOSAGE ADJUSTMENT  Current antimicrobial regimen includes a mismatch between antimicrobial dosage and estimated renal function.  As per policy approved by the Pharmacy & Therapeutics and Medical Executive Committees, the antimicrobial dosage will be adjusted accordingly.  Current antimicrobial dosage:  Ciprofloxacin 400 mg BID  Indication: Urinary track infection  Renal Function:  Estimated Creatinine Clearance: 37.3 mL/min (A) (by C-G formula based on SCr of 1.64 mg/dL (H)).  Antimicrobial dosage has been changed to:  Ciprofloxacin 400 mg daily  Additional comments: Patient currently has an AKI, monitor for improvement in renal function to dose adjust. Baseline Scr ~0.7-0.8; currently 1.64   Thank you for allowing pharmacy to be a part of this patient's care.  Effie Shy, PharmD Pharmacy Resident  01/20/2024 4:12 PM

## 2024-01-20 NOTE — ED Provider Notes (Signed)
[facility] Provider Note    Event Date/Time   First MD Initiated Contact with Patient 01/20/24 [phone]     (approximate)   History   Altered Mental Status   HPI  Jacqueline Williams is a 73 y.o. female hypertension, chronic pain syndrome, GERD, neuropathy, anxiety, depression on respite all and as needed Ativan presenting from her facility for altered mental status.  Last known normal was at 9 PM yesterday.  Per EMS she is currently on an antibiotics for UTI, was called in initially for possible stroke but for them she is ANO x 3, follows commands.  Patient states that she sometimes has leg pain, none now.  She denies pain anywhere else.  No reports of diarrhea or nausea or chest pain or shortness of breath or cough.   On independent chart review patient was admitted in March of last year for altered under status secondary to hyponatremia, altered mental status was also thought secondary to dementia, chronic psych issues as well as some medication interaction.  Physical Exam   Triage Vital Signs: ED Triage Vitals [01/20/24 0918]  Encounter Vitals Group     BP (!) 81/32     Systolic BP Percentile      Diastolic BP Percentile      Pulse Rate 66     Resp 16     Temp 99 F (37.2 C)     Temp Source Oral     SpO2 92 %     Weight 212 lb 1.6 oz (96.2 kg)     Height 5\' 6"  (1.676 m)     Head Circumference      Peak Flow      Pain Score Asleep     Pain Loc      Pain Education      Exclude from Growth Chart     Most recent vital signs: Vitals:   01/20/24 0918 01/20/24 1333  BP: (!) 81/32 (!) 146/70  Pulse: 66 64  Resp: 16 18  Temp: 99 F (37.2 C)   SpO2: 92% 97%     General: Sleepy but wakes up to voice and sternal rub, no distress.  CV:  Good peripheral perfusion.  Resp:  Normal effort.  Abd:  No distention.  Soft nontender Other:  No facial droop, no numbness, grip strength is equal bilaterally, she is able to dorsi and plantarflex her feet  bilaterally without asymmetry.  Follows commands.  No neck stiffness.   ED Results / Procedures / Treatments   Labs (all labs ordered are listed, but only abnormal results are displayed) Labs Reviewed  COMPREHENSIVE METABOLIC PANEL - Abnormal; Notable for the following components:      Result Value   Potassium 3.1 (*)    BUN 30 (*)    Creatinine, Ser 1.64 (*)    Calcium 8.6 (*)    Albumin 3.3 (*)    GFR, Estimated 33 (*)    All other components within normal limits  CBC - Abnormal; Notable for the following components:   Hemoglobin 15.3 (*)    All other components within normal limits  BLOOD GAS, VENOUS - Abnormal; Notable for the following components:   pCO2, Ven 61 (*)    pO2, Ven <31 (*)    All other components within normal limits  LACTIC ACID, PLASMA - Abnormal; Notable for the following components:   Lactic Acid, Venous 2.4 (*)    All other components within normal limits  TROPONIN I (  HIGH SENSITIVITY) - Abnormal; Notable for the following components:   Troponin I (High Sensitivity) 31 (*)    All other components within normal limits  TROPONIN I (HIGH SENSITIVITY) - Abnormal; Notable for the following components:   Troponin I (High Sensitivity) 29 (*)    All other components within normal limits  RESP PANEL BY RT-PCR (RSV, FLU A&B, COVID)  [id number]  CULTURE, BLOOD (ROUTINE X 2)  CULTURE, BLOOD (ROUTINE X 2)  BRAIN NATRIURETIC PEPTIDE  PROTIME-INR  AMMONIA  LACTIC ACID, PLASMA  URINALYSIS, W/ REFLEX TO CULTURE (INFECTION SUSPECTED)  CBG MONITORING, ED      RADIOLOGY CT head on my interpretation without any obvious intracranial hemorrhage   PROCEDURES:  Critical Care performed: No  Procedures   MEDICATIONS ORDERED IN ED: Medications  lactated ringers bolus 1,000 mL (has no administration in time range)  potassium chloride 10 mEq in 100 mL IVPB (10 mEq Intravenous New Bag/Given 01/20/24 1323)  ciprofloxacin (CIPRO) IVPB 400 mg (400 mg Intravenous New  Bag/Given 01/20/24 1325)     IMPRESSION / MDM / ASSESSMENT AND PLAN / ED COURSE  I reviewed the triage vital signs and the nursing notes.                              Differential diagnosis includes, but is not limited to, altered mental status possibly secondary to infection, UTI, pneumonia, viral illness, consider sepsis given that her triage vitals blood pressure is in the 80s, also satting 92%, on recheck in the room, pressures were systolic 120s, satting 95% on room air.  No current focal weakness or numbness or facial droop to suggest CVA, no reported trauma or falls to suggest traumatic head injury, considered medication side effects, electrolyte derangements, atypical ACS, will get CT head, chest x-ray, UA, blood cultures, labs, EKG, will give her some IV fluids.  She will need to be admitted for further management.  Patient's presentation is most consistent with acute presentation with potential threat to life or bodily function.  Given patient's overall risk given her initial hypotension and history of UTI, she will need to be admitted for further management.  Consulted hospitalist was agreeable plan for admission will evaluate the patient.  She is admitted.  Clinical Course as of 01/20/24 1502  Tue Jan 20, 2024  0953 CBC(!) No leukocytosis [TT]  1610 Comprehensive metabolic panel(!) Potassium is slightly low, will replete, sodium is normal, creatinine is elevated compared to prior. [TT]  1000 CT Head Wo Contrast IMPRESSION: No acute CT finding. Age related volume loss. Prominence of the lateral and third ventricles, minimally increased since the study of 2023. This may be due to central volume loss, but normal pressure hydrocephalus could be considered.   [TT]  1029 SBP 100s on recheck. [TT]  1233 Lactic Acid, Venous(!!): 2.4 Lactate is elevated, considered urosepsis since patient is on antibiotics for UTI.  Will put in for IV antibiotics.  Patient is anaphylactic to  penicillins, and has an AKI, will put in for IV ciprofloxacin. [TT]  1233 Resp panel by RT-PCR (RSV, Flu A&B, Covid) Anterior Nasal Swab Negative [TT]  1234 Troponin I (High Sensitivity)(!): 29 Mildly elevated but stable.  Patient has no chest pain or shortness of breath at this time. [TT]  1235 DG Chest 2 View No active cardiopulmonary disease.  [TT]  1410 On reassessment patient is a lot more awake, no complaints at this time. [TT]  Clinical Course User Index [TT] Jodie Echevaria Franchot Erichsen, MD     FINAL CLINICAL IMPRESSION(S) / ED DIAGNOSES   Final diagnoses:  Altered mental status, unspecified altered mental status type  AKI (acute kidney injury) (HCC)     Rx / DC Orders   ED Discharge Orders     None        Note:  This document was prepared using Dragon voice recognition software and may include unintentional dictation errors.    Claybon Jabs, MD 01/20/24 [phone]

## 2024-01-20 NOTE — Assessment & Plan Note (Addendum)
On admission, Cr 1.6 today w/ GFR in the 30s . Baseline Cr 0.7. Suspect prerenal etiology. Will gently hydrate patient. Check FeNa. Hold nephrotoxic agents. Follow. 01-21-2024 AKI has resolved. Today, BUN 22, scr 0.89. stop IVF 01-22-2024 BUN 10, Scr 0.64 today. 01-24-2024 stable. Scr 0.64, BUN 8. Tolerating bactrim.  01-25-2024 resolved

## 2024-01-20 NOTE — ED Triage Notes (Signed)
Pt is from [facility], pt was brought for stroke like symptoms, pt is altered in triage appears sleepy, not opening eyes when name called or gently shaken, pt's eyelid opened by this RN and pt mumbles her name, and mumbles that her legs hurtm initial bp of 81/32, repeat 73/31 in the right arm and 99/50 left arm

## 2024-01-20 NOTE — Assessment & Plan Note (Addendum)
On admission, Cont synthroid. 01-21-2024 continue synthroid 25 mg daily. 01-22-2024 stable 01-23-2024 stable 01-24-2024 stable  01-25-2024 stable

## 2024-01-20 NOTE — ED Triage Notes (Addendum)
First nurse note:  BIB [facility] from [facility] for possible stroke. LKW 9pm 1/27 Pt is caox3 and can follow all commands at baseline. Pt LVO 1 for EMS vitas as follows:  99.5 T  88 HR  95% RA 126 BGL Bed bound  Recent UTI on abx for same  126/88 18 RR  NSR on EKG    -note wrote by [facility]

## 2024-01-20 NOTE — ED Notes (Signed)
This RN to bedside to introduced self to pt, pt alert, from handoff, pt is at baseline with AMS, previous EMT Brandy did not have fall precautions on this pt, pt not on cardiac monitor, [facility] and HIV labs were not collected as they were order several hours ago, this RN will obtain. This RN implemented fall precautions and placed pt on cardiac monitor, contacted CCMD. Brief dry and on, heels floaters on pt, side rails up x2, call bell within reach, pt's room next to nurses station, door to room cracked for staff to round on pt periodically.    FALL SAFETY: Pt has yellow fall bracelet on right wrist Pt has heel floaters to bilateral feet  Bed alarm on and active Bed in lowest position & side rail up x2 Yellow fall magnet on door frame Door to RM open, staff can observe pt from nurses station Intermittent safety rounding in place.  Pt educated on fall safety plan, verbalized understanding

## 2024-01-20 NOTE — Assessment & Plan Note (Addendum)
On admission, Noted worsening lethargy and confusion w/ ? Recent treatment for UTI. Pt does report ? Dysuria and back pain. UA pending. Will empirically cover w/ IV cipro pending urine studies. Otherwise monitor. De-escalate as appropriate. 01-21-2024 have placed call to [facility] to inquire if any urine cx were sent last week when she was treated for 5 days with macrobid. Will need to ask pharmacy about abx choices given listed "anaphylaxis" to PCN. Will need to investigate if pt has ever had cephalosporins before. Urine cx E. Coli 01-06-2024 Resistant to ampicillin, cipro, levaquin Sensitive to tobra, zosyn, macrobid, bactrim, cefepime, rocephin, invanz, gent, cefazolin. 01-22-2024 after discussion with ID pharmacist. Started 3 days of Bactrim DS to treat E. Coli UTI. Monitor Scr 01-23-2024 continue with bactrim. Monitoring Scr 01-24-2024 Scr 0.64, BUN 8. Tolerating bactrim.  01-25-2024 resolved. Treated with 3 days of PO bactrim.

## 2024-01-20 NOTE — H&P (Signed)
History and Physical    Patient: Jacqueline Williams HQI:[id number] DOB: [birth year] DOA: 01/20/2024 DOS: the patient was seen and examined on 01/20/2024 PCP: Melonie Florida, FNP  Patient coming from: Home  Chief Complaint:  Chief Complaint  Patient presents with   Altered Mental Status   HPI: Jacqueline Williams is a 73 y.o. female with medical history significant of HTN, chronic pain syndrome, GERD, neuropathy, GAD, acquired scoliosis, obesity, history of neurogenic claudication due to lumbar spinal stenosis, severe depression, insomnia presenting with encephalopathy, AKI, UTI.  Limited history in the setting of encephalopathy.  Per report, patient sent from [facility] with concern for lethargy and mild confusion.  Some concern for?  CVA.  Also noted with recent UTI treatment though antibiotic regimen not specified.  No reported fevers or chills.  No reported chest pain or belly pain.  Mild back pain and?  Dysuria per patient.  No focal hemiparesis or confusion. Presented to the ER Tmax 99, blood pressure 80s to 140s over 30s to 70s.  Satting well on room air.  Blood pressure improving with IV fluids.  White count 6.5, hemoglobin 15.3, platelets 261, lactate 2.4.  Troponin 30s to 20s.  COVID flu and RSV negative.  VBG stable.  Creatinine 1.64.  CT head chest x-ray grossly stable. Review of Systems: As mentioned in the history of present illness. All other systems reviewed and are negative. Past Medical History:  Diagnosis Date   Anxiety    Back pain    Bipolar disorder (HCC)    Depression    Hypertension    Hypothyroidism    Neuropathy    Osteomyelitis of great toe of left foot (HCC) 2021   Sepsis (HCC)    Vitamin B12 deficiency    Past Surgical History:  Procedure Laterality Date   ABDOMINAL HYSTERECTOMY     AMPUTATION TOE Left 09/15/2020   Procedure: AMPUTATION TOE MPJ LEFT;  Surgeon: [name], DPM;  Location: [facility] ORS;  Service: Podiatry;  Laterality: Left;   AMPUTATION TOE Left  11/10/2020   Procedure: AMPUTATION TOE IPJ LEFT 2ND;  Surgeon: [name], DPM;  Location: [facility] ORS;  Service: Podiatry;  Laterality: Left;   CHOLECYSTECTOMY     COLONOSCOPY WITH PROPOFOL N/A 04/30/2017   Procedure: COLONOSCOPY WITH PROPOFOL;  Surgeon: Wyline Mood, MD;  Location[facility] ENDOSCOPY;  Service: Endoscopy;  Laterality: N/A;   ENTEROSCOPY N/A 05/09/2017   Procedure: ENTEROSCOPY with deplopyement of capsule for small bowel;  Surgeon: Wyline Mood, MD;  Location: [facility] ENDOSCOPY;  Service: Endoscopy;  Laterality: N/A;   ESOPHAGOGASTRODUODENOSCOPY (EGD) WITH PROPOFOL N/A 05/03/2017   Procedure: ESOPHAGOGASTRODUODENOSCOPY (EGD) WITH PROPOFOL;  Surgeon: Wyline Mood, MD;  Location: [facility] ENDOSCOPY;  Service: Gastroenterology;  Laterality: N/A;   GASTRIC BYPASS     x2   GIVENS CAPSULE STUDY N/A 05/07/2017   Procedure: GIVENS CAPSULE STUDY;  Surgeon: Wyline Mood, MD;  Location: [facility] ENDOSCOPY;  Service: Endoscopy;  Laterality: N/A;   HERNIA REPAIR     LUMBAR FUSION     Social History:  reports that she has never smoked. She has never used smokeless tobacco. She reports that she does not drink alcohol and does not use drugs.  Allergies  Allergen Reactions   Other Shortness Of Breath    caffergot   Penicillin G Sodium Anaphylaxis   Aspirin Other (See Comments)    S/p Gastric bypass   Ergotamine Tartrate Other (See Comments)   Ibuprofen Other (See Comments)    S/p gastric bypass  Ketorolac Tromethamine Nausea Only   Naproxen Nausea Only   Nsaids     Other reaction(s): Other (See Comments) GI bleed   Olanzapine Other (See Comments)   Oxaprozin Other (See Comments)   Penicillins    Seroquel [Quetiapine Fumarate] Other (See Comments)    hallucinations    Family History  Problem Relation Age of Onset   Lung cancer Father    Hypertension Neg Hx    Diabetes Neg Hx     Prior to Admission medications   Medication Sig Start Date End Date Taking? Authorizing Provider  acetaminophen  (TYLENOL) 325 MG tablet Take 650 mg by mouth every 6 (six) hours as needed.   Yes [provider]  acidophilus (RISAQUAD) CAPS capsule Take 1 capsule by mouth 2 (two) times daily.   Yes [provider]  aluminum-magnesium hydroxide-simethicone (MAALOX) 200-200-20 MG/5ML SUSP Take 30 mLs by mouth every 6 (six) hours as needed (indigestion).   Yes [provider]  azelastine (OPTIVAR) 0.05 % ophthalmic solution Place 1 drop into both eyes every 12 (twelve) hours. 11/25/23  Yes [provider]  CREON 24000-76000 units CPEP Take 2 capsules by mouth 3 (three) times daily before meals. 04/28/17  Yes [provider]  divalproex (DEPAKOTE) 250 MG DR tablet Take 250 mg by mouth 2 (two) times daily. 01/17/22  Yes [provider]  fluticasone (FLONASE) 50 MCG/ACT nasal spray Place 1 spray into both nostrils daily as needed for allergies or rhinitis.   Yes [provider]  folic acid (FOLVITE) 1 MG tablet Take 1 mg by mouth daily. 11/05/21  Yes [provider]  furosemide (LASIX) 20 MG tablet Take 20 mg by mouth daily. 01/15/24  Yes [provider]  gabapentin (NEURONTIN) 100 MG capsule Take 1 capsule (100 mg total) by mouth at bedtime as needed. 02/20/22  Yes Leeroy Bock, MD  ketoconazole (NIZORAL) 2 % cream Apply 1 Application topically every 12 (twelve) hours. 01/07/24  Yes [provider]  lactase (LACTAID) 3000 units tablet Take 3,000 Units by mouth 3 (three) times daily with meals.   Yes [provider]  levothyroxine (SYNTHROID, LEVOTHROID) 25 MCG tablet Take 25 mcg by mouth daily. 04/26/17  Yes [provider]  lisinopril (ZESTRIL) 10 MG tablet Take 10 mg by mouth daily.   Yes [provider]  loperamide (IMODIUM) 2 MG capsule Take 1 capsule (2 mg total) by mouth every 2 (two) hours as needed for diarrhea or loose stools. 02/26/22  Yes Leeroy Bock, MD  LORazepam (ATIVAN) 1 MG tablet  Take 0.5 tablets (0.5 mg total) by mouth daily as needed for anxiety. Patient taking differently: Take 1 mg by mouth 3 (three) times daily. 02/20/22  Yes Leeroy Bock, MD  melatonin 5 MG TABS Take 10 mg by mouth at bedtime. 10/30/21  Yes [provider]  ondansetron (ZOFRAN) 4 MG tablet Take 4 mg by mouth every 8 (eight) hours as needed. 02/07/22  Yes [provider]  oxyCODONE-acetaminophen (PERCOCET/ROXICET) 5-325 MG tablet Take 1 tablet by mouth 2 (two) times daily as needed. 09/24/23  Yes [provider]  polyethylene glycol (MIRALAX / GLYCOLAX) 17 g packet Take 17 g by mouth daily as needed. 02/26/22  Yes Leeroy Bock, MD  risperiDONE (RISPERDAL) 1 MG tablet Take 1-2 mg by mouth daily. Takes one 1mg  tablet and one 0.5mg  tablet together to equal 1.5mg  daily Takes one 2mg  tablet at bedtime 01/22/22  Yes [provider]  simethicone (  MYLICON) 80 MG chewable tablet Chew 80 mg by mouth every 6 (six) hours as needed for flatulence.   Yes [provider]  sodium chloride 1 g tablet Take 1 g by mouth 2 (two) times daily. 12/18/23  Yes [provider]  venlafaxine XR (EFFEXOR-XR) 150 MG 24 hr capsule Take 150 mg by mouth at bedtime.  02/25/17  Yes [provider]  cyanocobalamin (,VITAMIN B-12,) 1000 MCG/ML injection Inject 1 mL into the skin every 14 (fourteen) days.  04/16/17   [provider]  DEPO-ESTRADIOL 5 MG/ML injection Inject 0.4 mg into the muscle every 28 (twenty-eight) days.  04/22/17   [provider]  ferrous gluconate (FERGON) 324 MG tablet Take 1 tablet (324 mg total) by mouth daily with breakfast. Patient not taking: Reported on 01/20/2024 02/20/22   Leeroy Bock, MD  nitrofurantoin, macrocrystal-monohydrate, (MACROBID) 100 MG capsule Take 100 mg by mouth daily. Patient not taking: Reported on 01/20/2024 01/09/24   [provider]  traZODone (DESYREL) 50 MG tablet Take 0.5 tablets (25 mg total)  by mouth at bedtime as needed for sleep. Patient not taking: Reported on 01/20/2024 02/26/22   Leeroy Bock, MD    Physical Exam: Vitals:   01/20/24 0918 01/20/24 1333  BP: (!) 81/32 (!) 146/70  Pulse: 66 64  Resp: 16 18  Temp: 99 F (37.2 C)   TempSrc: Oral   SpO2: 92% 97%  Weight: 96.2 kg   Height: 5\' 6"  (1.676 m)    Physical Exam Constitutional:      Appearance: She is obese.     Comments: Mild generalized lethargy  HENT:     Head: Normocephalic and atraumatic.     Mouth/Throat:     Mouth: Mucous membranes are moist.  Eyes:     Pupils: Pupils are equal, round, and reactive to light.  Cardiovascular:     Rate and Rhythm: Normal rate and regular rhythm.  Pulmonary:     Effort: Pulmonary effort is normal.  Abdominal:     General: Bowel sounds are normal.  Musculoskeletal:        General: Normal range of motion.  Skin:    General: Skin is warm.  Neurological:     General: No focal deficit present.  Psychiatric:        Mood and Affect: Mood normal.     Data Reviewed:  There are no new results to review at this time.  DG Chest 2 View CLINICAL DATA:  Altered mental status  EXAM: CHEST - 2 VIEW  COMPARISON:  02/16/2022  FINDINGS: Stable cardiomediastinal contours. Aortic atherosclerosis. No focal airspace consolidation, pleural effusion, or pneumothorax.  IMPRESSION: No active cardiopulmonary disease.  Electronically Signed   By: Duanne Guess D.O.   On: 01/20/2024 11:14 CT Head Wo Contrast CLINICAL DATA:  Mental status change of unknown cause.  Somnolent.  EXAM: CT HEAD WITHOUT CONTRAST  TECHNIQUE: Contiguous axial images were obtained from the base of the skull through the vertex without intravenous contrast.  RADIATION DOSE REDUCTION: This exam was performed according to the departmental dose-optimization program which includes automated exposure control, adjustment of the mA and/or kV according to patient size and/or use of iterative  reconstruction technique.  COMPARISON:  01/29/2022  FINDINGS: Brain: Age related volume loss. No sign of old or acute focal infarction, mass, hemorrhage or extra-axial collection. There is prominence of the lateral and third ventricles, minimally increased since the study of 2023. This may be due to central volume loss,  but normal pressure hydrocephalus could be considered.  Vascular: There is atherosclerotic calcification of the major vessels at the base of the brain.  Skull: Negative  Sinuses/Orbits: Clear/normal  Other: None  IMPRESSION: No acute CT finding. Age related volume loss. Prominence of the lateral and third ventricles, minimally increased since the study of 2023. This may be due to central volume loss, but normal pressure hydrocephalus could be considered.  Electronically Signed   By: Paulina Fusi M.D.   On: 01/20/2024 09:58  Lab Results  Component Value Date   WBC 6.5 01/20/2024   HGB 15.3 (H) 01/20/2024   HCT 45.6 01/20/2024   MCV 90.5 01/20/2024   PLT 261 01/20/2024   Last metabolic panel Lab Results  Component Value Date   GLUCOSE 99 01/20/2024   NA 138 01/20/2024   K 3.1 (L) 01/20/2024   CL 100 01/20/2024   CO2 25 01/20/2024   BUN 30 (H) 01/20/2024   CREATININE 1.64 (H) 01/20/2024   GFRNONAA 33 (L) 01/20/2024   CALCIUM 8.6 (L) 01/20/2024   PHOS 2.5 02/16/2022   PROT 6.8 01/20/2024   ALBUMIN 3.3 (L) 01/20/2024   BILITOT 0.5 01/20/2024   ALKPHOS 76 01/20/2024   AST 23 01/20/2024   ALT 18 01/20/2024   ANIONGAP 13 01/20/2024    Assessment and Plan: * AKI (acute kidney injury) (HCC) Cr 1.6 today w/ GFR in the 30s  Baseline Cr 0.7  Suspect prerenal etiology  Will gently hydrate patient  Check FeNa Hold nephrotoxic agents  Follow    Altered mental status + generalized encephalopathy on presentation  Suspect likely multifactorial w/ contributions of AKI, UTI, chronic dementia  Unclear of baseline  CT head WNL   Monitor   Suspected UTI Noted worsening lethargy and confusion w/ ? Recent treatment for UTI  Pt does report ? Dysuria and back pain  UA pending  Will empirically cover w/ IV cipro pending urine studies  Otherwise monitor  De-escalate as appropriate   Hypertension BP stable  Titrate home regimen    Hypothyroidism Cont synthroid     Greater than 50% was spent in counseling and coordination of care with patient Total encounter time 80 minutes or more     Advance Care Planning:   Code Status: Full Code   Consults: None   Family Communication: No family at the bedside   Severity of Illness: The appropriate patient status for this patient is INPATIENT. Inpatient status is judged to be reasonable and necessary in order to provide the required intensity of service to ensure the patient's safety. The patient's presenting symptoms, physical exam findings, and initial radiographic and laboratory data in the context of their chronic comorbidities is felt to place them at high risk for further clinical deterioration. Furthermore, it is not anticipated that the patient will be medically stable for discharge from the hospital within 2 midnights of admission.   * I certify that at the point of admission it is my clinical judgment that the patient will require inpatient hospital care spanning beyond 2 midnights from the point of admission due to high intensity of service, high risk for further deterioration and high frequency of surveillance required.*  Author: Floydene Flock, MD 01/20/2024 4:05 PM  For on call review [email or web address].

## 2024-01-21 ENCOUNTER — Encounter: Payer: Self-pay | Admitting: Family Medicine

## 2024-01-21 DIAGNOSIS — E66812 Obesity, class 2: Secondary | ICD-10-CM

## 2024-01-21 DIAGNOSIS — G9341 Metabolic encephalopathy: Secondary | ICD-10-CM

## 2024-01-21 DIAGNOSIS — E039 Hypothyroidism, unspecified: Secondary | ICD-10-CM

## 2024-01-21 DIAGNOSIS — R3989 Other symptoms and signs involving the genitourinary system: Secondary | ICD-10-CM | POA: Diagnosis not present

## 2024-01-21 DIAGNOSIS — E876 Hypokalemia: Secondary | ICD-10-CM | POA: Diagnosis not present

## 2024-01-21 DIAGNOSIS — N179 Acute kidney failure, unspecified: Secondary | ICD-10-CM | POA: Diagnosis not present

## 2024-01-21 LAB — CBC
HCT: 42.2 % (ref 36.0–46.0)
Hemoglobin: 14.1 g/dL (ref 12.0–15.0)
MCH: 30.7 pg (ref 26.0–34.0)
MCHC: 33.4 g/dL (ref 30.0–36.0)
MCV: 91.9 fL (ref 80.0–100.0)
Platelets: 195 10*3/uL (ref 150–400)
RBC: 4.59 MIL/uL (ref 3.87–5.11)
RDW: 13.2 % (ref 11.5–15.5)
WBC: 6.1 10*3/uL (ref 4.0–10.5)
nRBC: 0 % (ref 0.0–0.2)

## 2024-01-21 LAB — BLOOD CULTURE ID PANEL (REFLEXED) - BCID2

## 2024-01-21 LAB — COMPREHENSIVE METABOLIC PANEL
ALT: 21 U/L (ref 0–44)
AST: 26 U/L (ref 15–41)
Albumin: 2.9 g/dL — ABNORMAL LOW (ref 3.5–5.0)
Alkaline Phosphatase: 70 U/L (ref 38–126)
Anion gap: 11 (ref 5–15)
BUN: 22 mg/dL (ref 8–23)
CO2: 25 mmol/L (ref 22–32)
Calcium: 8.4 mg/dL — ABNORMAL LOW (ref 8.9–10.3)
Chloride: 101 mmol/L (ref 98–111)
Creatinine, Ser: 0.89 mg/dL (ref 0.44–1.00)
GFR, Estimated: >60 mL/min (ref >60)
Glucose, Bld: 114 mg/dL — ABNORMAL HIGH (ref 70–99)
Potassium: 3.1 mmol/L — ABNORMAL LOW (ref 3.5–5.1)
Sodium: 137 mmol/L (ref 135–145)
Total Bilirubin: 0.4 mg/dL (ref 0.0–1.2)
Total Protein: 6.2 g/dL — ABNORMAL LOW (ref 6.5–8.1)

## 2024-01-21 LAB — LACTIC ACID, PLASMA: Lactic Acid, Venous: 2.1 mmol/L (ref 0.5–1.9)

## 2024-01-21 LAB — HIV ANTIBODY (ROUTINE TESTING W REFLEX): HIV Screen 4th Generation wRfx: NONREACTIVE

## 2024-01-21 MED ORDER — ACETAMINOPHEN 325 MG PO TABS
650.0000 mg | ORAL_TABLET | ORAL | Status: DC | PRN
  Administered 2024-01-25: 650 mg via ORAL
  Filled 2024-01-21: qty 2

## 2024-01-21 MED ORDER — POTASSIUM CHLORIDE CRYS ER 20 MEQ PO TBCR
40.0000 meq | EXTENDED_RELEASE_TABLET | ORAL | Status: AC
Start: 2024-01-21 — End: 2024-01-21
  Administered 2024-01-21 (×2): 40 meq via ORAL
  Filled 2024-01-21 (×2): qty 2

## 2024-01-21 MED ORDER — ENOXAPARIN SODIUM 30 MG/0.3ML IJ SOSY
PREFILLED_SYRINGE | INTRAMUSCULAR | Status: AC
  Filled 2024-01-21: qty 0.3

## 2024-01-21 MED ORDER — OXYCODONE-ACETAMINOPHEN 5-325 MG PO TABS
1.0000 | ORAL_TABLET | Freq: Two times a day (BID) | ORAL | Status: DC | PRN
  Administered 2024-01-22 – 2024-01-26 (×6): 1 via ORAL
  Filled 2024-01-21 (×5): qty 1

## 2024-01-21 MED ORDER — GABAPENTIN 100 MG PO CAPS
100.0000 mg | ORAL_CAPSULE | Freq: Every evening | ORAL | Status: DC | PRN
  Administered 2024-01-25: 100 mg via ORAL
  Filled 2024-01-21: qty 1

## 2024-01-21 MED ORDER — SODIUM CHLORIDE 1 G PO TABS
1.0000 g | ORAL_TABLET | Freq: Two times a day (BID) | ORAL | Status: DC
  Administered 2024-01-21 – 2024-01-26 (×10): 1 g via ORAL
  Filled 2024-01-21 (×9): qty 1

## 2024-01-21 MED ORDER — SULFAMETHOXAZOLE-TRIMETHOPRIM 800-160 MG PO TABS
1.0000 | ORAL_TABLET | Freq: Two times a day (BID) | ORAL | Status: AC
  Administered 2024-01-21 – 2024-01-23 (×5): 1 via ORAL
  Filled 2024-01-21 (×4): qty 1

## 2024-01-21 MED ORDER — MELATONIN 5 MG PO TABS
10.0000 mg | ORAL_TABLET | Freq: Every day | ORAL | Status: DC
  Administered 2024-01-21 – 2024-01-25 (×4): 10 mg via ORAL
  Filled 2024-01-21 (×3): qty 2

## 2024-01-21 MED ORDER — ENOXAPARIN SODIUM 40 MG/0.4ML IJ SOSY
PREFILLED_SYRINGE | INTRAMUSCULAR | Status: AC
  Filled 2024-01-21: qty 0.4

## 2024-01-21 MED ORDER — LORAZEPAM 1 MG PO TABS: 1.0000 mg | ORAL_TABLET | Freq: Three times a day (TID) | ORAL | Status: DC | PRN

## 2024-01-21 MED ORDER — MELATONIN 5 MG PO TABS
ORAL_TABLET | ORAL | Status: AC
  Filled 2024-01-21: qty 2

## 2024-01-21 MED ORDER — PANCRELIPASE (LIP-PROT-AMYL) 12000-38000 UNITS PO CPEP
48000.0000 [IU] | ORAL_CAPSULE | Freq: Three times a day (TID) | ORAL | Status: DC
  Administered 2024-01-21 – 2024-01-26 (×13): 48000 [IU] via ORAL
  Filled 2024-01-21 (×6): qty 1
  Filled 2024-01-21 (×3): qty 4
  Filled 2024-01-21: qty 1
  Filled 2024-01-21: qty 4
  Filled 2024-01-21 (×3): qty 1

## 2024-01-21 MED ORDER — RISPERIDONE 1 MG PO TABS
2.0000 mg | ORAL_TABLET | Freq: Every day | ORAL | Status: DC
  Administered 2024-01-21 – 2024-01-25 (×4): 2 mg via ORAL
  Filled 2024-01-21 (×5): qty 2

## 2024-01-21 MED ORDER — DIVALPROEX SODIUM 250 MG PO DR TAB
250.0000 mg | DELAYED_RELEASE_TABLET | Freq: Two times a day (BID) | ORAL | Status: DC
Start: 2024-01-21 — End: 2024-01-26
  Administered 2024-01-21 – 2024-01-26 (×9): 250 mg via ORAL
  Filled 2024-01-21 (×9): qty 1

## 2024-01-21 MED ORDER — VENLAFAXINE HCL ER 75 MG PO CP24
150.0000 mg | ORAL_CAPSULE | Freq: Every day | ORAL | Status: DC
  Administered 2024-01-21 – 2024-01-25 (×4): 150 mg via ORAL
  Filled 2024-01-21: qty 2
  Filled 2024-01-21 (×2): qty 1
  Filled 2024-01-21: qty 2
  Filled 2024-01-21: qty 1

## 2024-01-21 NOTE — Progress Notes (Signed)
PROGRESS NOTE    ADELAE YODICE  UJW:[id number] DOB: 11/11/1951 DOA: 01/20/2024 PCP: Melonie Florida, FNP  Subjective: Pt seen and examined. Treated with macrobid for 5 days. Last dose on 01-13-2024. I called [facility] [place] to inquire about urine cultures. They are going to call back to results.  Pt feeling better. Scr has returned back to normal.  Pt does not recall her allergy to PCN but EPIC allergy lists anaphylaxis to PCN. Pt started on IV cipro.  Pt has been a resident of [facility] since 02-2022. She is a long term resident.  I think her staph species in blood cx is a contaminant.   Hospital Course: HPI: LAKENYA RIENDEAU is a 73 y.o. female with medical history significant of HTN, chronic pain syndrome, GERD, neuropathy, GAD, acquired scoliosis, obesity, history of neurogenic claudication due to lumbar spinal stenosis, severe depression, insomnia presenting with encephalopathy, AKI, UTI.  Limited history in the setting of encephalopathy.  Per report, patient sent from [facility] with concern for lethargy and mild confusion.  Some concern for?  CVA.  Also noted with recent UTI treatment though antibiotic regimen not specified.  No reported fevers or chills.  No reported chest pain or belly pain.  Mild back pain and?  Dysuria per patient.  No focal hemiparesis or confusion.   Significant Events: Admitted 01/20/2024 for AKI   Significant Labs: White count 6.5, hemoglobin 15.3, platelets 261, lactate 2.4. Troponin 30s to 20s. COVID flu and RSV negative. VBG stable. Creatinine 1.64.   Significant Imaging Studies: CT head shows No acute CT finding. Age related volume loss. Prominence of the lateral and third ventricles, minimally increased since the study of 2023. This may be due to central volume loss, but normal pressure hydrocephalus could be considered CXR shows No active cardiopulmonary disease   Antibiotic Therapy: Anti-infectives (From admission, onward)     Start     Dose/Rate Route Frequency Ordered Stop   01/21/24 1330  ciprofloxacin (CIPRO) IVPB 400 mg  Status:  Discontinued        400 mg 200 mL/hr over 60 Minutes Intravenous Every 24 hours 01/20/24 1615 01/20/24 1621   01/21/24 0200  ciprofloxacin (CIPRO) IVPB 400 mg        400 mg 200 mL/hr over 60 Minutes Intravenous Every 12 hours 01/20/24 1621     01/20/24 2300  ciprofloxacin (CIPRO) IVPB 400 mg  Status:  Discontinued        400 mg 200 mL/hr over 60 Minutes Intravenous Every 12 hours 01/20/24 1605 01/20/24 1615   01/20/24 1215  ciprofloxacin (CIPRO) IVPB 400 mg        400 mg 200 mL/hr over 60 Minutes Intravenous  Once 01/20/24 1204 01/20/24 1425       Procedures:   Consultants:     Assessment and Plan: * AKI (acute kidney injury) (HCC) On admission, Cr 1.6 today w/ GFR in the 30s . Baseline Cr 0.7. Suspect prerenal etiology. Will gently hydrate patient. Check FeNa. Hold nephrotoxic agents. Follow.  01-21-2024 AKI has resolved. Today, BUN 22, scr 0.89. stop IVF   Suspected UTI On admission, Noted worsening lethargy and confusion w/ ? Recent treatment for UTI. Pt does report ? Dysuria and back pain. UA pending. Will empirically cover w/ IV cipro pending urine studies. Otherwise monitor. De-escalate as appropriate.  01-21-2024 have placed call to [facility] to inquire if any urine cx were sent last week when she was treated for  5 days with macrobid. Will need to ask pharmacy about abx choices given listed "anaphylaxis" to PCN. Will need to investigate if pt has ever had cephalosporins before.  Urine cx E. Coli 01-06-2024 Resistant to ampicillin, cipro, levaquin Sensitive to tobra, zosyn, macrobid, bactrim, cefepime, rocephin, invanz, gent, cefazolin.  Acute metabolic encephalopathy On admission, + generalized encephalopathy on presentation. Suspect likely multifactorial w/ contributions of AKI, UTI, chronic dementia. Unclear of baseline. CT head WNL. Monitor  01-21-2024  pt is awake and alert. She knows that she has been living at [facility] for several years now. Knows that she is in the hospital.   Hypokalemia 01-21-2024 replete with po kcl 40 meq q4h prn.  Benign essential HTN On admission, BP stable. Titrate home regimen.  01-21-2024    Class 2 obesity 01-21-2024 BMI 34.23  Hypothyroidism On admission, Cont synthroid.  01-21-2024 continue synthroid 25 mg daily.     DVT prophylaxis:   Lovenox   Code Status: Full Code Family Communication: no family at bedside Disposition Plan: return to SNF Reason for continuing need for hospitalization: remains on IV ABX. Awaiting urine cx  Objective: Vitals:   01/21/24 0300 01/21/24 0400 01/21/24 0500 01/21/24 0600  BP: (!) 146/64 (!) 141/58 (!) 139/58 (!) 143/60  Pulse: 73 69 64 71  Resp: (!) 22 (!) 21 (!) 21 (!) 21  Temp:   98.8 F (37.1 C)   TempSrc:   Oral   SpO2: 98% 96% 96% 96%  Weight:      Height:        Intake/Output Summary (Last 24 hours) at 01/21/2024 0934 Last data filed at 01/21/2024 0336 Gross per 24 hour  Intake 1200 ml  Output --  Net 1200 ml   Filed Weights   01/20/24 0918  Weight: 96.2 kg    Examination:  Physical Exam Vitals and nursing note reviewed.  Constitutional:      General: She is not in acute distress.    Appearance: She is obese. She is not toxic-appearing or diaphoretic.  HENT:     Head: Normocephalic and atraumatic.     Nose: Nose normal.  Eyes:     General: No scleral icterus. Cardiovascular:     Rate and Rhythm: Normal rate and regular rhythm.  Pulmonary:     Effort: Pulmonary effort is normal.     Breath sounds: Normal breath sounds.  Abdominal:     General: Bowel sounds are normal. There is no distension.     Palpations: Abdomen is soft.     Tenderness: There is no abdominal tenderness.  Musculoskeletal:     Right lower leg: No edema.     Left lower leg: No edema.  Skin:    General: Skin is warm and dry.     Capillary Refill:  Capillary refill takes less than 2 seconds.  Neurological:     Cranial Nerves: No cranial nerve deficit.     Data Reviewed: I have personally reviewed following labs and imaging studies  CBC: Recent Labs  Lab 01/20/24 0903 01/21/24 0455  WBC 6.5 6.1  HGB 15.3* 14.1  HCT 45.6 42.2  MCV 90.5 91.9  PLT 261 195   Basic Metabolic Panel: Recent Labs  Lab 01/20/24 0903 01/21/24 0455  NA 138 137  K 3.1* 3.1*  CL 100 101  CO2 25 25  GLUCOSE 99 114*  BUN 30* 22  CREATININE 1.64* 0.89  CALCIUM 8.6* 8.4*   GFR: Estimated Creatinine Clearance: 68.8 mL/min (by C-G formula based  on SCr of 0.89 mg/dL). Liver Function Tests: Recent Labs  Lab 01/20/24 0903 01/21/24 0455  AST 23 26  ALT 18 21  ALKPHOS 76 70  BILITOT 0.5 0.4  PROT 6.8 6.2*  ALBUMIN 3.3* 2.9*    Recent Labs  Lab 01/20/24 1020  AMMONIA 17   Coagulation Profile: Recent Labs  Lab 01/20/24 0903  INR 1.0   BNP (last 3 results) Recent Labs    01/20/24 0903  BNP 25.8   Sepsis Labs: Recent Labs  Lab 01/20/24 1124  LATICACIDVEN 2.4*    Recent Results (from the past 240 hours)  Blood culture (routine x 2)     Status: None (Preliminary result)   Collection Time: 01/20/24 10:54 AM   Specimen: BLOOD RIGHT FOREARM  Result Value Ref Range Status   Specimen Description   Final    BLOOD RIGHT FOREARM Performed at [facility], [address] [place], [place] [address]    Special Requests   Final    BOTTLES DRAWN AEROBIC AND ANAEROBIC Blood Culture results may not be optimal due to an inadequate volume of blood received in culture bottles   Culture  Setup Time   Final    Organism ID to follow GRAM POSITIVE COCCI IN BOTH AEROBIC AND ANAEROBIC BOTTLES CRITICAL RESULT CALLED TO, READ BACK BY AND VERIFIED WITH: Princella Ion 01/21/24 0504 MW Performed at [facility], [address] [place], [place] [address]    Culture GRAM POSITIVE COCCI  Final   Report Status PENDING  Incomplete   Blood Culture ID Panel (Reflexed)     Status: Abnormal   Collection Time: 01/20/24 10:54 AM  Result Value Ref Range Status   Enterococcus faecalis NOT DETECTED NOT DETECTED Final   Enterococcus Faecium NOT DETECTED NOT DETECTED Final   Listeria monocytogenes NOT DETECTED NOT DETECTED Final   Staphylococcus species DETECTED (A) NOT DETECTED Final    Comment: CRITICAL RESULT CALLED TO, READ BACK BY AND VERIFIED WITH: JASON ROBBINS 01/21/24 0504 MW    Staphylococcus aureus (BCID) NOT DETECTED NOT DETECTED Final   Staphylococcus epidermidis NOT DETECTED NOT DETECTED Final   Staphylococcus lugdunensis NOT DETECTED NOT DETECTED Final   Streptococcus species NOT DETECTED NOT DETECTED Final   Streptococcus agalactiae NOT DETECTED NOT DETECTED Final   Streptococcus pneumoniae NOT DETECTED NOT DETECTED Final   Streptococcus pyogenes NOT DETECTED NOT DETECTED Final   A.calcoaceticus-baumannii NOT DETECTED NOT DETECTED Final   Bacteroides fragilis NOT DETECTED NOT DETECTED Final   Enterobacterales NOT DETECTED NOT DETECTED Final   Enterobacter cloacae complex NOT DETECTED NOT DETECTED Final   Escherichia coli NOT DETECTED NOT DETECTED Final   Klebsiella aerogenes NOT DETECTED NOT DETECTED Final   Klebsiella oxytoca NOT DETECTED NOT DETECTED Final   Klebsiella pneumoniae NOT DETECTED NOT DETECTED Final   Proteus species NOT DETECTED NOT DETECTED Final   Salmonella species NOT DETECTED NOT DETECTED Final   Serratia marcescens NOT DETECTED NOT DETECTED Final   Haemophilus influenzae NOT DETECTED NOT DETECTED Final   Neisseria meningitidis NOT DETECTED NOT DETECTED Final   Pseudomonas aeruginosa NOT DETECTED NOT DETECTED Final   Stenotrophomonas maltophilia NOT DETECTED NOT DETECTED Final   Candida albicans NOT DETECTED NOT DETECTED Final   Candida auris NOT DETECTED NOT DETECTED Final   Candida glabrata NOT DETECTED NOT DETECTED Final   Candida krusei NOT DETECTED NOT DETECTED Final   Candida  parapsilosis NOT DETECTED NOT DETECTED Final   Candida tropicalis NOT DETECTED NOT DETECTED Final  Cryptococcus neoformans/gattii NOT DETECTED NOT DETECTED Final    Comment: Performed at [facility], [address] [place], [place] [address]  Resp panel by RT-PCR (RSV, Flu A&B, Covid) Anterior Nasal Swab     Status: None   Collection Time: 01/20/24 11:00 AM   Specimen: Anterior Nasal Swab  Result Value Ref Range Status   SARS Coronavirus 2 by RT PCR NEGATIVE NEGATIVE Final    Comment: (NOTE) SARS-CoV-2 target nucleic acids are NOT DETECTED.  The SARS-CoV-2 RNA is generally detectable in upper respiratory specimens during the acute phase of infection. The lowest concentration of SARS-CoV-2 viral copies this assay can detect is 138 copies/mL. A negative result does not preclude SARS-Cov-2 infection and should not be used as the sole basis for treatment or other patient management decisions. A negative result may occur with  improper specimen collection/handling, submission of specimen other than nasopharyngeal swab, presence of viral mutation(s) within the areas targeted by this assay, and inadequate number of viral copies(<138 copies/mL). A negative result must be combined with clinical observations, patient history, and epidemiological information. The expected result is Negative.  Fact Sheet for Patients:  BloggerCourse.com  Fact Sheet for Healthcare Providers:  SeriousBroker.it  This test is no t yet approved or cleared by the [place] FDA and  has been authorized for detection and/or diagnosis of SARS-CoV-2 by FDA under an Emergency Use Authorization (EUA). This EUA will remain  in effect (meaning this test can be used) for the duration of the COVID-19 declaration under Section 564(b)(1) of the Act, 21 U.S.C.section 360bbb-3(b)(1), unless the authorization is terminated  or revoked sooner.       Influenza A  by PCR NEGATIVE NEGATIVE Final   Influenza B by PCR NEGATIVE NEGATIVE Final    Comment: (NOTE) The Xpert Xpress SARS-CoV-2/FLU/RSV plus assay is intended as an aid in the diagnosis of influenza from Nasopharyngeal swab specimens and should not be used as a sole basis for treatment. Nasal washings and aspirates are unacceptable for Xpert Xpress SARS-CoV-2/FLU/RSV testing.  Fact Sheet for Patients: BloggerCourse.com  Fact Sheet for Healthcare Providers: SeriousBroker.it  This test is not yet approved or cleared by the [place] FDA and has been authorized for detection and/or diagnosis of SARS-CoV-2 by FDA under an Emergency Use Authorization (EUA). This EUA will remain in effect (meaning this test can be used) for the duration of the COVID-19 declaration under Section 564(b)(1) of the Act, 21 U.S.C. section 360bbb-3(b)(1), unless the authorization is terminated or revoked.     Resp Syncytial Virus by PCR NEGATIVE NEGATIVE Final    Comment: (NOTE) Fact Sheet for Patients: BloggerCourse.com  Fact Sheet for Healthcare Providers: SeriousBroker.it  This test is not yet approved or cleared by the [place] FDA and has been authorized for detection and/or diagnosis of SARS-CoV-2 by FDA under an Emergency Use Authorization (EUA). This EUA will remain in effect (meaning this test can be used) for the duration of the COVID-19 declaration under Section 564(b)(1) of the Act, 21 U.S.C. section 360bbb-3(b)(1), unless the authorization is terminated or revoked.  Performed at [facility], [address] [place], [place] [address]   Blood culture (routine x 2)     Status: None (Preliminary result)   Collection Time: 01/20/24  9:02 PM   Specimen: BLOOD  Result Value Ref Range Status   Specimen Description BLOOD LEFT WRIST  Final   Special Requests   Final    BOTTLES  DRAWN AEROBIC AND ANAEROBIC Blood Culture  adequate volume   Culture   Final    NO GROWTH < 12 HOURS Performed at [facility], [address] [place], [place] [address]    Report Status PENDING  Incomplete     Radiology Studies: DG Chest 2 View Result Date: 01/20/2024 CLINICAL DATA:  Altered mental status EXAM: CHEST - 2 VIEW COMPARISON:  02/16/2022 FINDINGS: Stable cardiomediastinal contours. Aortic atherosclerosis. No focal airspace consolidation, pleural effusion, or pneumothorax. IMPRESSION: No active cardiopulmonary disease. Electronically Signed   By: Duanne Guess D.O.   On: 01/20/2024 11:14   CT Head Wo Contrast Result Date: 01/20/2024 CLINICAL DATA:  Mental status change of unknown cause.  Somnolent. EXAM: CT HEAD WITHOUT CONTRAST TECHNIQUE: Contiguous axial images were obtained from the base of the skull through the vertex without intravenous contrast. RADIATION DOSE REDUCTION: This exam was performed according to the departmental dose-optimization program which includes automated exposure control, adjustment of the mA and/or kV according to patient size and/or use of iterative reconstruction technique. COMPARISON:  01/29/2022 FINDINGS: Brain: Age related volume loss. No sign of old or acute focal infarction, mass, hemorrhage or extra-axial collection. There is prominence of the lateral and third ventricles, minimally increased since the study of 2023. This may be due to central volume loss, but normal pressure hydrocephalus could be considered. Vascular: There is atherosclerotic calcification of the major vessels at the base of the brain. Skull: Negative Sinuses/Orbits: Clear/normal Other: None IMPRESSION: No acute CT finding. Age related volume loss. Prominence of the lateral and third ventricles, minimally increased since the study of 2023. This may be due to central volume loss, but normal pressure hydrocephalus could be considered. Electronically Signed   By: Paulina Fusi  M.D.   On: 01/20/2024 09:58    Scheduled Meds:  divalproex  250 mg Oral BID   enoxaparin (LOVENOX) injection  45 mg Subcutaneous Q24H   lipase/protease/amylase  48,000 Units Oral TID AC   melatonin  10 mg Oral QHS   potassium chloride  40 mEq Oral Q4H   risperiDONE  2 mg Oral QHS   sodium chloride  1 g Oral BID   venlafaxine XR  150 mg Oral QHS   Continuous Infusions:  sodium chloride 75 mL/hr at 01/21/24 0448   ciprofloxacin Stopped (01/21/24 0336)     LOS: 1 day   Time spent: 40 minutes  Carollee Herter, DO  [facility]  01/21/2024, 9:34 AM

## 2024-01-21 NOTE — Assessment & Plan Note (Signed)
01-21-2024 replete with po kcl 40 meq q4h prn.  01-22-2024 resolved. K 3.6 today

## 2024-01-21 NOTE — ED Notes (Signed)
Pt's daughter appears agitated and verbally aggressive, stated "I got in a fight with my husband tonight and my dad died at this hospital" stating she is "I apologize that I come off mean and I know I am a professional, but I am not doing good with my mother here and I was arguing with my husband earlier, I am not okay". Advised understandable that she may be upset regarding a different matter, however cannot speak to this RN rudely, gave update regarding pt's mother. Daughter would like to get pt something to eat, advised on heart healthy diet and the cafeteria is remain open until 1-1:30 am, may go to cafeteria to get her something to eat based on her mother's diet order. Wants to get mother a hotdog, suggest something more suitable like a grilled chicken, daughter reports "I need to go home I'm can't stay here, can you take this money and get her food", apologize to daughter as I cannot accept money from pt or family member to order them food. Suggest going to cafeteria while they are currently still open, daughter does not want to walk that far, stated she may go get outside food for pt.

## 2024-01-21 NOTE — Progress Notes (Signed)
PHARMACY - PHYSICIAN COMMUNICATION CRITICAL VALUE ALERT - BLOOD CULTURE IDENTIFICATION (BCID)  Jacqueline Williams is an 73 y.o. female who presented to [facility] on 01/20/2024 with a chief complaint of AKI, UTI.   Assessment:  Staph species in 1 of 4 bottles, aerobic, no resistance detected. Most likely a contaminant.  (include suspected source if known)  Name of physician (or Provider) Contacted: Larkin Ina, NP   Current antibiotics: Cipro 400 mg IV Q12H  Changes to prescribed antibiotics recommended:  Patient is on recommended antibiotics - No changes needed  Results for orders placed or performed during the hospital encounter of 01/20/24  Blood Culture ID Panel (Reflexed) (Collected: 01/20/2024 10:54 AM)  Result Value Ref Range   Enterococcus faecalis NOT DETECTED NOT DETECTED   Enterococcus Faecium NOT DETECTED NOT DETECTED   Listeria monocytogenes NOT DETECTED NOT DETECTED   Staphylococcus species DETECTED (A) NOT DETECTED   Staphylococcus aureus (BCID) NOT DETECTED NOT DETECTED   Staphylococcus epidermidis NOT DETECTED NOT DETECTED   Staphylococcus lugdunensis NOT DETECTED NOT DETECTED   Streptococcus species NOT DETECTED NOT DETECTED   Streptococcus agalactiae NOT DETECTED NOT DETECTED   Streptococcus pneumoniae NOT DETECTED NOT DETECTED   Streptococcus pyogenes NOT DETECTED NOT DETECTED   A.calcoaceticus-baumannii NOT DETECTED NOT DETECTED   Bacteroides fragilis NOT DETECTED NOT DETECTED   Enterobacterales NOT DETECTED NOT DETECTED   Enterobacter cloacae complex NOT DETECTED NOT DETECTED   Escherichia coli NOT DETECTED NOT DETECTED   Klebsiella aerogenes NOT DETECTED NOT DETECTED   Klebsiella oxytoca NOT DETECTED NOT DETECTED   Klebsiella pneumoniae NOT DETECTED NOT DETECTED   Proteus species NOT DETECTED NOT DETECTED   Salmonella species NOT DETECTED NOT DETECTED   Serratia marcescens NOT DETECTED NOT DETECTED   Haemophilus influenzae NOT DETECTED NOT DETECTED   Neisseria  meningitidis NOT DETECTED NOT DETECTED   Pseudomonas aeruginosa NOT DETECTED NOT DETECTED   Stenotrophomonas maltophilia NOT DETECTED NOT DETECTED   Candida albicans NOT DETECTED NOT DETECTED   Candida auris NOT DETECTED NOT DETECTED   Candida glabrata NOT DETECTED NOT DETECTED   Candida krusei NOT DETECTED NOT DETECTED   Candida parapsilosis NOT DETECTED NOT DETECTED   Candida tropicalis NOT DETECTED NOT DETECTED   Cryptococcus neoformans/gattii NOT DETECTED NOT DETECTED    Florabelle Cardin D 01/21/2024  5:19 AM

## 2024-01-21 NOTE — Subjective & Objective (Addendum)
Pt seen and examined. Stable. Ready for DC.

## 2024-01-21 NOTE — Hospital Course (Signed)
HPI: Jacqueline Williams is a 73 y.o. female with medical history significant of HTN, chronic pain syndrome, GERD, neuropathy, GAD, acquired scoliosis, obesity, history of neurogenic claudication due to lumbar spinal stenosis, severe depression, insomnia presenting with encephalopathy, AKI, UTI.  Limited history in the setting of encephalopathy.  Per report, patient sent from [facility] with concern for lethargy and mild confusion.  Some concern for?  CVA.  Also noted with recent UTI treatment though antibiotic regimen not specified.  No reported fevers or chills.  No reported chest pain or belly pain.  Mild back pain and?  Dysuria per patient.  No focal hemiparesis or confusion.   Significant Events: Admitted 01/20/2024 for AKI   Significant Labs: White count 6.5, hemoglobin 15.3, platelets 261, lactate 2.4. Troponin 30s to 20s. COVID flu and RSV negative. VBG stable. Creatinine 1.64.   Significant Imaging Studies: CT head shows No acute CT finding. Age related volume loss. Prominence of the lateral and third ventricles, minimally increased since the study of 2023. This may be due to central volume loss, but normal pressure hydrocephalus could be considered CXR shows No active cardiopulmonary disease   Antibiotic Therapy: Anti-infectives (From admission, onward)    Start     Dose/Rate Route Frequency Ordered Stop   01/21/24 1330  ciprofloxacin (CIPRO) IVPB 400 mg  Status:  Discontinued        400 mg 200 mL/hr over 60 Minutes Intravenous Every 24 hours 01/20/24 1615 01/20/24 1621   01/21/24 0200  ciprofloxacin (CIPRO) IVPB 400 mg        400 mg 200 mL/hr over 60 Minutes Intravenous Every 12 hours 01/20/24 1621     01/20/24 2300  ciprofloxacin (CIPRO) IVPB 400 mg  Status:  Discontinued        400 mg 200 mL/hr over 60 Minutes Intravenous Every 12 hours 01/20/24 1605 01/20/24 1615   01/20/24 1215  ciprofloxacin (CIPRO) IVPB 400 mg        400 mg 200 mL/hr over 60 Minutes Intravenous  Once  01/20/24 1204 01/20/24 1425       Procedures:   Consultants:

## 2024-01-21 NOTE — Assessment & Plan Note (Addendum)
01-21-2024 BMI 34.23 01-22-2024 stable 01-23-2024 stable 01-25-2024 stable

## 2024-01-21 NOTE — Progress Notes (Signed)
Daughter Grover Canavan called and updated per request.

## 2024-01-22 DIAGNOSIS — R3989 Other symptoms and signs involving the genitourinary system: Secondary | ICD-10-CM | POA: Diagnosis not present

## 2024-01-22 DIAGNOSIS — G9341 Metabolic encephalopathy: Secondary | ICD-10-CM | POA: Diagnosis not present

## 2024-01-22 DIAGNOSIS — N179 Acute kidney failure, unspecified: Secondary | ICD-10-CM | POA: Diagnosis not present

## 2024-01-22 DIAGNOSIS — R7881 Bacteremia: Secondary | ICD-10-CM

## 2024-01-22 LAB — CBC WITH DIFFERENTIAL/PLATELET
Abs Immature Granulocytes: 0.02 10*3/uL (ref 0.00–0.07)
Basophils Absolute: 0 10*3/uL (ref 0.0–0.1)
Basophils Relative: 1 %
Eosinophils Absolute: 0 10*3/uL (ref 0.0–0.5)
Eosinophils Relative: 0 %
HCT: 38.1 % (ref 36.0–46.0)
Hemoglobin: 13 g/dL (ref 12.0–15.0)
Immature Granulocytes: 1 %
Lymphocytes Relative: 30 %
Lymphs Abs: 1.3 10*3/uL (ref 0.7–4.0)
MCH: 30.4 pg (ref 26.0–34.0)
MCHC: 34.1 g/dL (ref 30.0–36.0)
MCV: 89 fL (ref 80.0–100.0)
Monocytes Absolute: 0.6 10*3/uL (ref 0.1–1.0)
Monocytes Relative: 14 %
Neutro Abs: 2.2 10*3/uL (ref 1.7–7.7)
Neutrophils Relative %: 54 %
Platelets: 205 10*3/uL (ref 150–400)
RBC: 4.28 MIL/uL (ref 3.87–5.11)
RDW: 13 % (ref 11.5–15.5)
WBC: 4.1 10*3/uL (ref 4.0–10.5)
nRBC: 0 % (ref 0.0–0.2)

## 2024-01-22 LAB — COMPREHENSIVE METABOLIC PANEL
ALT: 17 U/L (ref 0–44)
AST: 18 U/L (ref 15–41)
Albumin: 2.7 g/dL — ABNORMAL LOW (ref 3.5–5.0)
Alkaline Phosphatase: 64 U/L (ref 38–126)
Anion gap: 10 (ref 5–15)
BUN: 10 mg/dL (ref 8–23)
CO2: 24 mmol/L (ref 22–32)
Calcium: 8.3 mg/dL — ABNORMAL LOW (ref 8.9–10.3)
Chloride: 104 mmol/L (ref 98–111)
Creatinine, Ser: 0.64 mg/dL (ref 0.44–1.00)
GFR, Estimated: >60 mL/min (ref >60)
Glucose, Bld: 88 mg/dL (ref 70–99)
Potassium: 3.6 mmol/L (ref 3.5–5.1)
Sodium: 138 mmol/L (ref 135–145)
Total Bilirubin: 0.6 mg/dL (ref 0.0–1.2)
Total Protein: 5.5 g/dL — ABNORMAL LOW (ref 6.5–8.1)

## 2024-01-22 LAB — BLOOD GAS, VENOUS
Bicarbonate: 28 mmol/L (ref 20.0–28.0)
O2 Saturation: 39.4 mmol/L (ref 0.0–2.0)
Patient temperature: 37
Patient temperature: 39.4 %
pCO2, Ven: 61 mm[Hg] — ABNORMAL HIGH (ref 44–60)
pH, Ven: 7.27 (ref 7.25–7.43)
pO2, Ven: <31 mmol/L — CL (ref 32–45)

## 2024-01-22 MED ORDER — SODIUM CHLORIDE 1 G PO TABS
ORAL_TABLET | ORAL | Status: AC
  Administered 2024-01-22: 1 g via ORAL
  Filled 2024-01-22: qty 1

## 2024-01-22 MED ORDER — AMLODIPINE BESYLATE 10 MG PO TABS
10.0000 mg | ORAL_TABLET | Freq: Every day | ORAL | Status: DC
  Administered 2024-01-22 – 2024-01-26 (×5): 10 mg via ORAL
  Filled 2024-01-22: qty 2
  Filled 2024-01-22 (×2): qty 1

## 2024-01-22 MED ORDER — DIVALPROEX SODIUM 250 MG PO DR TAB
DELAYED_RELEASE_TABLET | ORAL | Status: AC
  Administered 2024-01-22: 250 mg via ORAL
  Filled 2024-01-22: qty 1

## 2024-01-22 MED ORDER — VENLAFAXINE HCL ER 150 MG PO CP24
ORAL_CAPSULE | ORAL | Status: AC
  Administered 2024-01-22: 150 mg via ORAL
  Filled 2024-01-22: qty 1

## 2024-01-22 MED ORDER — LEVOTHYROXINE SODIUM 25 MCG PO TABS
ORAL_TABLET | ORAL | Status: AC
  Administered 2024-01-22: 25 ug
  Filled 2024-01-22: qty 1

## 2024-01-22 MED ORDER — DIVALPROEX SODIUM 250 MG PO DR TAB
DELAYED_RELEASE_TABLET | ORAL | Status: AC
  Filled 2024-01-22: qty 1

## 2024-01-22 MED ORDER — OXYCODONE-ACETAMINOPHEN 5-325 MG PO TABS
ORAL_TABLET | ORAL | Status: AC
  Filled 2024-01-22: qty 1

## 2024-01-22 MED ORDER — LORAZEPAM 1 MG PO TABS
ORAL_TABLET | ORAL | Status: AC
  Administered 2024-01-22: 1 mg via ORAL
  Filled 2024-01-22: qty 1

## 2024-01-22 MED ORDER — LEVOTHYROXINE SODIUM 50 MCG PO TABS
25.0000 ug | ORAL_TABLET | Freq: Every day | ORAL | Status: DC
  Administered 2024-01-24 – 2024-01-26 (×3): 25 ug via ORAL
  Filled 2024-01-22 (×3): qty 1

## 2024-01-22 MED ORDER — AMLODIPINE BESYLATE 5 MG PO TABS
ORAL_TABLET | ORAL | Status: AC
  Filled 2024-01-22: qty 2

## 2024-01-22 MED ORDER — SULFAMETHOXAZOLE-TRIMETHOPRIM 800-160 MG PO TABS
ORAL_TABLET | ORAL | Status: AC
  Administered 2024-01-22: 1 via ORAL
  Filled 2024-01-22: qty 1

## 2024-01-22 MED ORDER — MELATONIN 5 MG PO TABS
ORAL_TABLET | ORAL | Status: AC
  Administered 2024-01-22: 10 mg via ORAL
  Filled 2024-01-22: qty 2

## 2024-01-22 MED ORDER — RISPERIDONE 1 MG PO TABS
ORAL_TABLET | ORAL | Status: AC
  Administered 2024-01-22: 2 mg via ORAL
  Filled 2024-01-22: qty 2

## 2024-01-22 NOTE — Progress Notes (Signed)
OT Cancellation Note  Patient Details Name: Jacqueline Williams MRN: [id number] DOB: 01/10/1951   Cancelled Treatment:    Reason Eval/Treat Not Completed: OT screened, no needs identified, will sign off. Order received, chart reviewed. Per chart pt is from LTC and hoyer at baseline. No skilled acute OT needs identified, will sign off.   Kathie Dike, M.S. OTR/L  01/22/24, 12:11 PM  ascom [phone]

## 2024-01-22 NOTE — Progress Notes (Signed)
PROGRESS NOTE    Jacqueline Williams  IOE:[id number] DOB: 1951-05-17 DOA: 01/20/2024 PCP: Melonie Florida, FNP  Subjective: Pt seen and examined. Treated with macrobid for 5 days. Last dose on 01-13-2024. I called [facility] of [place] to inquire about urine cultures. They are going to call back to results.  Pt feeling better. Scr has returned back to normal.  Pt does not recall her allergy to PCN but EPIC allergy lists anaphylaxis to PCN. Pt started on IV cipro.  Pt has been a resident of [facility] since 02-2022. She is a long term resident.  I think her staph species in blood cx is a contaminant.   Hospital Course: HPI: Jacqueline Williams is a 73 y.o. female with medical history significant of HTN, chronic pain syndrome, GERD, neuropathy, GAD, acquired scoliosis, obesity, history of neurogenic claudication due to lumbar spinal stenosis, severe depression, insomnia presenting with encephalopathy, AKI, UTI.  Limited history in the setting of encephalopathy.  Per report, patient sent from [facility] with concern for lethargy and mild confusion.  Some concern for?  CVA.  Also noted with recent UTI treatment though antibiotic regimen not specified.  No reported fevers or chills.  No reported chest pain or belly pain.  Mild back pain and?  Dysuria per patient.  No focal hemiparesis or confusion.   Significant Events: Admitted 01/20/2024 for AKI   Significant Labs: White count 6.5, hemoglobin 15.3, platelets 261, lactate 2.4. Troponin 30s to 20s. COVID flu and RSV negative. VBG stable. Creatinine 1.64.   Significant Imaging Studies: CT head shows No acute CT finding. Age related volume loss. Prominence of the lateral and third ventricles, minimally increased since the study of 2023. This may be due to central volume loss, but normal pressure hydrocephalus could be considered CXR shows No active cardiopulmonary disease   Antibiotic Therapy: Anti-infectives (From admission, onward)     Start     Dose/Rate Route Frequency Ordered Stop   01/21/24 1330  ciprofloxacin (CIPRO) IVPB 400 mg  Status:  Discontinued        400 mg 200 mL/hr over 60 Minutes Intravenous Every 24 hours 01/20/24 1615 01/20/24 1621   01/21/24 0200  ciprofloxacin (CIPRO) IVPB 400 mg        400 mg 200 mL/hr over 60 Minutes Intravenous Every 12 hours 01/20/24 1621     01/20/24 2300  ciprofloxacin (CIPRO) IVPB 400 mg  Status:  Discontinued        400 mg 200 mL/hr over 60 Minutes Intravenous Every 12 hours 01/20/24 1605 01/20/24 1615   01/20/24 1215  ciprofloxacin (CIPRO) IVPB 400 mg        400 mg 200 mL/hr over 60 Minutes Intravenous  Once 01/20/24 1204 01/20/24 1425       Procedures:   Consultants:     Assessment and Plan: * AKI (acute kidney injury) (HCC) On admission, Cr 1.6 today w/ GFR in the 30s . Baseline Cr 0.7. Suspect prerenal etiology. Will gently hydrate patient. Check FeNa. Hold nephrotoxic agents. Follow. 01-21-2024 AKI has resolved. Today, BUN 22, scr 0.89. stop IVF  01-22-2024 BUN 10, Scr 0.64 today.   Suspected UTI On admission, Noted worsening lethargy and confusion w/ ? Recent treatment for UTI. Pt does report ? Dysuria and back pain. UA pending. Will empirically cover w/ IV cipro pending urine studies. Otherwise monitor. De-escalate as appropriate.  01-21-2024 have placed call to [facility] SNF to inquire if any urine cx were sent last  week when she was treated for 5 days with macrobid. Will need to ask pharmacy about abx choices given listed "anaphylaxis" to PCN. Will need to investigate if pt has ever had cephalosporins before.  Urine cx E. Coli 01-06-2024 Resistant to ampicillin, cipro, levaquin Sensitive to tobra, zosyn, macrobid, bactrim, cefepime, rocephin, invanz, gent, cefazolin.  01-22-2024 after discussion with ID pharmacist. Started 3 days of Bactrim DS to treat E. Coli UTI. Monitor SCr  Gram-positive cocci bacteremia 01-22-2024 unclear if pt's GPC bacteremia is  actually a true infection vs contaminant of blood cx. Will repeat blood cx today. Hold off on starting IV vanco. Discussed with ID pharmacist. Pt is hemodynamically stable. No leukocytosis. No fevers. Culture Results: Lab Results  Component Value Date/Time   SDES  01/20/2024 09:02 PM    BLOOD LEFT WRIST Performed at [facility], [address], [place], [place] [address]    SDES  01/20/2024 10:54 AM    BLOOD RIGHT FOREARM Performed at [facility], [address] [place], [place] [address]    SPECREQUEST  01/20/2024 09:02 PM    BOTTLES DRAWN AEROBIC AND ANAEROBIC Blood Culture adequate volume   SPECREQUEST  01/20/2024 10:54 AM    BOTTLES DRAWN AEROBIC AND ANAEROBIC Blood Culture results may not be optimal due to an inadequate volume of blood received in culture bottles Performed at [facility], [address] [place], [place] [address]    SETUPTIME  01/20/2024 09:02 PM    GRAM POSITIVE COCCI IN BOTH AEROBIC AND ANAEROBIC BOTTLES CRITICAL VALUE NOTED.  VALUE IS CONSISTENT WITH PREVIOUSLY REPORTED AND CALLED VALUE. Performed at [facility], [address] [place], [place] [address]    SETUPTIME  01/20/2024 10:54 AM    Organism ID to follow GRAM POSITIVE COCCI IN BOTH AEROBIC AND ANAEROBIC BOTTLES CRITICAL RESULT CALLED TO, READ BACK BY AND VERIFIED WITH: JASON ROBBINS 01/21/24 0504 MW GRAM STAIN REVIEWED-AGREE WITH RESULT DRT Performed at [facility], [address] [place], [place] [address]    Elpidio Anis POSITIVE COCCI 01/20/2024 09:02 PM   CULT GRAM POSITIVE COCCI 01/20/2024 10:54 AM   REPTSTATUS PENDING 01/20/2024 09:02 PM   REPTSTATUS PENDING 01/20/2024 10:54 AM       Acute metabolic encephalopathy On admission, + generalized encephalopathy on presentation. Suspect likely multifactorial w/ contributions of AKI, UTI, chronic dementia. Unclear of baseline. CT head WNL. Monitor 01-21-2024 pt is awake and alert. She knows that she  has been living at [facility] for several years now. Knows that she is in the hospital.  01-22-2024 resolved. Pt is AxOx3 now   Hypokalemia 01-21-2024 replete with po kcl 40 meq q4h prn.  01-22-2024 resolved. K 3.6 today  Benign essential HTN On admission, BP stable. Titrate home regimen. 01-21-2024 stable.  01-22-2024 stable   Class 2 obesity 01-21-2024 BMI 34.23  01-22-2024 stable  Hypothyroidism On admission, Cont synthroid. 01-21-2024 continue synthroid 25 mg daily.  01-22-2024 stable    DVT prophylaxis:   Lovenox   Code Status: Full Code Family Communication: no family at bedside. Attempted to call contact of Susa Loffler @ [phone]. No answer Disposition Plan: SNF Reason for continuing need for hospitalization: remains for monitoring of blood cx  Objective: Vitals:   01/21/24 1920 01/21/24 2005 01/21/24 2023 01/22/24 0334  BP: (!) 148/71  (!) 143/59 139/61  Pulse: 77  76 76  Resp: 17     Temp:  98.3 F (36.8 C) 98.6 F (37 C)  99.5 F (37.5 C)  TempSrc:  Oral Oral Oral  SpO2: 97%  98% 97%  Weight:      Height:       No intake or output data in the 24 hours ending 01/22/24 1034 Filed Weights   01/20/24 0918  Weight: 96.2 kg    Examination:  Physical Exam Vitals and nursing note reviewed.  Constitutional:      Appearance: She is obese.     Comments: Chronically ill appearing  HENT:     Head: Normocephalic and atraumatic.     Nose: Nose normal.  Eyes:     General: No scleral icterus. Cardiovascular:     Rate and Rhythm: Normal rate and regular rhythm.  Pulmonary:     Effort: Pulmonary effort is normal.     Breath sounds: Normal breath sounds.  Abdominal:     General: Bowel sounds are normal. There is no distension.     Palpations: Abdomen is soft.     Tenderness: There is no abdominal tenderness.  Skin:    General: Skin is warm and dry.     Capillary Refill: Capillary refill takes less than 2 seconds.  Neurological:     Mental  Status: She is alert and oriented to person, place, and time.    Data Reviewed: I have personally reviewed following labs and imaging studies  CBC: Recent Labs  Lab 01/20/24 0903 01/21/24 0455 01/22/24 0702  WBC 6.5 6.1 4.1  NEUTROABS  --   --  2.2  HGB 15.3* 14.1 13.0  HCT 45.6 42.2 38.1  MCV 90.5 91.9 89.0  PLT 261 195 205   Basic Metabolic Panel: Recent Labs  Lab 01/20/24 0903 01/21/24 0455 01/22/24 0702  NA 138 137 138  K 3.1* 3.1* 3.6  CL 100 101 104  CO2 25 25 24   GLUCOSE 99 114* 88  BUN 30* 22 10  CREATININE 1.64* 0.89 0.64  CALCIUM 8.6* 8.4* 8.3*   GFR: Estimated Creatinine Clearance: 76.5 mL/min (by C-G formula based on SCr of 0.64 mg/dL). Liver Function Tests: Recent Labs  Lab 01/20/24 2956 01/21/24 0455 01/22/24 0702  AST 23 26 18   ALT 18 21 17   ALKPHOS 76 70 64  BILITOT 0.5 0.4 0.6  PROT 6.8 6.2* 5.5*  ALBUMIN 3.3* 2.9* 2.7*    Recent Labs  Lab 01/20/24 1020  AMMONIA 17   Coagulation Profile: Recent Labs  Lab 01/20/24 0903  INR 1.0   BNP (last 3 results) Recent Labs    01/20/24 0903  BNP 25.8   Sepsis Labs: Recent Labs  Lab 01/20/24 1124 01/21/24 2107  LATICACIDVEN 2.4* 2.1*    Recent Results (from the past 240 hours)  Blood culture (routine x 2)     Status: None (Preliminary result)   Collection Time: 01/20/24 10:54 AM   Specimen: BLOOD RIGHT FOREARM  Result Value Ref Range Status   Specimen Description   Final    BLOOD RIGHT FOREARM Performed at [facility], [address] [place], [place] [address]    Special Requests   Final    BOTTLES DRAWN AEROBIC AND ANAEROBIC Blood Culture results may not be optimal due to an inadequate volume of blood received in culture bottles Performed at [facility], [address] [place], [place] [address]    Culture  Setup Time   Final    Organism ID to follow GRAM POSITIVE COCCI IN BOTH AEROBIC AND ANAEROBIC BOTTLES CRITICAL RESULT CALLED TO, READ BACK BY AND  VERIFIED WITH: Princella Ion 01/21/24 0504 MW GRAM STAIN REVIEWED-AGREE WITH RESULT DRT Performed at [facility], [address] [place], [place] [address]    Culture GRAM POSITIVE COCCI  Final   Report Status PENDING  Incomplete  Blood Culture ID Panel (Reflexed)     Status: Abnormal   Collection Time: 01/20/24 10:54 AM  Result Value Ref Range Status   Enterococcus faecalis NOT DETECTED NOT DETECTED Final   Enterococcus Faecium NOT DETECTED NOT DETECTED Final   Listeria monocytogenes NOT DETECTED NOT DETECTED Final   Staphylococcus species DETECTED (A) NOT DETECTED Final    Comment: CRITICAL RESULT CALLED TO, READ BACK BY AND VERIFIED WITH: JASON ROBBINS 01/21/24 0504 MW    Staphylococcus aureus (BCID) NOT DETECTED NOT DETECTED Final   Staphylococcus epidermidis NOT DETECTED NOT DETECTED Final   Staphylococcus lugdunensis NOT DETECTED NOT DETECTED Final   Streptococcus species NOT DETECTED NOT DETECTED Final   Streptococcus agalactiae NOT DETECTED NOT DETECTED Final   Streptococcus pneumoniae NOT DETECTED NOT DETECTED Final   Streptococcus pyogenes NOT DETECTED NOT DETECTED Final   A.calcoaceticus-baumannii NOT DETECTED NOT DETECTED Final   Bacteroides fragilis NOT DETECTED NOT DETECTED Final   Enterobacterales NOT DETECTED NOT DETECTED Final   Enterobacter cloacae complex NOT DETECTED NOT DETECTED Final   Escherichia coli NOT DETECTED NOT DETECTED Final   Klebsiella aerogenes NOT DETECTED NOT DETECTED Final   Klebsiella oxytoca NOT DETECTED NOT DETECTED Final   Klebsiella pneumoniae NOT DETECTED NOT DETECTED Final   Proteus species NOT DETECTED NOT DETECTED Final   Salmonella species NOT DETECTED NOT DETECTED Final   Serratia marcescens NOT DETECTED NOT DETECTED Final   Haemophilus influenzae NOT DETECTED NOT DETECTED Final   Neisseria meningitidis NOT DETECTED NOT DETECTED Final   Pseudomonas aeruginosa NOT DETECTED NOT DETECTED Final   Stenotrophomonas maltophilia NOT  DETECTED NOT DETECTED Final   Candida albicans NOT DETECTED NOT DETECTED Final   Candida auris NOT DETECTED NOT DETECTED Final   Candida glabrata NOT DETECTED NOT DETECTED Final   Candida krusei NOT DETECTED NOT DETECTED Final   Candida parapsilosis NOT DETECTED NOT DETECTED Final   Candida tropicalis NOT DETECTED NOT DETECTED Final   Cryptococcus neoformans/gattii NOT DETECTED NOT DETECTED Final    Comment: Performed at [facility], [address] [place], [place] [address]  Resp panel by RT-PCR (RSV, Flu A&B, Covid) Anterior Nasal Swab     Status: None   Collection Time: 01/20/24 11:00 AM   Specimen: Anterior Nasal Swab  Result Value Ref Range Status   SARS Coronavirus 2 by RT PCR NEGATIVE NEGATIVE Final    Comment: (NOTE) SARS-CoV-2 target nucleic acids are NOT DETECTED.  The SARS-CoV-2 RNA is generally detectable in upper respiratory specimens during the acute phase of infection. The lowest concentration of SARS-CoV-2 viral copies this assay can detect is 138 copies/mL. A negative result does not preclude SARS-Cov-2 infection and should not be used as the sole basis for treatment or other patient management decisions. A negative result may occur with  improper specimen collection/handling, submission of specimen other than nasopharyngeal swab, presence of viral mutation(s) within the areas targeted by this assay, and inadequate number of viral copies(<138 copies/mL). A negative result must be combined with clinical observations, patient history, and epidemiological information. The expected result is Negative.  Fact Sheet for Patients:  BloggerCourse.com  Fact Sheet for Healthcare Providers:  SeriousBroker.it  This test is no t yet approved or cleared by the [place] FDA  and  has been authorized for detection and/or diagnosis of SARS-CoV-2 by FDA under an Emergency Use Authorization (EUA). This EUA will remain   in effect (meaning this test can be used) for the duration of the COVID-19 declaration under Section 564(b)(1) of the Act, 21 U.S.C.section 360bbb-3(b)(1), unless the authorization is terminated  or revoked sooner.       Influenza A by PCR NEGATIVE NEGATIVE Final   Influenza B by PCR NEGATIVE NEGATIVE Final    Comment: (NOTE) The Xpert Xpress SARS-CoV-2/FLU/RSV plus assay is intended as an aid in the diagnosis of influenza from Nasopharyngeal swab specimens and should not be used as a sole basis for treatment. Nasal washings and aspirates are unacceptable for Xpert Xpress SARS-CoV-2/FLU/RSV testing.  Fact Sheet for Patients: BloggerCourse.com  Fact Sheet for Healthcare Providers: SeriousBroker.it  This test is not yet approved or cleared by the [place] FDA and has been authorized for detection and/or diagnosis of SARS-CoV-2 by FDA under an Emergency Use Authorization (EUA). This EUA will remain in effect (meaning this test can be used) for the duration of the COVID-19 declaration under Section 564(b)(1) of the Act, 21 U.S.C. section 360bbb-3(b)(1), unless the authorization is terminated or revoked.     Resp Syncytial Virus by PCR NEGATIVE NEGATIVE Final    Comment: (NOTE) Fact Sheet for Patients: BloggerCourse.com  Fact Sheet for Healthcare Providers: SeriousBroker.it  This test is not yet approved or cleared by the [place] FDA and has been authorized for detection and/or diagnosis of SARS-CoV-2 by FDA under an Emergency Use Authorization (EUA). This EUA will remain in effect (meaning this test can be used) for the duration of the COVID-19 declaration under Section 564(b)(1) of the Act, 21 U.S.C. section 360bbb-3(b)(1), unless the authorization is terminated or revoked.  Performed at [facility], [address] [place], [place] [address]   Blood  culture (routine x 2)     Status: None (Preliminary result)   Collection Time: 01/20/24  9:02 PM   Specimen: BLOOD LEFT WRIST  Result Value Ref Range Status   Specimen Description   Final    BLOOD LEFT WRIST Performed at [facility], [address], [place], [place] [address]    Special Requests   Final    BOTTLES DRAWN AEROBIC AND ANAEROBIC Blood Culture adequate volume   Culture  Setup Time   Final    GRAM POSITIVE COCCI IN BOTH AEROBIC AND ANAEROBIC BOTTLES CRITICAL VALUE NOTED.  VALUE IS CONSISTENT WITH PREVIOUSLY REPORTED AND CALLED VALUE. Performed at [facility], [address], [place], [place] [address]    Culture [facility] POSITIVE COCCI  Final   Report Status PENDING  Incomplete     Radiology Studies: No results found.  Scheduled Meds:  divalproex       divalproex  250 mg Oral BID   enoxaparin (LOVENOX) injection  45 mg Subcutaneous Q24H   lipase/protease/amylase  48,000 Units Oral TID AC   melatonin  10 mg Oral QHS   oxyCODONE-acetaminophen       risperiDONE  2 mg Oral QHS   sodium chloride  1 g Oral BID   sulfamethoxazole-trimethoprim  1 tablet Oral Q12H   venlafaxine XR  150 mg Oral QHS   Continuous Infusions:   LOS: 2 days   Time spent: 40 minutes  Carollee Herter, DO  [facility]  01/22/2024, 10:34 AM

## 2024-01-22 NOTE — Evaluation (Signed)
Physical Therapy Evaluation Patient Details Name: Jacqueline Williams MRN: [id number] DOB: 1951-07-12 Today's Date: 01/22/2024  History of Present Illness  Pt is a 73 y.o. female presenting to hospital 01/20/24 with c/o AMS; initially concern for possible stroke.  Pt admitted with AKI, AMS, suspected UTI.  PMH includes htn, chronic pain syndrome, neuropathy, anxiety, depression, acquired scoliosis, h/o neurogenic claudication d/t lumbar spinal stenosis, insomnia, L toe amputation, gastric bypass.  Clinical Impression  Prior to recent medical concerns, pt reports using hoyer lift to transfer (although pt will sit on EOB at times with assist); lives at [facility] (LTC resident).  10/10 sacral pain reported (nurse notified of pt's pain and request for pain medication; pt repositioned in bed to improve comfort).  Currently pt is max assist semi-supine to sitting EOB; SBA sitting balance (pt tending to lean to L but able to correct with vc's); 2 assist to lay back down in bed; and mod assist for logrolling L/R in bed to change bed pad (d/t being wet--nurse notified).  Pt appearing anxious with mobility (pt requiring reassurance d/t concerns of falling).  Nurse updated on pt's status.  Pt appears close to functional baseline; no acute PT needs identified; will sign off.    If plan is discharge home, recommend the following: Two people to help with walking and/or transfers;A lot of help with bathing/dressing/bathroom;Assistance with cooking/housework;Assist for transportation;Help with stairs or ramp for entrance   Can travel by private vehicle    No    Equipment Recommendations Other (comment) (Pt reports having needed DME at facility already.)  Recommendations for Other Services       Functional Status Assessment Patient has not had a recent decline in their functional status     Precautions / Restrictions Precautions Precautions: Fall Restrictions Weight Bearing Restrictions Per Provider Order:  No      Mobility  Bed Mobility Overal bed mobility: Needs Assistance Bed Mobility: Rolling, Supine to Sit, Sit to Supine Rolling: Mod assist   Supine to sit: Max assist, HOB elevated Sit to supine: Mod assist, +2 for physical assistance, HOB elevated   General bed mobility comments: assist for trunk and B LE's; vc's for technique; 2 assist to boost pt up in bed using bed pad end of session    Transfers                   General transfer comment: Deferred (pt reports using hoyer lift)    Ambulation/Gait                  Stairs            Wheelchair Mobility     Tilt Bed    Modified Rankin (Stroke Patients Only)       Balance Overall balance assessment: Needs assistance Sitting-balance support: No upper extremity supported, Feet supported Sitting balance-Leahy Scale: Fair Sitting balance - Comments: steady static sitting; pt tending to lean towards L requiring intermittent vc's to sit upright Postural control: Left lateral lean                                   Pertinent Vitals/Pain Pain Assessment Pain Assessment: 0-10 Pain Score: 10-Worst pain ever Pain Location: sacral pain Pain Descriptors / Indicators: Aching Pain Intervention(s): Limited activity within patient's tolerance, Monitored during session, Repositioned, Patient requesting pain meds-RN notified    Home Living Family/patient expects to be discharged to::  Other (Comment)                   Additional Comments: [facility] LTC resident (since 02/2022 per chart; pt reports for past couple of years)    Prior Function Prior Level of Function : Needs assist       Physical Assist : Mobility (physical);ADLs (physical) Mobility (physical): Bed mobility;Transfers ADLs (physical): Toileting;IADLs;Dressing;Bathing;Grooming Mobility Comments: Pt reports requiring some assist with bed mobility and sitting on EOB (will sit on EOB at times); pt reports she uses  hoyer lift for all transfers ADLs Comments: Pt reports wearing diapers for toileting.  Requires staff assist for peri-care/ADL's.     Extremity/Trunk Assessment   Upper Extremity Assessment Upper Extremity Assessment: Generalized weakness    Lower Extremity Assessment Lower Extremity Assessment: RLE deficits/detail;LLE deficits/detail RLE Deficits / Details: R DF grossly 20 degrees short of neutral; hip flexion and knee flexion/extension at least 3-/5 AROM; DF at least 2/5 LLE Deficits / Details: L ankle positioned in PF (grossly 40-50 degrees); hip flexion and knee flexion/extension at least 3-/5 AROM; DF at least 2/5    Cervical / Trunk Assessment Cervical / Trunk Assessment: Other exceptions Cervical / Trunk Exceptions: forward head/shoulders  Communication   Communication Communication: No apparent difficulties Cueing Techniques: Verbal cues  Cognition Arousal: Alert Behavior During Therapy: Anxious Overall Cognitive Status: No family/caregiver present to determine baseline cognitive functioning                                 General Comments: Oriented to name, DOB, , and general situation; pt reported it was 2024        General Comments  Nursing cleared pt for participation in physical therapy.  Pt agreeable to PT session.    Exercises     Assessment/Plan    PT Assessment Patient does not need any further PT services  PT Problem List         PT Treatment Interventions      PT Goals (Current goals can be found in the Care Plan section)  Acute Rehab PT Goals Patient Stated Goal: to feel better PT Goal Formulation: With patient Time For Goal Achievement: 02/05/24 Potential to Achieve Goals: Good    Frequency       Co-evaluation               AM-PAC PT "6 Clicks" Mobility  Outcome Measure Help needed turning from your back to your side while in a flat bed without using bedrails?: A Lot Help needed moving from lying on  your back to sitting on the side of a flat bed without using bedrails?: A Lot Help needed moving to and from a bed to a chair (including a wheelchair)?: Total Help needed standing up from a chair using your arms (e.g., wheelchair or bedside chair)?: Total Help needed to walk in hospital room?: Total Help needed climbing 3-5 steps with a railing? : Total 6 Click Score: 8    End of Session   Activity Tolerance: Patient tolerated treatment well Patient left: in bed;with call bell/phone within reach;with bed alarm set;Other (comment) (B heels floating via pillow support; pt rolled towards L side (via pillow support)--pt reports this improved her sacral pain) Nurse Communication: Mobility status;Need for lift equipment;Precautions PT Visit Diagnosis: Muscle weakness (generalized) (M62.81)    Time: 1610-9604 PT Time Calculation (min) (ACUTE ONLY): 19 min   Charges:  PT Evaluation $PT Eval Low Complexity: 1 Low   PT General Charges $$ ACUTE PT VISIT: 1 Visit        Hendricks Limes, PT 01/22/24, 10:21 AM

## 2024-01-22 NOTE — Assessment & Plan Note (Addendum)
01-22-2024 unclear if pt's GPC bacteremia is actually a true infection vs contaminant of blood cx. Will repeat blood cx today. Hold off on starting IV vanco. Discussed with ID pharmacist. Pt is hemodynamically stable. No leukocytosis. No fevers. Culture Results: Lab Results  Component Value Date/Time   SDES  01/20/2024 09:02 PM    BLOOD LEFT WRIST Performed at [facility], [address] [place], [place] [address]    SDES  01/20/2024 10:54 AM    BLOOD RIGHT FOREARM Performed at [facility], [address], [place], [place] [address]    SPECREQUEST  01/20/2024 09:02 PM    BOTTLES DRAWN AEROBIC AND ANAEROBIC Blood Culture adequate volume   SPECREQUEST  01/20/2024 10:54 AM    BOTTLES DRAWN AEROBIC AND ANAEROBIC Blood Culture results may not be optimal due to an inadequate volume of blood received in culture bottles Performed at [facility], [address] [place], [place] [address]    SETUPTIME  01/20/2024 09:02 PM    GRAM POSITIVE COCCI IN BOTH AEROBIC AND ANAEROBIC BOTTLES CRITICAL VALUE NOTED.  VALUE IS CONSISTENT WITH PREVIOUSLY REPORTED AND CALLED VALUE. Performed at [facility], [address] [place], [place] [address]    SETUPTIME  01/20/2024 10:54 AM    Organism ID to follow GRAM POSITIVE COCCI IN BOTH AEROBIC AND ANAEROBIC BOTTLES CRITICAL RESULT CALLED TO, READ BACK BY AND VERIFIED WITH: JASON ROBBINS 01/21/24 0504 MW GRAM STAIN REVIEWED-AGREE WITH RESULT DRT Performed at [facility], [address] [place], [place] [address]    Elpidio Anis POSITIVE COCCI 01/20/2024 09:02 PM   CULT GRAM POSITIVE COCCI 01/20/2024 10:54 AM   REPTSTATUS PENDING 01/20/2024 09:02 PM   REPTSTATUS PENDING 01/20/2024 10:54 AM   01-23-2024 Blood cx grew staph epi and staph hominis from different bottles. I still think this is a blood cx contaminant.  Repeat blood cx pending. 01-24-2024 repeat blood negative. WBC 3.6 today. Afebrile. I still think her  initial blood cx were contaminated since they grew two different organisms.  Staph hominis and staph epi   01-25-2024 repeat blood cx are negative. I think staph epi and staph hominis were both blood culture contaminant.

## 2024-01-23 DIAGNOSIS — R7881 Bacteremia: Secondary | ICD-10-CM | POA: Diagnosis not present

## 2024-01-23 DIAGNOSIS — I10 Essential (primary) hypertension: Secondary | ICD-10-CM | POA: Diagnosis not present

## 2024-01-23 DIAGNOSIS — N179 Acute kidney failure, unspecified: Secondary | ICD-10-CM | POA: Diagnosis not present

## 2024-01-23 DIAGNOSIS — E039 Hypothyroidism, unspecified: Secondary | ICD-10-CM | POA: Diagnosis not present

## 2024-01-23 LAB — CULTURE, BLOOD (ROUTINE X 2)

## 2024-01-23 LAB — GLUCOSE, CAPILLARY: Glucose-Capillary: 105 mg/dL — ABNORMAL HIGH (ref 70–99)

## 2024-01-23 MED ORDER — PANCRELIPASE (LIP-PROT-AMYL) 12000-38000 UNITS PO CPEP
ORAL_CAPSULE | ORAL | Status: AC
  Filled 2024-01-23: qty 1

## 2024-01-23 MED ORDER — PANCRELIPASE (LIP-PROT-AMYL) 36000-114000 UNITS PO CPEP
ORAL_CAPSULE | ORAL | Status: AC
  Filled 2024-01-23: qty 1

## 2024-01-23 MED ORDER — DIVALPROEX SODIUM 250 MG PO DR TAB
DELAYED_RELEASE_TABLET | ORAL | Status: AC
  Filled 2024-01-23: qty 1

## 2024-01-23 MED ORDER — AMLODIPINE BESYLATE 5 MG PO TABS
ORAL_TABLET | ORAL | Status: AC
  Filled 2024-01-23: qty 2

## 2024-01-23 MED ORDER — LEVOTHYROXINE SODIUM 25 MCG PO TABS
ORAL_TABLET | ORAL | Status: AC
  Administered 2024-01-23: 25 ug via ORAL
  Filled 2024-01-23: qty 1

## 2024-01-23 MED ORDER — HYDROCODONE-ACETAMINOPHEN 5-325 MG PO TABS
ORAL_TABLET | ORAL | Status: AC
  Filled 2024-01-23: qty 1

## 2024-01-23 MED ORDER — OXYCODONE-ACETAMINOPHEN 5-325 MG PO TABS
ORAL_TABLET | ORAL | Status: AC
  Administered 2024-01-23: 1 via ORAL
  Filled 2024-01-23: qty 1

## 2024-01-23 MED ORDER — SULFAMETHOXAZOLE-TRIMETHOPRIM 800-160 MG PO TABS
ORAL_TABLET | ORAL | Status: AC
  Filled 2024-01-23: qty 1

## 2024-01-23 MED ORDER — SODIUM CHLORIDE 1 G PO TABS
ORAL_TABLET | ORAL | Status: AC
  Filled 2024-01-23: qty 1

## 2024-01-23 NOTE — TOC Initial Note (Addendum)
Transition of Care [facility]) - Initial/Assessment Note    Patient Details  Name: Jacqueline Williams MRN: [id number] Date of Birth: [birth year]  [facility] CM/SW Contact:    Liliana Cline, LCSW Phone Number: 01/23/2024, 1:38 PM  Clinical Narrative:                 Per Gavin Pound at [facility], patient is a resident there for long term care and can return when medically ready. Patient may be able to take over weekend - TOC to check with Gavin Pound if needed. Gavin Pound states she does not need a FL2. Will just need DC Summary and report called by RN when patient discharges.   Expected Discharge Plan: Skilled Nursing Facility Barriers to Discharge: Continued Medical Work up   Patient Goals and CMS Choice            Expected Discharge Plan and Services       Living arrangements for the past 2 months: Skilled Nursing Facility                                      Prior Living Arrangements/Services Living arrangements for the past 2 months: Skilled Nursing Facility Lives with:: Facility Resident Patient language and need for interpreter reviewed:: Yes        Need for Family Participation in Patient Care: Yes (Comment) Care giver support system in place?: Yes (comment)   Criminal Activity/Legal Involvement Pertinent to Current Situation/Hospitalization: No - Comment as needed  Activities of Daily Living   ADL Screening (condition at time of admission) Independently performs ADLs?: No Does the patient have a NEW difficulty with bathing/dressing/toileting/self-feeding that is expected to last >3 days?: No Does the patient have a NEW difficulty with getting in/out of bed, walking, or climbing stairs that is expected to last >3 days?: No Does the patient have a NEW difficulty with communication that is expected to last >3 days?: No Is the patient deaf or have difficulty hearing?: No Does the patient have difficulty seeing, even when wearing glasses/contacts?:  No Does the patient have difficulty concentrating, remembering, or making decisions?: Yes  Permission Sought/Granted                  Emotional Assessment         Alcohol / Substance Use: Not Applicable Psych Involvement: No (comment)  Admission diagnosis:  AKI (acute kidney injury) (HCC) [N17.9] Altered mental status, unspecified altered mental status type [R41.82] Patient Active Problem List   Diagnosis Date Noted   Gram-positive cocci bacteremia 01/22/2024   AKI (acute kidney injury) (HCC) 01/20/2024   Suspected UTI 01/20/2024   Thyroid nodule greater than or equal to 1 cm in diameter incidentally noted on imaging study    Leg swelling    Confusion    Acute metabolic encephalopathy 02/16/2022   Hypokalemia 01/29/2022   Hypothyroidism 01/29/2022   Class 2 obesity 01/29/2022   Benign essential HTN 01/29/2022   Anxiety and depression 01/29/2022   Generalized weakness 01/29/2022   Thyroid nodule 01/29/2022   Chronic osteomyelitis of toe, 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/11/2020   Anemia    History of recent blood transfusion 05/05/2017   PCP:  Melonie Florida, FNP Pharmacy:   [facility] [id number] [address], [place] - [address] [place] [address] Phone: [phone] Fax: [phone]     Social Drivers of Health (  SDOH) Social History: SDOH Screenings   Food Insecurity: Patient Unable To Answer (01/21/2024)  Housing: Patient Unable To Answer (01/21/2024)  Transportation Needs: Patient Unable To Answer (01/21/2024)  Utilities: Patient Unable To Answer (01/21/2024)  Social Connections: Patient Unable To Answer (01/21/2024)  Tobacco Use: Low Risk  (01/21/2024)   SDOH Interventions:     Readmission Risk Interventions     No data to display

## 2024-01-23 NOTE — Progress Notes (Signed)
PROGRESS NOTE    Jacqueline Williams  MWU:[id number] DOB: Jun 08, 1951 DOA: 01/20/2024 PCP: Melonie Florida, FNP  Subjective: Pt seen and examined.  Blood cx grew staph epi and staph hominis from different bottles.  Pt has chronic back pain but not any worse Repeat blood cx drawn.  Have held off starting systemic IV vanco as I still feel these are contaminants to blood cx.  Pt looking better.   Hospital Course: HPI: Jacqueline Williams is a 73 y.o. female with medical history significant of HTN, chronic pain syndrome, GERD, neuropathy, GAD, acquired scoliosis, obesity, history of neurogenic claudication due to lumbar spinal stenosis, severe depression, insomnia presenting with encephalopathy, AKI, UTI.  Limited history in the setting of encephalopathy.  Per report, patient sent from [facility] with concern for lethargy and mild confusion.  Some concern for?  CVA.  Also noted with recent UTI treatment though antibiotic regimen not specified.  No reported fevers or chills.  No reported chest pain or belly pain.  Mild back pain and?  Dysuria per patient.  No focal hemiparesis or confusion.   Significant Events: Admitted 01/20/2024 for AKI   Significant Labs: White count 6.5, hemoglobin 15.3, platelets 261, lactate 2.4. Troponin 30s to 20s. COVID flu and RSV negative. VBG stable. Creatinine 1.64.   Significant Imaging Studies: CT head shows No acute CT finding. Age related volume loss. Prominence of the lateral and third ventricles, minimally increased since the study of 2023. This may be due to central volume loss, but normal pressure hydrocephalus could be considered CXR shows No active cardiopulmonary disease   Antibiotic Therapy: Anti-infectives (From admission, onward)    Start     Dose/Rate Route Frequency Ordered Stop   01/21/24 1330  ciprofloxacin (CIPRO) IVPB 400 mg  Status:  Discontinued        400 mg 200 mL/hr over 60 Minutes Intravenous Every 24 hours 01/20/24 1615 01/20/24 1621    01/21/24 0200  ciprofloxacin (CIPRO) IVPB 400 mg        400 mg 200 mL/hr over 60 Minutes Intravenous Every 12 hours 01/20/24 1621     01/20/24 2300  ciprofloxacin (CIPRO) IVPB 400 mg  Status:  Discontinued        400 mg 200 mL/hr over 60 Minutes Intravenous Every 12 hours 01/20/24 1605 01/20/24 1615   01/20/24 1215  ciprofloxacin (CIPRO) IVPB 400 mg        400 mg 200 mL/hr over 60 Minutes Intravenous  Once 01/20/24 1204 01/20/24 1425       Procedures:   Consultants:     Assessment and Plan: * AKI (acute kidney injury) (HCC) On admission, Cr 1.6 today w/ GFR in the 30s . Baseline Cr 0.7. Suspect prerenal etiology. Will gently hydrate patient. Check FeNa. Hold nephrotoxic agents. Follow. 01-21-2024 AKI has resolved. Today, BUN 22, scr 0.89. stop IVF  01-22-2024 BUN 10, Scr 0.64 today.   Suspected UTI On admission, Noted worsening lethargy and confusion w/ ? Recent treatment for UTI. Pt does report ? Dysuria and back pain. UA pending. Will empirically cover w/ IV cipro pending urine studies. Otherwise monitor. De-escalate as appropriate. 01-21-2024 have placed call to [facility] SN[facility] to inquire if any urine cx were sent last week when she was treated for 5 days with macrobid. Will need to ask pharmacy about abx choices given listed "anaphylaxis" to PCN. Will need to investigate if pt has ever had cephalosporins before. Urine cx E. Coli 01-06-2024 Resistant to ampicillin, cipro, levaquin  Sensitive to tobra, zosyn, macrobid, bactrim, cefepime, rocephin, invanz, gent, cefazolin. 01-22-2024 after discussion with ID pharmacist. Started 3 days of Bactrim DS to treat E. Coli UTI. Monitor Scr  01-23-2024 continue with bactrim. Monitoring SCr  Gram-positive cocci bacteremia 01-22-2024 unclear if pt's GPC bacteremia is actually a true infection vs contaminant of blood cx. Will repeat blood cx today. Hold off on starting IV vanco. Discussed with ID pharmacist. Pt is hemodynamically stable.  No leukocytosis. No fevers. Culture Results: Lab Results  Component Value Date/Time   SDES  01/20/2024 09:02 PM    BLOOD LEFT WRIST Performed at [facility], [address], [place], [place] [address]    SDES  01/20/2024 10:54 AM    BLOOD RIGHT FOREARM Performed at [facility], [address], [place], [place] [address]    SPECREQUEST  01/20/2024 09:02 PM    BOTTLES DRAWN AEROBIC AND ANAEROBIC Blood Culture adequate volume   SPECREQUEST  01/20/2024 10:54 AM    BOTTLES DRAWN AEROBIC AND ANAEROBIC Blood Culture results may not be optimal due to an inadequate volume of blood received in culture bottles Performed at [facility], [address] [place], [place] [address]    SETUPTIME  01/20/2024 09:02 PM    GRAM POSITIVE COCCI IN BOTH AEROBIC AND ANAEROBIC BOTTLES CRITICAL VALUE NOTED.  VALUE IS CONSISTENT WITH PREVIOUSLY REPORTED AND CALLED VALUE. Performed at [facility], [address] [place], [place] [address]    SETUPTIME  01/20/2024 10:54 AM    Organism ID to follow GRAM POSITIVE COCCI IN BOTH AEROBIC AND ANAEROBIC BOTTLES CRITICAL RESULT CALLED TO, READ BACK BY AND VERIFIED WITH: JASON ROBBINS 01/21/24 0504 MW GRAM STAIN REVIEWED-AGREE WITH RESULT DRT Performed at [facility], [address] [place], [place] [address]    Elpidio Anis POSITIVE COCCI 01/20/2024 09:02 PM   CULT GRAM POSITIVE COCCI 01/20/2024 10:54 AM   REPTSTATUS PENDING 01/20/2024 09:02 PM   REPTSTATUS PENDING 01/20/2024 10:54 AM   01-23-2024 Blood cx grew staph epi and staph hominis from different bottles. I still think this is a blood cx contaminant.  Repeat blood cx pending.     Acute metabolic encephalopathy On admission, + generalized encephalopathy on presentation. Suspect likely multifactorial w/ contributions of AKI, UTI, chronic dementia. Unclear of baseline. CT head WNL. Monitor 01-21-2024 pt is awake and alert. She knows that she has been [facility] at  [facility] for several years now. Knows that she is in the hospital.  01-22-2024 resolved. Pt is AxOx3 now   Hypokalemia 01-21-2024 replete with po kcl 40 meq q4h prn.  01-22-2024 resolved. K 3.6 today  Benign essential HTN On admission, BP stable. Titrate home regimen. 01-21-2024 stable. 01-22-2024 stable  01-23-2024 stable. On norvasc 10 mg daily. Can restart ACEI if BP starts to rise but while on bactrim would be cautious.   Class 2 obesity 01-21-2024 BMI 34.23 01-22-2024 stable 01-23-2024 stable  Hypothyroidism On admission, Cont synthroid. 01-21-2024 continue synthroid 25 mg daily. 01-22-2024 stable  01-23-2024 stable        DVT prophylaxis:   Lovenox   Code Status: Full Code Family Communication: no family at bedside. Attempted to call pt's dtr crystal smith [phone]. No answer. Disposition Plan: return to SNF Reason for continuing need for hospitalization: monitoring blood cx.  Objective: Vitals:   01/22/24 1600 01/22/24 1930 01/23/24 0325 01/23/24 0837  BP: (!) 140/70 (!) 161/76 (!) 130/51 122/62  Pulse: 88 (!) 109 61 (!) 58  Resp: 20 17 15 16   Temp: 98.7 F (37.1 C) 99.7 F (37.6 C) 97.8 F (36.6 C) 98 F (36.7 C)  TempSrc: Oral Oral Oral Oral  SpO2: 100% 96% 94% 95%  Weight:      Height:       No intake or output data in the 24 hours ending 01/23/24 1133 Filed Weights   01/20/24 0918  Weight: 96.2 kg    Examination:  Physical Exam Vitals and nursing note reviewed.  Constitutional:      Comments: Looks better. Eating breakfast. I got her a hot cup of coffee this AM.  HENT:     Head: Normocephalic and atraumatic.     Nose: Nose normal.  Eyes:     General: No scleral icterus. Cardiovascular:     Rate and Rhythm: Normal rate and regular rhythm.  Pulmonary:     Effort: Pulmonary effort is normal.     Breath sounds: Normal breath sounds.  Abdominal:     General: Bowel sounds are normal.     Palpations: Abdomen is soft.   Musculoskeletal:     Right lower leg: No edema.     Left lower leg: No edema.  Skin:    General: Skin is warm and dry.  Neurological:     Mental Status: She is oriented to person, place, and time.     Data Reviewed: I have personally reviewed following labs and imaging studies  CBC: Recent Labs  Lab 01/20/24 0903 01/21/24 0455 01/22/24 0702  WBC 6.5 6.1 4.1  NEUTROABS  --   --  2.2  HGB 15.3* 14.1 13.0  HCT 45.6 42.2 38.1  MCV 90.5 91.9 89.0  PLT 261 195 205   Basic Metabolic Panel: Recent Labs  Lab 01/20/24 0903 01/21/24 0455 01/22/24 0702  NA 138 137 138  K 3.1* 3.1* 3.6  CL 100 101 104  CO2 25 25 24   GLUCOSE 99 114* 88  BUN 30* 22 10  CREATININE 1.64* 0.89 0.64  CALCIUM 8.6* 8.4* 8.3*   GFR: Estimated Creatinine Clearance: 76.5 mL/min (by C-G formula based on SCr of 0.64 mg/dL). Liver Function Tests: Recent Labs  Lab 01/20/24 6045 01/21/24 0455 01/22/24 0702  AST 23 26 18   ALT 18 21 17   ALKPHOS 76 70 64  BILITOT 0.5 0.4 0.6  PROT 6.8 6.2* 5.5*  ALBUMIN 3.3* 2.9* 2.7*    Recent Labs  Lab 01/20/24 1020  AMMONIA 17   Coagulation Profile: Recent Labs  Lab 01/20/24 0903  INR 1.0   BNP (last 3 results) Recent Labs    01/20/24 0903  BNP 25.8   Sepsis Labs: Recent Labs  Lab 01/20/24 1124 01/21/24 2107  LATICACIDVEN 2.4* 2.1*    Recent Results (from the past 240 hours)  Blood culture (routine x 2)     Status: Abnormal   Collection Time: 01/20/24 10:54 AM   Specimen: BLOOD RIGHT FOREARM  Result Value Ref Range Status   Specimen Description   Final    BLOOD RIGHT FOREARM Performed at [facility], [address] [place], [place] [address]    Special Requests   Final    BOTTLES DRAWN AEROBIC AND ANAEROBIC Blood Culture results may not be optimal due to an inadequate volume of blood received in culture bottles Performed at [facility], [address] [place], [place] [address]    Culture  Setup Time   Final     Organism ID to follow GRAM POSITIVE COCCI IN  BOTH AEROBIC AND ANAEROBIC BOTTLES CRITICAL RESULT CALLED TO, READ BACK BY AND VERIFIED WITH: JASON ROBBINS 01/21/24 0504 MW GRAM STAIN REVIEWED-AGREE WITH RESULT DRT Performed at [facility], [address] [place], [place] [address]    Culture (A)  Final    STAPHYLOCOCCUS EPIDERMIDIS THE SIGNIFICANCE OF ISOLATING THIS ORGANISM FROM A SINGLE SET OF BLOOD CULTURES WHEN MULTIPLE SETS ARE DRAWN IS UNCERTAIN. PLEASE NOTIFY THE MICROBIOLOGY DEPARTMENT WITHIN ONE WEEK IF SPECIATION AND SENSITIVITIES ARE REQUIRED. STAPHYLOCOCCUS HOMINIS    Report Status 01/23/2024 FINAL  Final   Organism ID, Bacteria STAPHYLOCOCCUS HOMINIS  Final      Susceptibility   Staphylococcus hominis - MIC*    CIPROFLOXACIN <=0.5 SENSITIVE Sensitive     ERYTHROMYCIN <=0.25 SENSITIVE Sensitive     GENTAMICIN <=0.5 SENSITIVE Sensitive     OXACILLIN <=0.25 SENSITIVE Sensitive     TETRACYCLINE 2 SENSITIVE Sensitive     VANCOMYCIN <=0.5 SENSITIVE Sensitive     TRIMETH/SULFA <=10 SENSITIVE Sensitive     CLINDAMYCIN <=0.25 SENSITIVE Sensitive     RIFAMPIN <=0.5 SENSITIVE Sensitive     Inducible Clindamycin NEGATIVE Sensitive     * STAPHYLOCOCCUS HOMINIS  Blood Culture ID Panel (Reflexed)     Status: Abnormal   Collection Time: 01/20/24 10:54 AM  Result Value Ref Range Status   Enterococcus faecalis NOT DETECTED NOT DETECTED Final   Enterococcus Faecium NOT DETECTED NOT DETECTED Final   Listeria monocytogenes NOT DETECTED NOT DETECTED Final   Staphylococcus species DETECTED (A) NOT DETECTED Final    Comment: CRITICAL RESULT CALLED TO, READ BACK BY AND VERIFIED WITH: JASON ROBBINS 01/21/24 0504 MW    Staphylococcus aureus (BCID) NOT DETECTED NOT DETECTED Final   Staphylococcus epidermidis NOT DETECTED NOT DETECTED Final   Staphylococcus lugdunensis NOT DETECTED NOT DETECTED Final   Streptococcus species NOT DETECTED NOT DETECTED Final   Streptococcus agalactiae NOT  DETECTED NOT DETECTED Final   Streptococcus pneumoniae NOT DETECTED NOT DETECTED Final   Streptococcus pyogenes NOT DETECTED NOT DETECTED Final   A.calcoaceticus-baumannii NOT DETECTED NOT DETECTED Final   Bacteroides fragilis NOT DETECTED NOT DETECTED Final   Enterobacterales NOT DETECTED NOT DETECTED Final   Enterobacter cloacae complex NOT DETECTED NOT DETECTED Final   Escherichia coli NOT DETECTED NOT DETECTED Final   Klebsiella aerogenes NOT DETECTED NOT DETECTED Final   Klebsiella oxytoca NOT DETECTED NOT DETECTED Final   Klebsiella pneumoniae NOT DETECTED NOT DETECTED Final   Proteus species NOT DETECTED NOT DETECTED Final   Salmonella species NOT DETECTED NOT DETECTED Final   Serratia marcescens NOT DETECTED NOT DETECTED Final   Haemophilus influenzae NOT DETECTED NOT DETECTED Final   Neisseria meningitidis NOT DETECTED NOT DETECTED Final   Pseudomonas aeruginosa NOT DETECTED NOT DETECTED Final   Stenotrophomonas maltophilia NOT DETECTED NOT DETECTED Final   Candida albicans NOT DETECTED NOT DETECTED Final   Candida auris NOT DETECTED NOT DETECTED Final   Candida glabrata NOT DETECTED NOT DETECTED Final   Candida krusei NOT DETECTED NOT DETECTED Final   Candida parapsilosis NOT DETECTED NOT DETECTED Final   Candida tropicalis NOT DETECTED NOT DETECTED Final   Cryptococcus neoformans/gattii NOT DETECTED NOT DETECTED Final    Comment: Performed at [facility], [address] [place], [place] [address]  Resp panel by RT-PCR (RSV, Flu A&B, Covid) Anterior Nasal Swab     Status: None   Collection Time: 01/20/24 11:00 AM   Specimen: Anterior Nasal Swab  Result Value Ref Range Status  SARS Coronavirus 2 by RT PCR NEGATIVE NEGATIVE Final    Comment: (NOTE) SARS-CoV-2 target nucleic acids are NOT DETECTED.  The SARS-CoV-2 RNA is generally detectable in upper respiratory specimens during the acute phase of infection. The lowest concentration of SARS-CoV-2 viral copies  this assay can detect is 138 copies/mL. A negative result does not preclude SARS-Cov-2 infection and should not be used as the sole basis for treatment or other patient management decisions. A negative result may occur with  improper specimen collection/handling, submission of specimen other than nasopharyngeal swab, presence of viral mutation(s) within the areas targeted by this assay, and inadequate number of viral copies(<138 copies/mL). A negative result must be combined with clinical observations, patient history, and epidemiological information. The expected result is Negative.  Fact Sheet for Patients:  BloggerCourse.com  Fact Sheet for Healthcare Providers:  SeriousBroker.it  This test is no t yet approved or cleared by the [place] FDA and  has been authorized for detection and/or diagnosis of SARS-CoV-2 by FDA under an Emergency Use Authorization (EUA). This EUA will remain  in effect (meaning this test can be used) for the duration of the COVID-19 declaration under Section 564(b)(1) of the Act, 21 U.S.C.section 360bbb-3(b)(1), unless the authorization is terminated  or revoked sooner.       Influenza A by PCR NEGATIVE NEGATIVE Final   Influenza B by PCR NEGATIVE NEGATIVE Final    Comment: (NOTE) The Xpert Xpress SARS-CoV-2/FLU/RSV plus assay is intended as an aid in the diagnosis of influenza from Nasopharyngeal swab specimens and should not be used as a sole basis for treatment. Nasal washings and aspirates are unacceptable for Xpert Xpress SARS-CoV-2/FLU/RSV testing.  Fact Sheet for Patients: BloggerCourse.com  Fact Sheet for Healthcare Providers: SeriousBroker.it  This test is not yet approved or cleared by the [place] FDA and has been authorized for detection and/or diagnosis of SARS-CoV-2 by FDA under an Emergency Use Authorization (EUA). This EUA  will remain in effect (meaning this test can be used) for the duration of the COVID-19 declaration under Section 564(b)(1) of the Act, 21 U.S.C. section 360bbb-3(b)(1), unless the authorization is terminated or revoked.     Resp Syncytial Virus by PCR NEGATIVE NEGATIVE Final    Comment: (NOTE) Fact Sheet for Patients: BloggerCourse.com  Fact Sheet for Healthcare Providers: SeriousBroker.it  This test is not yet approved or cleared by the [place] FDA and has been authorized for detection and/or diagnosis of SARS-CoV-2 by FDA under an Emergency Use Authorization (EUA). This EUA will remain in effect (meaning this test can be used) for the duration of the COVID-19 declaration under Section 564(b)(1) of the Act, 21 U.S.C. section 360bbb-3(b)(1), unless the authorization is terminated or revoked.  Performed at [facility], [address] [place], [place] [address]   Blood culture (routine x 2)     Status: Abnormal (Preliminary result)   Collection Time: 01/20/24  9:02 PM   Specimen: BLOOD LEFT WRIST  Result Value Ref Range Status   Specimen Description   Final    BLOOD LEFT WRIST Performed at [facility], [address] [place], [place] [address]    Special Requests   Final    BOTTLES DRAWN AEROBIC AND ANAEROBIC Blood Culture adequate volume Performed at [facility], [address] [place], [place] [address]    Culture  Setup Time   Final    GRAM POSITIVE COCCI IN BOTH AEROBIC AND ANAEROBIC BOTTLES CRITICAL VALUE NOTED.  VALUE IS  CONSISTENT WITH PREVIOUSLY REPORTED AND CALLED VALUE. Performed at [facility], [address], [place], [place] [address]    Culture (A)  Final    STAPHYLOCOCCUS HOMINIS SUSCEPTIBILITIES TO FOLLOW Performed at [facility], [address], [place], [place] [address]    Report Status PENDING  Incomplete  Culture, blood (Routine X 2) w Reflex to  ID Panel     Status: None (Preliminary result)   Collection Time: 01/22/24 10:09 AM   Specimen: BLOOD  Result Value Ref Range Status   Specimen Description BLOOD BLOOD LEFT HAND  Final   Special Requests   Final    BOTTLES DRAWN AEROBIC AND ANAEROBIC Blood Culture results may not be optimal due to an inadequate volume of blood received in culture bottles   Culture   Final    NO GROWTH < 24 HOURS Performed at [facility], [address], [place], [place] [address]    Report Status PENDING  Incomplete  Culture, blood (Routine X 2) w Reflex to ID Panel     Status: None (Preliminary result)   Collection Time: 01/22/24 10:16 AM   Specimen: BLOOD  Result Value Ref Range Status   Specimen Description BLOOD BLOOD RIGHT HAND  Final   Special Requests   Final    BOTTLES DRAWN AEROBIC AND ANAEROBIC Blood Culture results may not be optimal due to an inadequate volume of blood received in culture bottles   Culture   Final    NO GROWTH < 24 HOURS Performed at [facility], [address], [place], [place] [address]    Report Status PENDING  Incomplete     Radiology Studies: No results found.  Scheduled Meds:  amLODipine  10 mg Oral Daily   divalproex  250 mg Oral BID   enoxaparin (LOVENOX) injection  45 mg Subcutaneous Q24H   levothyroxine  25 mcg Oral Q0600   lipase/protease/amylase  48,000 Units Oral TID AC   melatonin  10 mg Oral QHS   risperiDONE  2 mg Oral QHS   sodium chloride  1 g Oral BID   sulfamethoxazole-trimethoprim  1 tablet Oral Q12H   venlafaxine XR  150 mg Oral QHS   Continuous Infusions:   LOS: 3 days   Time spent: 40 minutes  Carollee Herter, DO  [facility]  01/23/2024, 11:33 AM

## 2024-01-24 ENCOUNTER — Encounter: Payer: Self-pay | Admitting: Family Medicine

## 2024-01-24 DIAGNOSIS — G9341 Metabolic encephalopathy: Secondary | ICD-10-CM | POA: Diagnosis not present

## 2024-01-24 DIAGNOSIS — N179 Acute kidney failure, unspecified: Secondary | ICD-10-CM | POA: Diagnosis not present

## 2024-01-24 DIAGNOSIS — R3989 Other symptoms and signs involving the genitourinary system: Secondary | ICD-10-CM | POA: Diagnosis not present

## 2024-01-24 DIAGNOSIS — R7881 Bacteremia: Secondary | ICD-10-CM | POA: Diagnosis not present

## 2024-01-24 LAB — COMPREHENSIVE METABOLIC PANEL
ALT: 29 U/L (ref 0–44)
AST: 33 U/L (ref 15–41)
Albumin: 2.7 g/dL — ABNORMAL LOW (ref 3.5–5.0)
Alkaline Phosphatase: 62 U/L (ref 38–126)
Anion gap: 11 (ref 5–15)
BUN: 8 mg/dL (ref 8–23)
CO2: 24 mmol/L (ref 22–32)
Calcium: 8.5 mg/dL — ABNORMAL LOW (ref 8.9–10.3)
Chloride: 102 mmol/L (ref 98–111)
Creatinine, Ser: 0.64 mg/dL (ref 0.44–1.00)
GFR, Estimated: >60 mL/min (ref >60)
Glucose, Bld: 78 mg/dL (ref 70–99)
Potassium: 3.5 mmol/L (ref 3.5–5.1)
Sodium: 137 mmol/L (ref 135–145)
Total Bilirubin: 0.6 mg/dL (ref 0.0–1.2)
Total Protein: 5.7 g/dL — ABNORMAL LOW (ref 6.5–8.1)

## 2024-01-24 LAB — CBC WITH DIFFERENTIAL/PLATELET
Abs Immature Granulocytes: 0.02 10*3/uL (ref 0.00–0.07)
Basophils Absolute: 0 10*3/uL (ref 0.0–0.1)
Basophils Relative: 1 %
Eosinophils Absolute: 0 10*3/uL (ref 0.0–0.5)
Eosinophils Relative: 0 %
HCT: 38.7 % (ref 36.0–46.0)
Hemoglobin: 12.9 g/dL (ref 12.0–15.0)
Immature Granulocytes: 1 %
Lymphocytes Relative: 42 %
Lymphs Abs: 1.5 10*3/uL (ref 0.7–4.0)
MCH: 30.4 pg (ref 26.0–34.0)
MCHC: 33.3 g/dL (ref 30.0–36.0)
MCV: 91.1 fL (ref 80.0–100.0)
Monocytes Absolute: 0.5 10*3/uL (ref 0.1–1.0)
Monocytes Relative: 15 %
Neutro Abs: 1.5 10*3/uL — ABNORMAL LOW (ref 1.7–7.7)
Neutrophils Relative %: 41 %
Platelets: 212 10*3/uL (ref 150–400)
RBC: 4.25 MIL/uL (ref 3.87–5.11)
RDW: 12.9 % (ref 11.5–15.5)
WBC: 3.6 10*3/uL — ABNORMAL LOW (ref 4.0–10.5)
nRBC: 0 % (ref 0.0–0.2)

## 2024-01-24 NOTE — Progress Notes (Signed)
PROGRESS NOTE    Jacqueline Williams  ZHY:[id number] DOB: June 04, 1951 DOA: 01/20/2024 PCP: Melonie Florida, FNP  Subjective: Pt seen and examined. Pt stable. Same as yesterday. Able to drink 2 glasses of water for me today.  Repeat blood cx are negative thus far. Remains on Bactrim for E. Coli uti. Afebrile.   Hospital Course: HPI: Jacqueline Williams is a 73 y.o. female with medical history significant of HTN, chronic pain syndrome, GERD, neuropathy, GAD, acquired scoliosis, obesity, history of neurogenic claudication due to lumbar spinal stenosis, severe depression, insomnia presenting with encephalopathy, AKI, UTI.  Limited history in the setting of encephalopathy.  Per report, patient sent from [facility] with concern for lethargy and mild confusion.  Some concern for?  CVA.  Also noted with recent UTI treatment though antibiotic regimen not specified.  No reported fevers or chills.  No reported chest pain or belly pain.  Mild back pain and?  Dysuria per patient.  No focal hemiparesis or confusion.   Significant Events: Admitted 01/20/2024 for AKI   Significant Labs: White count 6.5, hemoglobin 15.3, platelets 261, lactate 2.4. Troponin 30s to 20s. COVID flu and RSV negative. VBG stable. Creatinine 1.64.   Significant Imaging Studies: CT head shows No acute CT finding. Age related volume loss. Prominence of the lateral and third ventricles, minimally increased since the study of 2023. This may be due to central volume loss, but normal pressure hydrocephalus could be considered CXR shows No active cardiopulmonary disease   Antibiotic Therapy: Anti-infectives (From admission, onward)    Start     Dose/Rate Route Frequency Ordered Stop   01/21/24 1330  ciprofloxacin (CIPRO) IVPB 400 mg  Status:  Discontinued        400 mg 200 mL/hr over 60 Minutes Intravenous Every 24 hours 01/20/24 1615 01/20/24 1621   01/21/24 0200  ciprofloxacin (CIPRO) IVPB 400 mg        400 mg 200 mL/hr over 60  Minutes Intravenous Every 12 hours 01/20/24 1621     01/20/24 2300  ciprofloxacin (CIPRO) IVPB 400 mg  Status:  Discontinued        400 mg 200 mL/hr over 60 Minutes Intravenous Every 12 hours 01/20/24 1605 01/20/24 1615   01/20/24 1215  ciprofloxacin (CIPRO) IVPB 400 mg        400 mg 200 mL/hr over 60 Minutes Intravenous  Once 01/20/24 1204 01/20/24 1425       Procedures:   Consultants:    Assessment and Plan: * AKI (acute kidney injury) (HCC) On admission, Cr 1.6 today w/ GFR in the 30s . Baseline Cr 0.7. Suspect prerenal etiology. Will gently hydrate patient. Check FeNa. Hold nephrotoxic agents. Follow. 01-21-2024 AKI has resolved. Today, BUN 22, scr 0.89. stop IVF 01-22-2024 BUN 10, Scr 0.64 today.  01-24-2024 stable. Scr 0.64, BUN 8. Tolerating bactrim.   Suspected UTI On admission, Noted worsening lethargy and confusion w/ ? Recent treatment for UTI. Pt does report ? Dysuria and back pain. UA pending. Will empirically cover w/ IV cipro pending urine studies. Otherwise monitor. De-escalate as appropriate. 01-21-2024 have placed call to [facility] to inquire if any urine cx were sent last week when she was treated for 5 days with macrobid. Will need to ask pharmacy about abx choices given listed "anaphylaxis" to PCN. Will need to investigate if pt has ever had cephalosporins before. Urine cx E. Coli 01-06-2024 Resistant to ampicillin, cipro, levaquin Sensitive to tobra, zosyn, macrobid, bactrim, cefepime, rocephin, invanz, gent,  cefazolin. 01-22-2024 after discussion with ID pharmacist. Started 3 days of Bactrim DS to treat E. Coli UTI. Monitor Scr 01-23-2024 continue with bactrim. Monitoring Scr  01-24-2024 Scr 0.64, BUN 8. Tolerating bactrim.   Gram-positive cocci bacteremia 01-22-2024 unclear if pt's GPC bacteremia is actually a true infection vs contaminant of blood cx. Will repeat blood cx today. Hold off on starting IV vanco. Discussed with ID pharmacist. Pt is  hemodynamically stable. No leukocytosis. No fevers. Culture Results: Lab Results  Component Value Date/Time   SDES  01/20/2024 09:02 PM    BLOOD LEFT WRIST Performed at [facility], [address], [place], [place] [address]    SDES  01/20/2024 10:54 AM    BLOOD RIGHT FOREARM Performed at [facility], [address], [place], [place] [address]    SPECREQUEST  01/20/2024 09:02 PM    BOTTLES DRAWN AEROBIC AND ANAEROBIC Blood Culture adequate volume   SPECREQUEST  01/20/2024 10:54 AM    BOTTLES DRAWN AEROBIC AND ANAEROBIC Blood Culture results may not be optimal due to an inadequate volume of blood received in culture bottles Performed at [facility], [address] [place], [place] [address]    SETUPTIME  01/20/2024 09:02 PM    GRAM POSITIVE COCCI IN BOTH AEROBIC AND ANAEROBIC BOTTLES CRITICAL VALUE NOTED.  VALUE IS CONSISTENT WITH PREVIOUSLY REPORTED AND CALLED VALUE. Performed at [facility], [address] [place], [place] [address]    SETUPTIME  01/20/2024 10:54 AM    Organism ID to follow GRAM POSITIVE COCCI IN BOTH AEROBIC AND ANAEROBIC BOTTLES CRITICAL RESULT CALLED TO, READ BACK BY AND VERIFIED WITH: JASON ROBBINS 01/21/24 0504 MW GRAM STAIN REVIEWED-AGREE WITH RESULT DRT Performed at [facility], [address], [place], [place] [address]    Elpidio Anis POSITIVE COCCI 01/20/2024 09:02 PM   CULT GRAM POSITIVE COCCI 01/20/2024 10:54 AM   REPTSTATUS PENDING 01/20/2024 09:02 PM   REPTSTATUS PENDING 01/20/2024 10:54 AM   01-23-2024 Blood cx grew staph epi and staph hominis from different bottles. I still think this is a blood cx contaminant.  Repeat blood cx pending.  01-24-2024 repeat blood negative. WBC 3.6 today. Afebrile. I still think her initial blood cx were contaminated since they grew two different organisms.  Staph hominis and staph epi   Acute metabolic encephalopathy On admission, + generalized encephalopathy on  presentation. Suspect likely multifactorial w/ contributions of AKI, UTI, chronic dementia. Unclear of baseline. CT head WNL. Monitor 01-21-2024 pt is awake and alert. She knows that she has been [facility] at [facility] for several years now. Knows that she is in the hospital.  01-22-2024 resolved. Pt is AxOx3 now   Hypokalemia 01-21-2024 replete with po kcl 40 meq q4h prn.  01-22-2024 resolved. K 3.6 today  Benign essential HTN On admission, BP stable. Titrate home regimen. 01-21-2024 stable. 01-22-2024 stable 01-23-2024 stable. On norvasc 10 mg daily. Can restart ACEI if BP starts to rise but while on bactrim would be cautious.  01-24-2024 stable. On norvasc 10 mg daily.   Class 2 obesity 01-21-2024 BMI 34.23 01-22-2024 stable 01-23-2024 stable  Hypothyroidism On admission, Cont synthroid. 01-21-2024 continue synthroid 25 mg daily. 01-22-2024 stable 01-23-2024 stable  01-24-2024 stable   DVT prophylaxis:   Lovenox   Code Status: Full Code Family Communication: no family at bedside. called dtr Jacqueline Williams and gave her update. Disposition Plan: SNF Reason for continuing need for hospitalization: medically stable  Objective: Vitals:   01/23/24  1600 01/23/24 1950 01/24/24 0453 01/24/24 0855  BP: (P) 133/60 135/63 126/62 122/70  Pulse: (P) 80 79 73 76  Resp: (P) 15 18 16 18   Temp:  98.7 F (37.1 C) 98.3 F (36.8 C) 98.7 F (37.1 C)  TempSrc:  Oral Oral Oral  SpO2: (P) 95% 95% 94% 96%  Weight:      Height:       No intake or output data in the 24 hours ending 01/24/24 1247 Filed Weights   01/20/24 0918  Weight: 96.2 kg   Examination:  Physical Exam Vitals and nursing note reviewed.  Constitutional:      Comments: Chronically ill  HENT:     Head: Normocephalic and atraumatic.     Nose: Nose normal.  Eyes:     General: No scleral icterus. Cardiovascular:     Rate and Rhythm: Normal rate and regular rhythm.  Pulmonary:     Effort: Pulmonary effort is  normal.     Breath sounds: Normal breath sounds.  Abdominal:     General: Bowel sounds are normal. There is no distension.     Palpations: Abdomen is soft.  Skin:    General: Skin is warm and dry.     Capillary Refill: Capillary refill takes less than 2 seconds.     Comments: Tattoo of [place]inker[place] on  right lower leg  Neurological:     General: No focal deficit present.     Mental Status: She is alert and oriented to person, place, and time.    Data Reviewed: I have personally reviewed following labs and imaging studies  CBC: Recent Labs  Lab 01/20/24 0903 01/21/24 0455 01/22/24 0702 01/24/24 0606  WBC 6.5 6.1 4.1 3.6*  NEUTROABS  --   --  2.2 1.5*  HGB 15.3* 14.1 13.0 12.9  HCT 45.6 42.2 38.1 38.7  MCV 90.5 91.9 89.0 91.1  PLT 261 195 205 212   Basic Metabolic Panel: Recent Labs  Lab 01/20/24 0903 01/21/24 0455 01/22/24 0702 01/24/24 0606  NA 138 137 138 137  K 3.1* 3.1* 3.6 3.5  CL 100 101 104 102  CO2 25 25 24 24   GLUCOSE 99 114* 88 78  BUN 30* 22 10 8   CREATININE 1.64* 0.89 0.64 0.64  CALCIUM 8.6* 8.4* 8.3* 8.5*   GFR: Estimated Creatinine Clearance: 76.5 mL/min (by C-G formula based on SCr of 0.64 mg/dL). Liver Function Tests: Recent Labs  Lab 01/20/24 9147 01/21/24 0455 01/22/24 0702 01/24/24 0606  AST 23 26 18  33  ALT 18 21 17 29   ALKPHOS 76 70 64 62  BILITOT 0.5 0.4 0.6 0.6  PROT 6.8 6.2* 5.5* 5.7*  ALBUMIN 3.3* 2.9* 2.7* 2.7*    Recent Labs  Lab 01/20/24 1020  AMMONIA 17   Coagulation Profile: Recent Labs  Lab 01/20/24 0903  INR 1.0   BNP (last 3 results) Recent Labs    01/20/24 0903  BNP 25.8   CBG: Recent Labs  Lab 01/23/24 1209  GLUCAP 105*   Sepsis Labs: Recent Labs  Lab 01/20/24 1124 01/21/24 2107  LATICACIDVEN 2.4* 2.1*    Recent Results (from the past 240 hours)  Blood culture (routine x 2)     Status: Abnormal   Collection Time: 01/20/24 10:54 AM   Specimen: BLOOD RIGHT FOREARM  Result Value Ref Range  Status   Specimen Description   Final    BLOOD RIGHT FOREARM Performed at [facility], [address] [place], [place] [address]  Special Requests   Final    BOTTLES DRAWN AEROBIC AND ANAEROBIC Blood Culture results may not be optimal due to an inadequate volume of blood received in culture bottles Performed at [facility], [address] [place], [place] [address]    Culture  Setup Time   Final    Organism ID to follow GRAM POSITIVE COCCI IN BOTH AEROBIC AND ANAEROBIC BOTTLES CRITICAL RESULT CALLED TO, READ BACK BY AND VERIFIED WITH: JASON ROBBINS 01/21/24 0504 MW GRAM STAIN REVIEWED-AGREE WITH RESULT DRT Performed at [facility], [address] [place], [place] [address]    Culture (A)  Final    STAPHYLOCOCCUS EPIDERMIDIS THE SIGNIFICANCE OF ISOLATING THIS ORGANISM FROM A SINGLE SET OF BLOOD CULTURES WHEN MULTIPLE SETS ARE DRAWN IS UNCERTAIN. PLEASE NOTIFY THE MICROBIOLOGY DEPARTMENT WITHIN ONE WEEK IF SPECIATION AND SENSITIVITIES ARE REQUIRED. STAPHYLOCOCCUS HOMINIS    Report Status 01/23/2024 FINAL  Final   Organism ID, Bacteria STAPHYLOCOCCUS HOMINIS  Final      Susceptibility   Staphylococcus hominis - MIC*    CIPROFLOXACIN <=0.5 SENSITIVE Sensitive     ERYTHROMYCIN <=0.25 SENSITIVE Sensitive     GENTAMICIN <=0.5 SENSITIVE Sensitive     OXACILLIN <=0.25 SENSITIVE Sensitive     TETRACYCLINE 2 SENSITIVE Sensitive     VANCOMYCIN <=0.5 SENSITIVE Sensitive     TRIMETH/SULFA <=10 SENSITIVE Sensitive     CLINDAMYCIN <=0.25 SENSITIVE Sensitive     RIFAMPIN <=0.5 SENSITIVE Sensitive     Inducible Clindamycin NEGATIVE Sensitive     * STAPHYLOCOCCUS HOMINIS  Blood Culture ID Panel (Reflexed)     Status: Abnormal   Collection Time: 01/20/24 10:54 AM  Result Value Ref Range Status   Enterococcus faecalis NOT DETECTED NOT DETECTED Final   Enterococcus Faecium NOT DETECTED NOT DETECTED Final   Listeria monocytogenes NOT DETECTED NOT DETECTED Final    Staphylococcus species DETECTED (A) NOT DETECTED Final    Comment: CRITICAL RESULT CALLED TO, READ BACK BY AND VERIFIED WITH: JASON ROBBINS 01/21/24 0504 MW    Staphylococcus aureus (BCID) NOT DETECTED NOT DETECTED Final   Staphylococcus epidermidis NOT DETECTED NOT DETECTED Final   Staphylococcus lugdunensis NOT DETECTED NOT DETECTED Final   Streptococcus species NOT DETECTED NOT DETECTED Final   Streptococcus agalactiae NOT DETECTED NOT DETECTED Final   Streptococcus pneumoniae NOT DETECTED NOT DETECTED Final   Streptococcus pyogenes NOT DETECTED NOT DETECTED Final   A.calcoaceticus-baumannii NOT DETECTED NOT DETECTED Final   Bacteroides fragilis NOT DETECTED NOT DETECTED Final   Enterobacterales NOT DETECTED NOT DETECTED Final   Enterobacter cloacae complex NOT DETECTED NOT DETECTED Final   Escherichia coli NOT DETECTED NOT DETECTED Final   Klebsiella aerogenes NOT DETECTED NOT DETECTED Final   Klebsiella oxytoca NOT DETECTED NOT DETECTED Final   Klebsiella pneumoniae NOT DETECTED NOT DETECTED Final   Proteus species NOT DETECTED NOT DETECTED Final   Salmonella species NOT DETECTED NOT DETECTED Final   Serratia marcescens NOT DETECTED NOT DETECTED Final   Haemophilus influenzae NOT DETECTED NOT DETECTED Final   Neisseria meningitidis NOT DETECTED NOT DETECTED Final   Pseudomonas aeruginosa NOT DETECTED NOT DETECTED Final   Stenotrophomonas maltophilia NOT DETECTED NOT DETECTED Final   Candida albicans NOT DETECTED NOT DETECTED Final   Candida auris NOT DETECTED NOT DETECTED Final   Candida glabrata NOT DETECTED NOT DETECTED Final   Candida krusei NOT DETECTED NOT DETECTED Final   Candida parapsilosis NOT DETECTED NOT DETECTED Final   Candida tropicalis NOT DETECTED NOT DETECTED  Final   Cryptococcus neoformans/gattii NOT DETECTED NOT DETECTED Final    Comment: Performed at [facility], [address] [place], [place] [address]  Resp panel by RT-PCR (RSV, Flu A&B, Covid)  Anterior Nasal Swab     Status: None   Collection Time: 01/20/24 11:00 AM   Specimen: Anterior Nasal Swab  Result Value Ref Range Status   SARS Coronavirus 2 by RT PCR NEGATIVE NEGATIVE Final    Comment: (NOTE) SARS-CoV-2 target nucleic acids are NOT DETECTED.  The SARS-CoV-2 RNA is generally detectable in upper respiratory specimens during the acute phase of infection. The lowest concentration of SARS-CoV-2 viral copies this assay can detect is 138 copies/mL. A negative result does not preclude SARS-Cov-2 infection and should not be used as the sole basis for treatment or other patient management decisions. A negative result may occur with  improper specimen collection/handling, submission of specimen other than nasopharyngeal swab, presence of viral mutation(s) within the areas targeted by this assay, and inadequate number of viral copies(<138 copies/mL). A negative result must be combined with clinical observations, patient history, and epidemiological information. The expected result is Negative.  Fact Sheet for Patients:  BloggerCourse.com  Fact Sheet for Healthcare Providers:  SeriousBroker.it  This test is no t yet approved or cleared by the [place] FDA and  has been authorized for detection and/or diagnosis of SARS-CoV-2 by FDA under an Emergency Use Authorization (EUA). This EUA will remain  in effect (meaning this test can be used) for the duration of the COVID-19 declaration under Section 564(b)(1) of the Act, 21 U.S.C.section 360bbb-3(b)(1), unless the authorization is terminated  or revoked sooner.       Influenza A by PCR NEGATIVE NEGATIVE Final   Influenza B by PCR NEGATIVE NEGATIVE Final    Comment: (NOTE) The Xpert Xpress SARS-CoV-2/FLU/RSV plus assay is intended as an aid in the diagnosis of influenza from Nasopharyngeal swab specimens and should not be used as a sole basis for treatment. Nasal washings  and aspirates are unacceptable for Xpert Xpress SARS-CoV-2/FLU/RSV testing.  Fact Sheet for Patients: BloggerCourse.com  Fact Sheet for Healthcare Providers: SeriousBroker.it  This test is not yet approved or cleared by the [place] FDA and has been authorized for detection and/or diagnosis of SARS-CoV-2 by FDA under an Emergency Use Authorization (EUA). This EUA will remain in effect (meaning this test can be used) for the duration of the COVID-19 declaration under Section 564(b)(1) of the Act, 21 U.S.C. section 360bbb-3(b)(1), unless the authorization is terminated or revoked.     Resp Syncytial Virus by PCR NEGATIVE NEGATIVE Final    Comment: (NOTE) Fact Sheet for Patients: BloggerCourse.com  Fact Sheet for Healthcare Providers: SeriousBroker.it  This test is not yet approved or cleared by the [place] FDA and has been authorized for detection and/or diagnosis of SARS-CoV-2 by FDA under an Emergency Use Authorization (EUA). This EUA will remain in effect (meaning this test can be used) for the duration of the COVID-19 declaration under Section 564(b)(1) of the Act, 21 U.S.C. section 360bbb-3(b)(1), unless the authorization is terminated or revoked.  Performed at [facility] [address] [place], [place] [address]   Blood culture (routine x 2)     Status: Abnormal (Preliminary result)   Collection Time: 01/20/24  9:02 PM   Specimen: BLOOD LEFT WRIST  Result Value Ref Range Status   Specimen Description BLOOD LEFT WRIST  Final   Special Requests   Final    BOTTLES DRAWN  AEROBIC AND ANAEROBIC Blood Culture adequate volume   Culture  Setup Time   Final    GRAM POSITIVE COCCI IN BOTH AEROBIC AND ANAEROBIC BOTTLES CRITICAL VALUE NOTED.  VALUE IS CONSISTENT WITH PREVIOUSLY REPORTED AND CALLED VALUE.    Culture (A)  Final    STAPHYLOCOCCUS  HOMINIS STAPHYLOCOCCUS EPIDERMIDIS CULTURE REINCUBATED FOR BETTER GROWTH    Report Status PENDING  Incomplete   Organism ID, Bacteria STAPHYLOCOCCUS HOMINIS  Final      Susceptibility   Staphylococcus hominis - MIC*    CIPROFLOXACIN 4 RESISTANT Resistant     ERYTHROMYCIN RESISTANT Resistant     GENTAMICIN <=0.5 SENSITIVE Sensitive     OXACILLIN >=4 RESISTANT Resistant     TETRACYCLINE <=1 SENSITIVE Sensitive     VANCOMYCIN 1 SENSITIVE Sensitive     TRIMETH/SULFA <=10 SENSITIVE Sensitive     CLINDAMYCIN RESISTANT Resistant     RIFAMPIN <=0.5 SENSITIVE Sensitive     Inducible Clindamycin Value in next row Resistant      POSITIVEPerformed at [facility], [address] [place], [place] [address]    * STAPHYLOCOCCUS HOMINIS  Culture, blood (Routine X 2) w Reflex to ID Panel     Status: None (Preliminary result)   Collection Time: 01/22/24 10:09 AM   Specimen: BLOOD  Result Value Ref Range Status   Specimen Description BLOOD BLOOD LEFT HAND  Final   Special Requests   Final    BOTTLES DRAWN AEROBIC AND ANAEROBIC Blood Culture results may not be optimal due to an inadequate volume of blood received in culture bottles   Culture   Final    NO GROWTH 2 DAYS Performed at [facility], [address], [place], [place] [address]    Report Status PENDING  Incomplete  Culture, blood (Routine X 2) w Reflex to ID Panel     Status: None (Preliminary result)   Collection Time: 01/22/24 10:16 AM   Specimen: BLOOD  Result Value Ref Range Status   Specimen Description BLOOD BLOOD RIGHT HAND  Final   Special Requests   Final    BOTTLES DRAWN AEROBIC AND ANAEROBIC Blood Culture results may not be optimal due to an inadequate volume of blood received in culture bottles   Culture   Final    NO GROWTH 2 DAYS Performed at [facility], [address] [place], [place] [address]    Report Status PENDING  Incomplete   Scheduled Meds:  amLODipine  10 mg Oral Daily    divalproex  250 mg Oral BID   enoxaparin (LOVENOX) injection  45 mg Subcutaneous Q24H   levothyroxine  25 mcg Oral Q0600   lipase/protease/amylase  48,000 Units Oral TID AC   melatonin  10 mg Oral QHS   risperiDONE  2 mg Oral QHS   sodium chloride  1 g Oral BID   venlafaxine XR  150 mg Oral QHS   Continuous Infusions:   LOS: 4 days   Time spent: 40 minutes  Carollee Herter, DO  [facility]  01/24/2024, 12:47 PM

## 2024-01-24 NOTE — Plan of Care (Signed)
  Problem: Clinical Measurements: Goal: Ability to maintain clinical measurements within normal limits will improve Outcome: Progressing Goal: Will remain free from infection Outcome: Progressing Goal: Respiratory complications will improve Outcome: Progressing Goal: Cardiovascular complication will be avoided Outcome: Progressing   Problem: Nutrition: Goal: Adequate nutrition will be maintained Outcome: Progressing   Problem: Elimination: Goal: Will not experience complications related to urinary retention Outcome: Progressing   Problem: Education: Goal: Knowledge of General Education information will improve Description: Including pain rating scale, medication(s)/side effects and non-pharmacologic comfort measures Outcome: Not Progressing   Problem: Activity: Goal: Risk for activity intolerance will decrease Outcome: Not Progressing   Problem: Pain Managment: Goal: General experience of comfort will improve and/or be controlled Outcome: Not Progressing

## 2024-01-24 NOTE — Plan of Care (Addendum)
Assisted with set up of Breakfast.  1230 Assisted with set up of Lunch  1625 Assisted with set up of Dinner

## 2024-01-24 NOTE — Plan of Care (Signed)
Family (James and [name]) both notified of patient's floor and room change (d/t to staffing constraints).  Patient was moved from 3B 330 to 2C 227.

## 2024-01-25 DIAGNOSIS — N179 Acute kidney failure, unspecified: Secondary | ICD-10-CM | POA: Diagnosis not present

## 2024-01-25 DIAGNOSIS — R3989 Other symptoms and signs involving the genitourinary system: Secondary | ICD-10-CM | POA: Diagnosis not present

## 2024-01-25 DIAGNOSIS — G9341 Metabolic encephalopathy: Secondary | ICD-10-CM | POA: Diagnosis not present

## 2024-01-25 DIAGNOSIS — R7881 Bacteremia: Secondary | ICD-10-CM | POA: Diagnosis not present

## 2024-01-25 LAB — CULTURE, BLOOD (ROUTINE X 2): Special Requests: ADEQUATE

## 2024-01-25 NOTE — Plan of Care (Signed)

## 2024-01-25 NOTE — Progress Notes (Signed)
PROGRESS NOTE    Jacqueline Williams  FIE:[id number] DOB: [birth year] DOA: 01/20/2024 PCP: Melonie Florida, FNP  Subjective: Pt seen and examined. Pt completed her 3 days of Bactrim. No impact on her renal function.  Repeat blood cx are still negative. I think the staph epi and staph hominis were both contaminants.   Hospital Course: HPI: Jacqueline Williams is a 73 y.o. female with medical history significant of HTN, chronic pain syndrome, GERD, neuropathy, GAD, acquired scoliosis, obesity, history of neurogenic claudication due to lumbar spinal stenosis, severe depression, insomnia presenting with encephalopathy, AKI, UTI.  Limited history in the setting of encephalopathy.  Per report, patient sent from [facility] with concern for lethargy and mild confusion.  Some concern for?  CVA.  Also noted with recent UTI treatment though antibiotic regimen not specified.  No reported fevers or chills.  No reported chest pain or belly pain.  Mild back pain and?  Dysuria per patient.  No focal hemiparesis or confusion.   Significant Events: Admitted 01/20/2024 for AKI   Significant Labs: White count 6.5, hemoglobin 15.3, platelets 261, lactate 2.4. Troponin 30s to 20s. COVID flu and RSV negative. VBG stable. Creatinine 1.64.   Significant Imaging Studies: CT head shows No acute CT finding. Age related volume loss. Prominence of the lateral and third ventricles, minimally increased since the study of 2023. This may be due to central volume loss, but normal pressure hydrocephalus could be considered CXR shows No active cardiopulmonary disease   Antibiotic Therapy: Anti-infectives (From admission, onward)    Start     Dose/Rate Route Frequency Ordered Stop   01/21/24 1330  ciprofloxacin (CIPRO) IVPB 400 mg  Status:  Discontinued        400 mg 200 mL/hr over 60 Minutes Intravenous Every 24 hours 01/20/24 1615 01/20/24 1621   01/21/24 0200  ciprofloxacin (CIPRO) IVPB 400 mg        400 mg 200 mL/hr over 60  Minutes Intravenous Every 12 hours 01/20/24 1621     01/20/24 2300  ciprofloxacin (CIPRO) IVPB 400 mg  Status:  Discontinued        400 mg 200 mL/hr over 60 Minutes Intravenous Every 12 hours 01/20/24 1605 01/20/24 1615   01/20/24 1215  ciprofloxacin (CIPRO) IVPB 400 mg        400 mg 200 mL/hr over 60 Minutes Intravenous  Once 01/20/24 1204 01/20/24 1425       Procedures:   Consultants:     Assessment and Plan: * AKI (acute kidney injury) (HCC) On admission, Cr 1.6 today w/ GFR in the 30s . Baseline Cr 0.7. Suspect prerenal etiology. Will gently hydrate patient. Check FeNa. Hold nephrotoxic agents. Follow. 01-21-2024 AKI has resolved. Today, BUN 22, scr 0.89. stop IVF 01-22-2024 BUN 10, Scr 0.64 today. 01-24-2024 stable. Scr 0.64, BUN 8. Tolerating bactrim.  01-25-2024 resolved   Suspected UTI On admission, Noted worsening lethargy and confusion w/ ? Recent treatment for UTI. Pt does report ? Dysuria and back pain. UA pending. Will empirically cover w/ IV cipro pending urine studies. Otherwise monitor. De-escalate as appropriate. 01-21-2024 have placed call to [facility] SN[facility] to inquire if any urine cx were sent last week when she was treated for 5 days with macrobid. Will need to ask pharmacy about abx choices given listed "anaphylaxis" to PCN. Will need to investigate if pt has ever had cephalosporins before. Urine cx E. Coli 01-06-2024 Resistant to ampicillin, cipro, levaquin Sensitive to tobra, zosyn, macrobid, bactrim, cefepime,  rocephin, invanz, gent, cefazolin. 01-22-2024 after discussion with ID pharmacist. Started 3 days of Bactrim DS to treat E. Coli UTI. Monitor Scr 01-23-2024 continue with bactrim. Monitoring Scr  01-25-2024 resolved. Treated with 3 days of PO bactrim.  01-24-2024 Scr 0.64, BUN 8. Tolerating bactrim.   Gram-positive cocci bacteremia 01-22-2024 unclear if pt's GPC bacteremia is actually a true infection vs contaminant of blood cx. Will repeat blood  cx today. Hold off on starting IV vanco. Discussed with ID pharmacist. Pt is hemodynamically stable. No leukocytosis. No fevers. Culture Results: Lab Results  Component Value Date/Time   SDES  01/20/2024 09:02 PM    BLOOD LEFT WRIST Performed at [facility], [address] [place], [place] [address]    SDES  01/20/2024 10:54 AM    BLOOD RIGHT FOREARM Performed at [facility], [address] [place], [place] [address]    SPECREQUEST  01/20/2024 09:02 PM    BOTTLES DRAWN AEROBIC AND ANAEROBIC Blood Culture adequate volume   SPECREQUEST  01/20/2024 10:54 AM    BOTTLES DRAWN AEROBIC AND ANAEROBIC Blood Culture results may not be optimal due to an inadequate volume of blood received in culture bottles Performed at [facility], [address] [place], [place] [address]    SETUPTIME  01/20/2024 09:02 PM    GRAM POSITIVE COCCI IN BOTH AEROBIC AND ANAEROBIC BOTTLES CRITICAL VALUE NOTED.  VALUE IS CONSISTENT WITH PREVIOUSLY REPORTED AND CALLED VALUE. Performed at [facility], [address] [place], [place] [address]    SETUPTIME  01/20/2024 10:54 AM    Organism ID to follow GRAM POSITIVE COCCI IN BOTH AEROBIC AND ANAEROBIC BOTTLES CRITICAL RESULT CALLED TO, READ BACK BY AND VERIFIED WITH: JASON ROBBINS 01/21/24 0504 MW GRAM STAIN REVIEWED-AGREE WITH RESULT DRT Performed at [facility], [address] [place], [place] [address]    Elpidio Anis POSITIVE COCCI 01/20/2024 09:02 PM   CULT GRAM POSITIVE COCCI 01/20/2024 10:54 AM   REPTSTATUS PENDING 01/20/2024 09:02 PM   REPTSTATUS PENDING 01/20/2024 10:54 AM   01-23-2024 Blood cx grew staph epi and staph hominis from different bottles. I still think this is a blood cx contaminant.  Repeat blood cx pending. 01-24-2024 repeat blood negative. WBC 3.6 today. Afebrile. I still think her initial blood cx were contaminated since they grew two different organisms.  Staph hominis and staph epi   01-25-2024  repeat blood cx are negative. I think staph epi and staph hominis were both blood culture contaminant.  Acute metabolic encephalopathy On admission, + generalized encephalopathy on presentation. Suspect likely multifactorial w/ contributions of AKI, UTI, chronic dementia. Unclear of baseline. CT head WNL. Monitor 01-21-2024 pt is awake and alert. She knows that she has been living at [facility] for several years now. Knows that she is in the hospital. 01-22-2024 resolved. Pt is AxOx3 now   Hypokalemia 01-21-2024 replete with po kcl 40 meq q4h prn.  01-22-2024 resolved. K 3.6 today  Benign essential HTN On admission, BP stable. Titrate home regimen. 01-21-2024 stable. 01-22-2024 stable 01-23-2024 stable. On norvasc 10 mg daily. Can restart ACEI if BP starts to rise but while on bactrim would be cautious. 01-24-2024 stable. On norvasc 10 mg daily. 01-25-2024 stable. On norvasc 10 mg daily.   Class 2 obesity 01-21-2024 BMI 34.23 01-22-2024 stable 01-23-2024 stable 01-25-2024 stable  Hypothyroidism On admission, Cont synthroid. 01-21-2024 continue synthroid 25 mg daily. 01-22-2024 stable 01-23-2024 stable 01-24-2024 stable  01-25-2024 stable  DVT prophylaxis:   Lovenox   Code Status: Full Code Family Communication: no family at bedside Disposition Plan: SNF Reason for continuing need for hospitalization: medically stable for DC back to SNF.  Objective: Vitals:   01/24/24 1600 01/24/24 2025 01/25/24 0218 01/25/24 0806  BP: 126/75 (!) 150/71 128/60 (!) 131/55  Pulse: 85 80 81 76  Resp: 20 16 18 16   Temp: 98.8 F (37.1 C) 99.1 F (37.3 C) 98.9 F (37.2 C) 98.2 F (36.8 C)  TempSrc: Oral   Oral  SpO2: 98% 95% 96% 96%  Weight:      Height:        Intake/Output Summary (Last 24 hours) at 01/25/2024 1400 Last data filed at 01/24/2024 1600 Gross per 24 hour  Intake --  Output 1200 ml  Net -1200 ml   Filed Weights   01/20/24 0918  Weight: 96.2 kg     Examination:  Physical Exam Vitals and nursing note reviewed.  Constitutional:      Comments: Chronically ill appearing  HENT:     Head: Normocephalic and atraumatic.  Eyes:     General: No scleral icterus. Cardiovascular:     Rate and Rhythm: Normal rate and regular rhythm.  Pulmonary:     Effort: Pulmonary effort is normal.     Breath sounds: Normal breath sounds.  Abdominal:     General: Bowel sounds are normal.  Musculoskeletal:     Right lower leg: No edema.     Left lower leg: No edema.  Skin:    General: Skin is warm and dry.     Capillary Refill: Capillary refill takes less than 2 seconds.  Neurological:     Mental Status: She is alert and oriented to person, place, and time.    Data Reviewed: I have personally reviewed following labs and imaging studies  CBC: Recent Labs  Lab 01/20/24 0903 01/21/24 0455 01/22/24 0702 01/24/24 0606  WBC 6.5 6.1 4.1 3.6*  NEUTROABS  --   --  2.2 1.5*  HGB 15.3* 14.1 13.0 12.9  HCT 45.6 42.2 38.1 38.7  MCV 90.5 91.9 89.0 91.1  PLT 261 195 205 212   Basic Metabolic Panel: Recent Labs  Lab 01/20/24 0903 01/21/24 0455 01/22/24 0702 01/24/24 0606  NA 138 137 138 137  K 3.1* 3.1* 3.6 3.5  CL 100 101 104 102  CO2 25 25 24 24   GLUCOSE 99 114* 88 78  BUN 30* 22 10 8   CREATININE 1.64* 0.89 0.64 0.64  CALCIUM 8.6* 8.4* 8.3* 8.5*   GFR: Estimated Creatinine Clearance: 76.5 mL/min (by C-G formula based on SCr of 0.64 mg/dL). Liver Function Tests: Recent Labs  Lab 01/20/24 0903 01/21/24 0455 01/22/24 0702 01/24/24 0606  AST 23 26 18  33  ALT 18 21 17 29   ALKPHOS 76 70 64 62  BILITOT 0.5 0.4 0.6 0.6  PROT 6.8 6.2* 5.5* 5.7*  ALBUMIN 3.3* 2.9* 2.7* 2.7*    Recent Labs  Lab 01/20/24 1020  AMMONIA 17   Coagulation Profile: Recent Labs  Lab 01/20/24 0903  INR 1.0   BNP (last 3 results) Recent Labs    01/20/24 0903  BNP 25.8   CBG: Recent Labs  Lab 01/23/24 1209  GLUCAP 105*   Sepsis  Labs: Recent Labs  Lab 01/20/24 1124 01/21/24 2107  LATICACIDVEN 2.4* 2.1*    Recent Results (from the past 240 hours)  Blood culture (routine x 2)     Status: Abnormal   Collection Time: 01/20/24  10:54 AM   Specimen: BLOOD RIGHT FOREARM  Result Value Ref Range Status   Specimen Description   Final    BLOOD RIGHT FOREARM Performed at [facility], [address] [place], [place] [address]    Special Requests   Final    BOTTLES DRAWN AEROBIC AND ANAEROBIC Blood Culture results may not be optimal due to an inadequate volume of blood received in culture bottles Performed at [facility], [address] [place], [place] [address]    Culture  Setup Time   Final    Organism ID to follow GRAM POSITIVE COCCI IN BOTH AEROBIC AND ANAEROBIC BOTTLES CRITICAL RESULT CALLED TO, READ BACK BY AND VERIFIED WITH: JASON ROBBINS 01/21/24 0504 MW GRAM STAIN REVIEWED-AGREE WITH RESULT DRT Performed at [facility], [address], [place], [place] [address]    Culture (A)  Final    STAPHYLOCOCCUS EPIDERMIDIS THE SIGNIFICANCE OF ISOLATING THIS ORGANISM FROM A SINGLE SET OF BLOOD CULTURES WHEN MULTIPLE SETS ARE DRAWN IS UNCERTAIN. PLEASE NOTIFY THE MICROBIOLOGY DEPARTMENT WITHIN ONE WEEK IF SPECIATION AND SENSITIVITIES ARE REQUIRED. STAPHYLOCOCCUS HOMINIS    Report Status 01/23/2024 FINAL  Final   Organism ID, Bacteria STAPHYLOCOCCUS HOMINIS  Final      Susceptibility   Staphylococcus hominis - MIC*    CIPROFLOXACIN <=0.5 SENSITIVE Sensitive     ERYTHROMYCIN <=0.25 SENSITIVE Sensitive     GENTAMICIN <=0.5 SENSITIVE Sensitive     OXACILLIN <=0.25 SENSITIVE Sensitive     TETRACYCLINE 2 SENSITIVE Sensitive     VANCOMYCIN <=0.5 SENSITIVE Sensitive     TRIMETH/SULFA <=10 SENSITIVE Sensitive     CLINDAMYCIN <=0.25 SENSITIVE Sensitive     RIFAMPIN <=0.5 SENSITIVE Sensitive     Inducible Clindamycin NEGATIVE Sensitive     * STAPHYLOCOCCUS HOMINIS  Blood Culture ID Panel  (Reflexed)     Status: Abnormal   Collection Time: 01/20/24 10:54 AM  Result Value Ref Range Status   Enterococcus faecalis NOT DETECTED NOT DETECTED Final   Enterococcus Faecium NOT DETECTED NOT DETECTED Final   Listeria monocytogenes NOT DETECTED NOT DETECTED Final   Staphylococcus species DETECTED (A) NOT DETECTED Final    Comment: CRITICAL RESULT CALLED TO, READ BACK BY AND VERIFIED WITH: JASON ROBBINS 01/21/24 0504 MW    Staphylococcus aureus (BCID) NOT DETECTED NOT DETECTED Final   Staphylococcus epidermidis NOT DETECTED NOT DETECTED Final   Staphylococcus lugdunensis NOT DETECTED NOT DETECTED Final   Streptococcus species NOT DETECTED NOT DETECTED Final   Streptococcus agalactiae NOT DETECTED NOT DETECTED Final   Streptococcus pneumoniae NOT DETECTED NOT DETECTED Final   Streptococcus pyogenes NOT DETECTED NOT DETECTED Final   A.calcoaceticus-baumannii NOT DETECTED NOT DETECTED Final   Bacteroides fragilis NOT DETECTED NOT DETECTED Final   Enterobacterales NOT DETECTED NOT DETECTED Final   Enterobacter cloacae complex NOT DETECTED NOT DETECTED Final   Escherichia coli NOT DETECTED NOT DETECTED Final   Klebsiella aerogenes NOT DETECTED NOT DETECTED Final   Klebsiella oxytoca NOT DETECTED NOT DETECTED Final   Klebsiella pneumoniae NOT DETECTED NOT DETECTED Final   Proteus species NOT DETECTED NOT DETECTED Final   Salmonella species NOT DETECTED NOT DETECTED Final   Serratia marcescens NOT DETECTED NOT DETECTED Final   Haemophilus influenzae NOT DETECTED NOT DETECTED Final   Neisseria meningitidis NOT DETECTED NOT DETECTED Final   Pseudomonas aeruginosa NOT DETECTED NOT DETECTED Final   Stenotrophomonas maltophilia NOT DETECTED NOT DETECTED Final   Candida albicans NOT DETECTED NOT DETECTED Final  Candida auris NOT DETECTED NOT DETECTED Final   Candida glabrata NOT DETECTED NOT DETECTED Final   Candida krusei NOT DETECTED NOT DETECTED Final   Candida parapsilosis NOT DETECTED  NOT DETECTED Final   Candida tropicalis NOT DETECTED NOT DETECTED Final   Cryptococcus neoformans/gattii NOT DETECTED NOT DETECTED Final    Comment: Performed at [facility], [address] [place], [place] [address]  Resp panel by RT-PCR (RSV, Flu A&B, Covid) Anterior Nasal Swab     Status: None   Collection Time: 01/20/24 11:00 AM   Specimen: Anterior Nasal Swab  Result Value Ref Range Status   SARS Coronavirus 2 by RT PCR NEGATIVE NEGATIVE Final    Comment: (NOTE) SARS-CoV-2 target nucleic acids are NOT DETECTED.  The SARS-CoV-2 RNA is generally detectable in upper respiratory specimens during the acute phase of infection. The lowest concentration of SARS-CoV-2 viral copies this assay can detect is 138 copies/mL. A negative result does not preclude SARS-Cov-2 infection and should not be used as the sole basis for treatment or other patient management decisions. A negative result may occur with  improper specimen collection/handling, submission of specimen other than nasopharyngeal swab, presence of viral mutation(s) within the areas targeted by this assay, and inadequate number of viral copies(<138 copies/mL). A negative result must be combined with clinical observations, patient history, and epidemiological information. The expected result is Negative.  Fact Sheet for Patients:  BloggerCourse.com  Fact Sheet for Healthcare Providers:  SeriousBroker.it  This test is no t yet approved or cleared by the [place] FDA and  has been authorized for detection and/or diagnosis of SARS-CoV-2 by FDA under an Emergency Use Authorization (EUA). This EUA will remain  in effect (meaning this test can be used) for the duration of the COVID-19 declaration under Section 564(b)(1) of the Act, 21 U.S.C.section 360bbb-3(b)(1), unless the authorization is terminated  or revoked sooner.       Influenza A by PCR NEGATIVE NEGATIVE  Final   Influenza B by PCR NEGATIVE NEGATIVE Final    Comment: (NOTE) The Xpert Xpress SARS-CoV-2/FLU/RSV plus assay is intended as an aid in the diagnosis of influenza from Nasopharyngeal swab specimens and should not be used as a sole basis for treatment. Nasal washings and aspirates are unacceptable for Xpert Xpress SARS-CoV-2/FLU/RSV testing.  Fact Sheet for Patients: BloggerCourse.com  Fact Sheet for Healthcare Providers: SeriousBroker.it  This test is not yet approved or cleared by the [place] FDA and has been authorized for detection and/or diagnosis of SARS-CoV-2 by FDA under an Emergency Use Authorization (EUA). This EUA will remain in effect (meaning this test can be used) for the duration of the COVID-19 declaration under Section 564(b)(1) of the Act, 21 U.S.C. section 360bbb-3(b)(1), unless the authorization is terminated or revoked.     Resp Syncytial Virus by PCR NEGATIVE NEGATIVE Final    Comment: (NOTE) Fact Sheet for Patients: BloggerCourse.com  Fact Sheet for Healthcare Providers: SeriousBroker.it  This test is not yet approved or cleared by the [place] FDA and has been authorized for detection and/or diagnosis of SARS-CoV-2 by FDA under an Emergency Use Authorization (EUA). This EUA will remain in effect (meaning this test can be used) for the duration of the COVID-19 declaration under Section 564(b)(1) of the Act, 21 U.S.C. section 360bbb-3(b)(1), unless the authorization is terminated or revoked.  Performed at [facility], [address] [place], [place] [address]   Blood culture (routine x 2)     Status: Abnormal  Collection Time: 01/20/24  9:02 PM   Specimen: BLOOD LEFT WRIST  Result Value Ref Range Status   Specimen Description   Final    BLOOD LEFT WRIST Performed at [facility], [address] [place], [place]  [address]    Special Requests   Final    BOTTLES DRAWN AEROBIC AND ANAEROBIC Blood Culture adequate volume Performed at [facility], [address] [place], [place] [address]    Culture  Setup Time   Final    GRAM POSITIVE COCCI IN BOTH AEROBIC AND ANAEROBIC BOTTLES CRITICAL VALUE NOTED.  VALUE IS CONSISTENT WITH PREVIOUSLY REPORTED AND CALLED VALUE. Performed at [facility], [address] [place], [place] [address]    Culture (A)  Final    STAPHYLOCOCCUS HOMINIS STAPHYLOCOCCUS EPIDERMIDIS    Report Status 01/25/2024 FINAL  Final   Organism ID, Bacteria STAPHYLOCOCCUS HOMINIS  Final   Organism ID, Bacteria STAPHYLOCOCCUS EPIDERMIDIS  Final      Susceptibility   Staphylococcus epidermidis - MIC*    CIPROFLOXACIN >=8 RESISTANT Resistant     ERYTHROMYCIN >=8 RESISTANT Resistant     GENTAMICIN <=0.5 SENSITIVE Sensitive     OXACILLIN >=4 RESISTANT Resistant     TETRACYCLINE <=1 SENSITIVE Sensitive     VANCOMYCIN 2 SENSITIVE Sensitive     TRIMETH/SULFA 20 SENSITIVE Sensitive     CLINDAMYCIN RESISTANT Resistant     RIFAMPIN <=0.5 SENSITIVE Sensitive     Inducible Clindamycin POSITIVE Resistant     * STAPHYLOCOCCUS EPIDERMIDIS   Staphylococcus hominis - MIC*    CIPROFLOXACIN 4 RESISTANT Resistant     ERYTHROMYCIN RESISTANT Resistant     GENTAMICIN <=0.5 SENSITIVE Sensitive     OXACILLIN >=4 RESISTANT Resistant     TETRACYCLINE <=1 SENSITIVE Sensitive     VANCOMYCIN 1 SENSITIVE Sensitive     TRIMETH/SULFA <=10 SENSITIVE Sensitive     CLINDAMYCIN RESISTANT Resistant     RIFAMPIN <=0.5 SENSITIVE Sensitive     Inducible Clindamycin POSITIVE Resistant     * STAPHYLOCOCCUS HOMINIS  Culture, blood (Routine X 2) w Reflex to ID Panel     Status: None (Preliminary result)   Collection Time: 01/22/24 10:09 AM   Specimen: BLOOD  Result Value Ref Range Status   Specimen Description BLOOD BLOOD LEFT HAND  Final   Special Requests   Final    BOTTLES DRAWN AEROBIC AND  ANAEROBIC Blood Culture results may not be optimal due to an inadequate volume of blood received in culture bottles   Culture   Final    NO GROWTH 3 DAYS Performed at [facility], [address], [place], [place] [address]    Report Status PENDING  Incomplete  Culture, blood (Routine X 2) w Reflex to ID Panel     Status: None (Preliminary result)   Collection Time: 01/22/24 10:16 AM   Specimen: BLOOD  Result Value Ref Range Status   Specimen Description BLOOD BLOOD RIGHT HAND  Final   Special Requests   Final    BOTTLES DRAWN AEROBIC AND ANAEROBIC Blood Culture results may not be optimal due to an inadequate volume of blood received in culture bottles   Culture   Final    NO GROWTH 3 DAYS Performed at [facility], [address], [place], [place] [address]    Report Status PENDING  Incomplete     Radiology Studies: No results found.  Scheduled Meds:  amLODipine  10 mg Oral Daily   divalproex  250 mg Oral BID   enoxaparin (LOVENOX) injection  45 mg Subcutaneous Q24H   levothyroxine  25 mcg Oral Q0600   lipase/protease/amylase  48,000 Units Oral TID AC   melatonin  10 mg Oral QHS   risperiDONE  2 mg Oral QHS   sodium chloride  1 g Oral BID   venlafaxine XR  150 mg Oral QHS   Continuous Infusions:   LOS: 5 days   Time spent: 40 minutes  Carollee Herter, DO  [facility]  01/25/2024, 2:00 PM

## 2024-01-25 NOTE — Plan of Care (Signed)

## 2024-01-26 DIAGNOSIS — R3989 Other symptoms and signs involving the genitourinary system: Secondary | ICD-10-CM | POA: Diagnosis not present

## 2024-01-26 DIAGNOSIS — G9341 Metabolic encephalopathy: Secondary | ICD-10-CM | POA: Diagnosis not present

## 2024-01-26 DIAGNOSIS — R7881 Bacteremia: Secondary | ICD-10-CM | POA: Diagnosis not present

## 2024-01-26 DIAGNOSIS — N179 Acute kidney failure, unspecified: Secondary | ICD-10-CM | POA: Diagnosis not present

## 2024-01-26 MED ORDER — LORAZEPAM 1 MG PO TABS: 0.5000 mg | ORAL_TABLET | Freq: Every day | ORAL | 0 refills | Status: AC | PRN

## 2024-01-26 MED ORDER — GABAPENTIN 100 MG PO CAPS: 100.0000 mg | ORAL_CAPSULE | Freq: Every evening | ORAL | 0 refills | Status: AC | PRN

## 2024-01-26 MED ORDER — OXYCODONE-ACETAMINOPHEN 5-325 MG PO TABS: 1.0000 | ORAL_TABLET | Freq: Two times a day (BID) | ORAL | 0 refills | Status: AC | PRN

## 2024-01-26 MED ORDER — FUROSEMIDE 20 MG PO TABS: 20.0000 mg | ORAL_TABLET | Freq: Every day | ORAL | Status: AC | PRN

## 2024-01-26 NOTE — Plan of Care (Signed)

## 2024-01-26 NOTE — TOC Transition Note (Signed)
Transition of Care [facility]) - Discharge Note   Patient Details  Name: Jacqueline Williams MRN: [id number] Date of Birth: 05/19/1951  [facility] CM/SW Contact:  Chapman Fitch, RN Phone Number: 01/26/2024, 10:23 AM   Clinical Narrative:     Patient will DC to: [facility] Anticipated DC date: 01/26/24  Family notified: VM left for daughter Dance movement psychotherapist by: [facility]  Per MD patient ready for DC to . RN, , patient's family, and facility notified of DC. Discharge Summary sent to facility. RN given number for report. DC packet on chart. Ambulance transport requested for patient.   Signed scripts and EMS packet on chart Debra at [facility] confirms no Fl2 needed TOC signing off.     Barriers to Discharge: Continued Medical Work up   Patient Goals and CMS Choice            Discharge Placement                       Discharge Plan and Services Additional resources added to the After Visit Summary for                                       Social Drivers of Health (SDOH) Interventions SDOH Screenings   Food Insecurity: Patient Unable To Answer (01/21/2024)  Housing: Patient Unable To Answer (01/21/2024)  Transportation Needs: Patient Unable To Answer (01/21/2024)  Utilities: Patient Unable To Answer (01/21/2024)  Social Connections: Patient Unable To Answer (01/21/2024)  Tobacco Use: Low Risk  (01/21/2024)     Readmission Risk Interventions     No data to display

## 2024-01-26 NOTE — Discharge Summary (Addendum)
[facility] Phys[facility] Discharge Summary   Patient name: Jacqueline Williams  Admit date:     01/20/2024  Discharge date: 01/26/2024  Attending Physician: Floydene Flock [[id number]]  Discharge Physician: Carollee Herter   PCP: Melonie Florida, FNP  Admitted From: SNF [facility] Disposition:   [facility]  SNF  Recommendations for Outpatient Follow-up:  Follow up with PCP in 1-2 weeks  Home Health:No Equipment/Devices: None    Discharge Condition:Stable CODE STATUS:FULL Diet recommendation: Heart Healthy Fluid Restriction: None  Hospital Summary: HPI: Jacqueline Williams is a 73 y.o. female with medical history significant of HTN, chronic pain syndrome, GERD, neuropathy, GAD, acquired scoliosis, obesity, history of neurogenic claudication due to lumbar spinal stenosis, severe depression, insomnia presenting with encephalopathy, AKI, UTI.  Limited history in the setting of encephalopathy.  Per report, patient sent from [facility] with concern for lethargy and mild confusion.  Some concern for?  CVA.  Also noted with recent UTI treatment though antibiotic regimen not specified.  No reported fevers or chills.  No reported chest pain or belly pain.  Mild back pain and?  Dysuria per patient.  No focal hemiparesis or confusion.   Significant Events: Admitted 01/20/2024 for AKI   Significant Labs: White count 6.5, hemoglobin 15.3, platelets 261, lactate 2.4. Troponin 30s to 20s. COVID flu and RSV negative. VBG stable. Creatinine 1.64.   Significant Imaging Studies: CT head shows No acute CT finding. Age related volume loss. Prominence of the lateral and third ventricles, minimally increased since the study of 2023. This may be due to central volume loss, but normal pressure hydrocephalus could be considered CXR shows No active cardiopulmonary disease   Antibiotic Therapy: Anti-infectives (From admission, onward)    Start     Dose/Rate Route Frequency Ordered Stop   01/21/24 1330   ciprofloxacin (CIPRO) IVPB 400 mg  Status:  Discontinued        400 mg 200 mL/hr over 60 Minutes Intravenous Every 24 hours 01/20/24 1615 01/20/24 1621   01/21/24 0200  ciprofloxacin (CIPRO) IVPB 400 mg        400 mg 200 mL/hr over 60 Minutes Intravenous Every 12 hours 01/20/24 1621     01/20/24 2300  ciprofloxacin (CIPRO) IVPB 400 mg  Status:  Discontinued        400 mg 200 mL/hr over 60 Minutes Intravenous Every 12 hours 01/20/24 1605 01/20/24 1615   01/20/24 1215  ciprofloxacin (CIPRO) IVPB 400 mg        400 mg 200 mL/hr over 60 Minutes Intravenous  Once 01/20/24 1204 01/20/24 1425       Procedures:   Consultants:    Hospital Course by Problem: * AKI (acute kidney injury) (HCC) On admission, Cr 1.6 today w/ GFR in the 30s . Baseline Cr 0.7. Suspect prerenal etiology. Will gently hydrate patient. Check FeNa. Hold nephrotoxic agents. Follow. 01-21-2024 AKI has resolved. Today, BUN 22, scr 0.89. stop IVF 01-22-2024 BUN 10, Scr 0.64 today. 01-24-2024 stable. Scr 0.64, BUN 8. Tolerating bactrim.  01-25-2024 resolved   Suspected UTI On admission, Noted worsening lethargy and confusion w/ ? Recent treatment for UTI. Pt does report ? Dysuria and back pain. UA pending. Will empirically cover w/ IV cipro pending urine studies. Otherwise monitor. De-escalate as appropriate. 01-21-2024 have placed call to [facility] SNF to inquire if any urine cx were sent last week when she was treated for 5 days with macrobid. Will need to ask pharmacy about abx choices  given listed "anaphylaxis" to PCN. Will need to investigate if pt has ever had cephalosporins before. Urine cx E. Coli 01-06-2024 Resistant to ampicillin, cipro, levaquin Sensitive to tobra, zosyn, macrobid, bactrim, cefepime, rocephin, invanz, gent, cefazolin. 01-22-2024 after discussion with ID pharmacist. Started 3 days of Bactrim DS to treat E. Coli UTI. Monitor Scr 01-23-2024 continue with bactrim. Monitoring Scr 01-24-2024 Scr  0.64, BUN 8. Tolerating bactrim.  01-25-2024 resolved. Treated with 3 days of PO bactrim.  Gram-positive cocci bacteremia - likley contaminant 01-22-2024 unclear if pt's GPC bacteremia is actually a true infection vs contaminant of blood cx. Will repeat blood cx today. Hold off on starting IV vanco. Discussed with ID pharmacist. Pt is hemodynamically stable. No leukocytosis. No fevers. Culture Results: Lab Results  Component Value Date/Time   SDES  01/20/2024 09:02 PM    BLOOD LEFT WRIST Performed at [facility], [address], [place], [place] [address]    SDES  01/20/2024 10:54 AM    BLOOD RIGHT FOREARM Performed at [facility], [address], [place], [place] [address]    SPECREQUEST  01/20/2024 09:02 PM    BOTTLES DRAWN AEROBIC AND ANAEROBIC Blood Culture adequate volume   SPECREQUEST  01/20/2024 10:54 AM    BOTTLES DRAWN AEROBIC AND ANAEROBIC Blood Culture results may not be optimal due to an inadequate volume of blood received in culture bottles Performed at [facility], [address] [place], [place] [address]    SETUPTIME  01/20/2024 09:02 PM    GRAM POSITIVE COCCI IN BOTH AEROBIC AND ANAEROBIC BOTTLES CRITICAL VALUE NOTED.  VALUE IS CONSISTENT WITH PREVIOUSLY REPORTED AND CALLED VALUE. Performed at [facility], [address] [place], [place] [address]    SETUPTIME  01/20/2024 10:54 AM    Organism ID to follow GRAM POSITIVE COCCI IN BOTH AEROBIC AND ANAEROBIC BOTTLES CRITICAL RESULT CALLED TO, READ BACK BY AND VERIFIED WITH: JASON ROBBINS 01/21/24 0504 MW GRAM STAIN REVIEWED-AGREE WITH RESULT DRT Performed at [facility], [address], [place], [place] [address]    Elpidio Anis POSITIVE COCCI 01/20/2024 09:02 PM   CULT GRAM POSITIVE COCCI 01/20/2024 10:54 AM   REPTSTATUS PENDING 01/20/2024 09:02 PM   REPTSTATUS PENDING 01/20/2024 10:54 AM   01-23-2024 Blood cx grew staph epi and staph hominis from different bottles. I  still think this is a blood cx contaminant.  Repeat blood cx pending. 01-24-2024 repeat blood negative. WBC 3.6 today. Afebrile. I still think her initial blood cx were contaminated since they grew two different organisms.  Staph hominis and staph epi   01-25-2024 repeat blood cx are negative. I think staph epi and staph hominis were both blood culture contaminant.  Acute metabolic encephalopathy On admission, + generalized encephalopathy on presentation. Suspect likely multifactorial w/ contributions of AKI, UTI, chronic dementia. Unclear of baseline. CT head WNL. Monitor 01-21-2024 pt is awake and alert. She knows that she has been living at [facility] for several years now. Knows that she is in the hospital. 01-22-2024 resolved. Pt is AxOx3 now   Hypokalemia 01-21-2024 replete with po kcl 40 meq q4h prn.  01-22-2024 resolved. K 3.6 today  Benign essential HTN On admission, BP stable. Titrate home regimen. 01-21-2024 stable. 01-22-2024 stable 01-23-2024 stable. On norvasc 10 mg daily. Can restart ACEI if BP starts to rise but while on bactrim would be cautious. 01-24-2024 stable. On norvasc 10 mg daily. 01-25-2024 stable. On norvasc 10 mg daily. 01-26-2024 BP has been  stable on norvasc 10 mg daily. Change lasix to 20 mg qday prn fluid/edema. Stop lisinopril.   Class 2 obesity 01-21-2024 BMI 34.23 01-22-2024 stable 01-23-2024 stable 01-25-2024 stable  Hypothyroidism On admission, Cont synthroid. 01-21-2024 continue synthroid 25 mg daily. 01-22-2024 stable 01-23-2024 stable 01-24-2024 stable  01-25-2024 stable    Discharge Diagnoses:  Principal Problem:   AKI (acute kidney injury) (HCC) Active Problems:   Suspected UTI   Hypokalemia   Acute metabolic encephalopathy   Gram-positive cocci bacteremia - likley contaminant   Hypothyroidism   Class 2 obesity   Benign essential HTN   Discharge Instructions  Discharge Instructions     Call MD for:  difficulty  breathing, headache or visual disturbances   Complete by: As directed    Call MD for:  extreme fatigue   Complete by: As directed    Call MD for:  hives   Complete by: As directed    Call MD for:  persistant dizziness or light-headedness   Complete by: As directed    Call MD for:  persistant nausea and vomiting   Complete by: As directed    Call MD for:  redness, tenderness, or signs of infection (pain, swelling, redness, odor or green/yellow discharge around incision site)   Complete by: As directed    Call MD for:  severe uncontrolled pain   Complete by: As directed    Call MD for:  temperature >100.4   Complete by: As directed    Diet - low sodium heart healthy   Complete by: As directed    Discharge instructions   Complete by: As directed    1. Follow up with nursing home provider as needed.   Increase activity slowly   Complete by: As directed       Allergies as of 01/26/2024       Reactions   Other Shortness Of Breath   caffergot   Penicillin G Sodium Anaphylaxis   Aspirin Other (See Comments)   S/p Gastric bypass   Ergotamine Tartrate Other (See Comments)   Ibuprofen Other (See Comments)   S/p gastric bypass   Ketorolac Tromethamine Nausea Only   Naproxen Nausea Only   Nsaids    Other reaction(s): Other (See Comments) GI bleed   Olanzapine Other (See Comments)   Oxaprozin Other (See Comments)   Penicillins    Seroquel [quetiapine Fumarate] Other (See Comments)   hallucinations        Medication List     STOP taking these medications    lisinopril 10 MG tablet Commonly known as: ZESTRIL   nitrofurantoin (macrocrystal-monohydrate) 100 MG capsule Commonly known as: MACROBID   sodium chloride 1 g tablet       TAKE these medications    acetaminophen 325 MG tablet Commonly known as: TYLENOL Take 650 mg by mouth every 6 (six) hours as needed.   acidophilus Caps capsule Take 1 capsule by mouth 2 (two) times daily.   aluminum-magnesium  hydroxide-simethicone 200-200-20 MG/5ML Susp Commonly known as: MAALOX Take 30 mLs by mouth every 6 (six) hours as needed (indigestion).   azelastine 0.05 % ophthalmic solution Commonly known as: OPTIVAR Place 1 drop into both eyes every 12 (twelve) hours.   Creon 24000-76000 units Cpep Generic drug: Pancrelipase (Lip-Prot-Amyl) Take 2 capsules by mouth 3 (three) times daily before meals.   cyanocobalamin 1000 MCG/ML injection Commonly known as: VITAMIN B12 Inject 1 mL into the skin every 14 (fourteen) days.   Depo-Estradiol 5 MG/ML injection Generic drug: estradiol  cypionate Inject 0.4 mg into the muscle every 28 (twenty-eight) days.   divalproex 250 MG DR tablet Commonly known as: DEPAKOTE Take 250 mg by mouth 2 (two) times daily.   ferrous gluconate 324 MG tablet Commonly known as: FERGON Take 1 tablet (324 mg total) by mouth daily with breakfast.   fluticasone 50 MCG/ACT nasal spray Commonly known as: FLONASE Place 1 spray into both nostrils daily as needed for allergies or rhinitis.   folic acid 1 MG tablet Commonly known as: FOLVITE Take 1 mg by mouth daily.   furosemide 20 MG tablet Commonly known as: LASIX Take 1 tablet (20 mg total) by mouth daily as needed for fluid or edema. What changed:  when to take this reasons to take this   gabapentin 100 MG capsule Commonly known as: NEURONTIN Take 1 capsule (100 mg total) by mouth at bedtime as needed (sleep). What changed: reasons to take this   ketoconazole 2 % cream Commonly known as: NIZORAL Apply 1 Application topically every 12 (twelve) hours.   lactase 3000 units tablet Commonly known as: LACTAID Take 3,000 Units by mouth 3 (three) times daily with meals.   levothyroxine 25 MCG tablet Commonly known as: SYNTHROID Take 25 mcg by mouth daily.   loperamide 2 MG capsule Commonly known as: IMODIUM Take 1 capsule (2 mg total) by mouth every 2 (two) hours as needed for diarrhea or loose stools.    LORazepam 1 MG tablet Commonly known as: ATIVAN Take 0.5 tablets (0.5 mg total) by mouth daily as needed for anxiety. What changed:  how much to take when to take this reasons to take this   melatonin 5 MG Tabs Take 10 mg by mouth at bedtime.   ondansetron 4 MG tablet Commonly known as: ZOFRAN Take 4 mg by mouth every 8 (eight) hours as needed.   oxyCODONE-acetaminophen 5-325 MG tablet Commonly known as: PERCOCET/ROXICET Take 1 tablet by mouth 2 (two) times daily as needed.   polyethylene glycol 17 g packet Commonly known as: MIRALAX / GLYCOLAX Take 17 g by mouth daily as needed.   risperiDONE 1 MG tablet Commonly known as: RISPERDAL Take 1-2 mg by mouth daily. Takes one 1mg  tablet and one 0.5mg  tablet together to equal 1.5mg  daily Takes one 2mg  tablet at bedtime   simethicone 80 MG chewable tablet Commonly known as: MYLICON Chew 80 mg by mouth every 6 (six) hours as needed for flatulence.   traZODone 50 MG tablet Commonly known as: DESYREL Take 0.5 tablets (25 mg total) by mouth at bedtime as needed for sleep.   venlafaxine XR 150 MG 24 hr capsule Commonly known as: EFFEXOR-XR Take 150 mg by mouth at bedtime.        Contact information for after-discharge care     Destination     [facility] .   Service: Skilled Nursing Contact information: [address] [place] [address] [phone]                    Allergies  Allergen Reactions   Other Shortness Of Breath    caffergot   Penicillin G Sodium Anaphylaxis   Aspirin Other (See Comments)    S/p Gastric bypass   Ergotamine Tartrate Other (See Comments)   Ibuprofen Other (See Comments)    S/p gastric bypass   Ketorolac Tromethamine Nausea Only   Naproxen Nausea Only   Nsaids     Other reaction(s): Other (See Comments) GI bleed   Olanzapine  Other (See Comments)   Oxaprozin Other (See Comments)   Penicillins    Seroquel [Quetiapine Fumarate] Other  (See Comments)    hallucinations    Discharge Exam: Vitals:   01/26/24 0357 01/26/24 0832  BP: 129/72 137/67  Pulse: 73 82  Resp: 20 16  Temp: 98.1 F (36.7 C) 98.1 F (36.7 C)  SpO2: 95% 95%    Physical Exam Vitals and nursing note reviewed.  Constitutional:      General: She is not in acute distress.    Appearance: She is not toxic-appearing.  HENT:     Head: Normocephalic and atraumatic.     Nose: Nose normal.  Cardiovascular:     Rate and Rhythm: Normal rate and regular rhythm.  Pulmonary:     Effort: Pulmonary effort is normal.     Breath sounds: Normal breath sounds.  Abdominal:     General: Bowel sounds are normal. There is no distension.     Palpations: Abdomen is soft.  Musculoskeletal:     Right lower leg: No edema.     Left lower leg: No edema.  Skin:    General: Skin is warm and dry.     Capillary Refill: Capillary refill takes less than 2 seconds.  Neurological:     Mental Status: She is alert.     The results of significant diagnostics from this hospitalization (including imaging, microbiology, ancillary and laboratory) are listed below for reference.    Microbiology: Recent Results (from the past 240 hours)  Blood culture (routine x 2)     Status: Abnormal   Collection Time: 01/20/24 10:54 AM   Specimen: BLOOD RIGHT FOREARM  Result Value Ref Range Status   Specimen Description   Final    BLOOD RIGHT FOREARM Performed at [facility], [address], [place], [place] [address]    Special Requests   Final    BOTTLES DRAWN AEROBIC AND ANAEROBIC Blood Culture results may not be optimal due to an inadequate volume of blood received in culture bottles Performed at [facility], [address] [place], [place] [address]    Culture  Setup Time   Final    Organism ID to follow GRAM POSITIVE COCCI IN BOTH AEROBIC AND ANAEROBIC BOTTLES CRITICAL RESULT CALLED TO, READ BACK BY AND VERIFIED WITH: JASON ROBBINS 01/21/24 0504 MW GRAM  STAIN REVIEWED-AGREE WITH RESULT DRT Performed at [facility], [address], [place], [place] [address]    Culture (A)  Final    STAPHYLOCOCCUS EPIDERMIDIS THE SIGNIFICANCE OF ISOLATING THIS ORGANISM FROM A SINGLE SET OF BLOOD CULTURES WHEN MULTIPLE SETS ARE DRAWN IS UNCERTAIN. PLEASE NOTIFY THE MICROBIOLOGY DEPARTMENT WITHIN ONE WEEK IF SPECIATION AND SENSITIVITIES ARE REQUIRED. STAPHYLOCOCCUS HOMINIS    Report Status 01/23/2024 FINAL  Final   Organism ID, Bacteria STAPHYLOCOCCUS HOMINIS  Final      Susceptibility   Staphylococcus hominis - MIC*    CIPROFLOXACIN <=0.5 SENSITIVE Sensitive     ERYTHROMYCIN <=0.25 SENSITIVE Sensitive     GENTAMICIN <=0.5 SENSITIVE Sensitive     OXACILLIN <=0.25 SENSITIVE Sensitive     TETRACYCLINE 2 SENSITIVE Sensitive     VANCOMYCIN <=0.5 SENSITIVE Sensitive     TRIMETH/SULFA <=10 SENSITIVE Sensitive     CLINDAMYCIN <=0.25 SENSITIVE Sensitive     RIFAMPIN <=0.5 SENSITIVE Sensitive     Inducible Clindamycin NEGATIVE Sensitive     * STAPHYLOCOCCUS HOMINIS  Blood Culture ID Panel (Reflexed)     Status: Abnormal  Collection Time: 01/20/24 10:54 AM  Result Value Ref Range Status   Enterococcus faecalis NOT DETECTED NOT DETECTED Final   Enterococcus Faecium NOT DETECTED NOT DETECTED Final   Listeria monocytogenes NOT DETECTED NOT DETECTED Final   Staphylococcus species DETECTED (A) NOT DETECTED Final    Comment: CRITICAL RESULT CALLED TO, READ BACK BY AND VERIFIED WITH: JASON ROBBINS 01/21/24 0504 MW    Staphylococcus aureus (BCID) NOT DETECTED NOT DETECTED Final   Staphylococcus epidermidis NOT DETECTED NOT DETECTED Final   Staphylococcus lugdunensis NOT DETECTED NOT DETECTED Final   Streptococcus species NOT DETECTED NOT DETECTED Final   Streptococcus agalactiae NOT DETECTED NOT DETECTED Final   Streptococcus pneumoniae NOT DETECTED NOT DETECTED Final   Streptococcus pyogenes NOT DETECTED NOT DETECTED Final   A.calcoaceticus-baumannii NOT  DETECTED NOT DETECTED Final   Bacteroides fragilis NOT DETECTED NOT DETECTED Final   Enterobacterales NOT DETECTED NOT DETECTED Final   Enterobacter cloacae complex NOT DETECTED NOT DETECTED Final   Escherichia coli NOT DETECTED NOT DETECTED Final   Klebsiella aerogenes NOT DETECTED NOT DETECTED Final   Klebsiella oxytoca NOT DETECTED NOT DETECTED Final   Klebsiella pneumoniae NOT DETECTED NOT DETECTED Final   Proteus species NOT DETECTED NOT DETECTED Final   Salmonella species NOT DETECTED NOT DETECTED Final   Serratia marcescens NOT DETECTED NOT DETECTED Final   Haemophilus influenzae NOT DETECTED NOT DETECTED Final   Neisseria meningitidis NOT DETECTED NOT DETECTED Final   Pseudomonas aeruginosa NOT DETECTED NOT DETECTED Final   Stenotrophomonas maltophilia NOT DETECTED NOT DETECTED Final   Candida albicans NOT DETECTED NOT DETECTED Final   Candida auris NOT DETECTED NOT DETECTED Final   Candida glabrata NOT DETECTED NOT DETECTED Final   Candida krusei NOT DETECTED NOT DETECTED Final   Candida parapsilosis NOT DETECTED NOT DETECTED Final   Candida tropicalis NOT DETECTED NOT DETECTED Final   Cryptococcus neoformans/gattii NOT DETECTED NOT DETECTED Final    Comment: Performed at [facility], [address] [place], [place] [address]  Resp panel by RT-PCR (RSV, Flu A&B, Covid) Anterior Nasal Swab     Status: None   Collection Time: 01/20/24 11:00 AM   Specimen: Anterior Nasal Swab  Result Value Ref Range Status   SARS Coronavirus 2 by RT PCR NEGATIVE NEGATIVE Final    Comment: (NOTE) SARS-CoV-2 target nucleic acids are NOT DETECTED.  The SARS-CoV-2 RNA is generally detectable in upper respiratory specimens during the acute phase of infection. The lowest concentration of SARS-CoV-2 viral copies this assay can detect is 138 copies/mL. A negative result does not preclude SARS-Cov-2 infection and should not be used as the sole basis for treatment or other patient  management decisions. A negative result may occur with  improper specimen collection/handling, submission of specimen other than nasopharyngeal swab, presence of viral mutation(s) within the areas targeted by this assay, and inadequate number of viral copies(<138 copies/mL). A negative result must be combined with clinical observations, patient history, and epidemiological information. The expected result is Negative.  Fact Sheet for Patients:  BloggerCourse.com  Fact Sheet for Healthcare Providers:  SeriousBroker.it  This test is no t yet approved or cleared by the [place] FDA and  has been authorized for detection and/or diagnosis of SARS-CoV-2 by FDA under an Emergency Use Authorization (EUA). This EUA will remain  in effect (meaning this test can be used) for the duration of the COVID-19 declaration under Section 564(b)(1) of the Act, 21 U.S.C.section 360bbb-3(b)(1), unless the authorization is terminated  or  revoked sooner.       Influenza A by PCR NEGATIVE NEGATIVE Final   Influenza B by PCR NEGATIVE NEGATIVE Final    Comment: (NOTE) The Xpert Xpress SARS-CoV-2/FLU/RSV plus assay is intended as an aid in the diagnosis of influenza from Nasopharyngeal swab specimens and should not be used as a sole basis for treatment. Nasal washings and aspirates are unacceptable for Xpert Xpress SARS-CoV-2/FLU/RSV testing.  Fact Sheet for Patients: BloggerCourse.com  Fact Sheet for Healthcare Providers: SeriousBroker.it  This test is not yet approved or cleared by the [place] FDA and has been authorized for detection and/or diagnosis of SARS-CoV-2 by FDA under an Emergency Use Authorization (EUA). This EUA will remain in effect (meaning this test can be used) for the duration of the COVID-19 declaration under Section 564(b)(1) of the Act, 21 U.S.C. section 360bbb-3(b)(1),  unless the authorization is terminated or revoked.     Resp Syncytial Virus by PCR NEGATIVE NEGATIVE Final    Comment: (NOTE) Fact Sheet for Patients: BloggerCourse.com  Fact Sheet for Healthcare Providers: SeriousBroker.it  This test is not yet approved or cleared by the [place] FDA and has been authorized for detection and/or diagnosis of SARS-CoV-2 by FDA under an Emergency Use Authorization (EUA). This EUA will remain in effect (meaning this test can be used) for the duration of the COVID-19 declaration under Section 564(b)(1) of the Act, 21 U.S.C. section 360bbb-3(b)(1), unless the authorization is terminated or revoked.  Performed at [facility], [address] [place], [place] [address]   Blood culture (routine x 2)     Status: Abnormal   Collection Time: 01/20/24  9:02 PM   Specimen: BLOOD LEFT WRIST  Result Value Ref Range Status   Specimen Description   Final    BLOOD LEFT WRIST Performed at [facility], [address] [place], [place] [address]    Special Requests   Final    BOTTLES DRAWN AEROBIC AND ANAEROBIC Blood Culture adequate volume Performed at [facility], [address] [place], [place] [address]    Culture  Setup Time   Final    GRAM POSITIVE COCCI IN BOTH AEROBIC AND ANAEROBIC BOTTLES CRITICAL VALUE NOTED.  VALUE IS CONSISTENT WITH PREVIOUSLY REPORTED AND CALLED VALUE. Performed at [facility], [address] [place], [place] [address]    Culture (A)  Final    STAPHYLOCOCCUS HOMINIS STAPHYLOCOCCUS EPIDERMIDIS    Report Status 01/25/2024 FINAL  Final   Organism ID, Bacteria STAPHYLOCOCCUS HOMINIS  Final   Organism ID, Bacteria STAPHYLOCOCCUS EPIDERMIDIS  Final      Susceptibility   Staphylococcus epidermidis - MIC*    CIPROFLOXACIN >=8 RESISTANT Resistant     ERYTHROMYCIN >=8 RESISTANT Resistant     GENTAMICIN <=0.5 SENSITIVE Sensitive      OXACILLIN >=4 RESISTANT Resistant     TETRACYCLINE <=1 SENSITIVE Sensitive     VANCOMYCIN 2 SENSITIVE Sensitive     TRIMETH/SULFA 20 SENSITIVE Sensitive     CLINDAMYCIN RESISTANT Resistant     RIFAMPIN <=0.5 SENSITIVE Sensitive     Inducible Clindamycin POSITIVE Resistant     * STAPHYLOCOCCUS EPIDERMIDIS   Staphylococcus hominis - MIC*    CIPROFLOXACIN 4 RESISTANT Resistant     ERYTHROMYCIN RESISTANT Resistant     GENTAMICIN <=0.5 SENSITIVE Sensitive     OXACILLIN >=4 RESISTANT Resistant     TETRACYCLINE <=1 SENSITIVE Sensitive     VANCOMYCIN 1 SENSITIVE Sensitive     TRIMETH/SULFA <=10 SENSITIVE Sensitive  CLINDAMYCIN RESISTANT Resistant     RIFAMPIN <=0.5 SENSITIVE Sensitive     Inducible Clindamycin POSITIVE Resistant     * STAPHYLOCOCCUS HOMINIS  Culture, blood (Routine X 2) w Reflex to ID Panel     Status: None (Preliminary result)   Collection Time: 01/22/24 10:09 AM   Specimen: BLOOD  Result Value Ref Range Status   Specimen Description BLOOD BLOOD LEFT HAND  Final   Special Requests   Final    BOTTLES DRAWN AEROBIC AND ANAEROBIC Blood Culture results may not be optimal due to an inadequate volume of blood received in culture bottles   Culture   Final    NO GROWTH 4 DAYS Performed at [facility], [address] [place], [place] [address]    Report Status PENDING  Incomplete  Culture, blood (Routine X 2) w Reflex to ID Panel     Status: None (Preliminary result)   Collection Time: 01/22/24 10:16 AM   Specimen: BLOOD  Result Value Ref Range Status   Specimen Description BLOOD BLOOD RIGHT HAND  Final   Special Requests   Final    BOTTLES DRAWN AEROBIC AND ANAEROBIC Blood Culture results may not be optimal due to an inadequate volume of blood received in culture bottles   Culture   Final    NO GROWTH 4 DAYS Performed at [facility], [address] [place], [place] [address]    Report Status PENDING  Incomplete     Labs: BNP (last 3  results) Recent Labs    01/20/24 0903  BNP 25.8   Basic Metabolic Panel: Recent Labs  Lab 01/20/24 0903 01/21/24 0455 01/22/24 0702 01/24/24 0606  NA 138 137 138 137  K 3.1* 3.1* 3.6 3.5  CL 100 101 104 102  CO2 25 25 24 24   GLUCOSE 99 114* 88 78  BUN 30* 22 10 8   CREATININE 1.64* 0.89 0.64 0.64  CALCIUM 8.6* 8.4* 8.3* 8.5*   Liver Function Tests: Recent Labs  Lab 01/20/24 0903 01/21/24 0455 01/22/24 0702 01/24/24 0606  AST 23 26 18  33  ALT 18 21 17 29   ALKPHOS 76 70 64 62  BILITOT 0.5 0.4 0.6 0.6  PROT 6.8 6.2* 5.5* 5.7*  ALBUMIN 3.3* 2.9* 2.7* 2.7*    Recent Labs  Lab 01/20/24 1020  AMMONIA 17   CBC: Recent Labs  Lab 01/20/24 0903 01/21/24 0455 01/22/24 0702 01/24/24 0606  WBC 6.5 6.1 4.1 3.6*  NEUTROABS  --   --  2.2 1.5*  HGB 15.3* 14.1 13.0 12.9  HCT 45.6 42.2 38.1 38.7  MCV 90.5 91.9 89.0 91.1  PLT 261 195 205 212   BNP: Recent Labs  Lab 01/20/24 0903  BNP 25.8   CBG: Recent Labs  Lab 01/23/24 1209  GLUCAP 105*   Urinalysis    Component Value Date/Time   COLORURINE YELLOW (A) 01/20/2024 1540   APPEARANCEUR CLOUDY (A) 01/20/2024 1540   LABSPEC 1.018 01/20/2024 1540   PHURINE 5.0 01/20/2024 1540   GLUCOSEU NEGATIVE 01/20/2024 1540   HGBUR SMALL (A) 01/20/2024 1540   BILIRUBINUR NEGATIVE 01/20/2024 1540   KETONESUR NEGATIVE 01/20/2024 1540   PROTEINUR NEGATIVE 01/20/2024 1540   NITRITE POSITIVE (A) 01/20/2024 1540   LEUKOCYTESUR TRACE (A) 01/20/2024 1540   Sepsis Labs Recent Labs  Lab 01/20/24 0903 01/21/24 0455 01/22/24 0702 01/24/24 0606  WBC 6.5 6.1 4.1 3.6*   Microbiology Recent Results (from the past 240 hours)  Blood culture (routine x 2)  Status: Abnormal   Collection Time: 01/20/24 10:54 AM   Specimen: BLOOD RIGHT FOREARM  Result Value Ref Range Status   Specimen Description   Final    BLOOD RIGHT FOREARM Performed at [facility], [address] [place], [place] [address]    Special Requests    Final    BOTTLES DRAWN AEROBIC AND ANAEROBIC Blood Culture results may not be optimal due to an inadequate volume of blood received in culture bottles Performed at [facility], [address] [place], [place] [address]    Culture  Setup Time   Final    Organism ID to follow GRAM POSITIVE COCCI IN BOTH AEROBIC AND ANAEROBIC BOTTLES CRITICAL RESULT CALLED TO, READ BACK BY AND VERIFIED WITH: JASON ROBBINS 01/21/24 0504 MW GRAM STAIN REVIEWED-AGREE WITH RESULT DRT Performed at [facility], [address] [place], [place] [address]    Culture (A)  Final    STAPHYLOCOCCUS EPIDERMIDIS THE SIGNIFICANCE OF ISOLATING THIS ORGANISM FROM A SINGLE SET OF BLOOD CULTURES WHEN MULTIPLE SETS ARE DRAWN IS UNCERTAIN. PLEASE NOTIFY THE MICROBIOLOGY DEPARTMENT WITHIN ONE WEEK IF SPECIATION AND SENSITIVITIES ARE REQUIRED. STAPHYLOCOCCUS HOMINIS    Report Status 01/23/2024 FINAL  Final   Organism ID, Bacteria STAPHYLOCOCCUS HOMINIS  Final      Susceptibility   Staphylococcus hominis - MIC*    CIPROFLOXACIN <=0.5 SENSITIVE Sensitive     ERYTHROMYCIN <=0.25 SENSITIVE Sensitive     GENTAMICIN <=0.5 SENSITIVE Sensitive     OXACILLIN <=0.25 SENSITIVE Sensitive     TETRACYCLINE 2 SENSITIVE Sensitive     VANCOMYCIN <=0.5 SENSITIVE Sensitive     TRIMETH/SULFA <=10 SENSITIVE Sensitive     CLINDAMYCIN <=0.25 SENSITIVE Sensitive     RIFAMPIN <=0.5 SENSITIVE Sensitive     Inducible Clindamycin NEGATIVE Sensitive     * STAPHYLOCOCCUS HOMINIS  Blood Culture ID Panel (Reflexed)     Status: Abnormal   Collection Time: 01/20/24 10:54 AM  Result Value Ref Range Status   Enterococcus faecalis NOT DETECTED NOT DETECTED Final   Enterococcus Faecium NOT DETECTED NOT DETECTED Final   Listeria monocytogenes NOT DETECTED NOT DETECTED Final   Staphylococcus species DETECTED (A) NOT DETECTED Final    Comment: CRITICAL RESULT CALLED TO, READ BACK BY AND VERIFIED WITH: JASON ROBBINS 01/21/24 0504 MW     Staphylococcus aureus (BCID) NOT DETECTED NOT DETECTED Final   Staphylococcus epidermidis NOT DETECTED NOT DETECTED Final   Staphylococcus lugdunensis NOT DETECTED NOT DETECTED Final   Streptococcus species NOT DETECTED NOT DETECTED Final   Streptococcus agalactiae NOT DETECTED NOT DETECTED Final   Streptococcus pneumoniae NOT DETECTED NOT DETECTED Final   Streptococcus pyogenes NOT DETECTED NOT DETECTED Final   A.calcoaceticus-baumannii NOT DETECTED NOT DETECTED Final   Bacteroides fragilis NOT DETECTED NOT DETECTED Final   Enterobacterales NOT DETECTED NOT DETECTED Final   Enterobacter cloacae complex NOT DETECTED NOT DETECTED Final   Escherichia coli NOT DETECTED NOT DETECTED Final   Klebsiella aerogenes NOT DETECTED NOT DETECTED Final   Klebsiella oxytoca NOT DETECTED NOT DETECTED Final   Klebsiella pneumoniae NOT DETECTED NOT DETECTED Final   Proteus species NOT DETECTED NOT DETECTED Final   Salmonella species NOT DETECTED NOT DETECTED Final   Serratia marcescens NOT DETECTED NOT DETECTED Final   Haemophilus influenzae NOT DETECTED NOT DETECTED Final   Neisseria meningitidis NOT DETECTED NOT DETECTED Final   Pseudomonas aeruginosa NOT DETECTED NOT DETECTED Final   Stenotrophomonas maltophilia NOT DETECTED NOT DETECTED Final  Candida albicans NOT DETECTED NOT DETECTED Final   Candida auris NOT DETECTED NOT DETECTED Final   Candida glabrata NOT DETECTED NOT DETECTED Final   Candida krusei NOT DETECTED NOT DETECTED Final   Candida parapsilosis NOT DETECTED NOT DETECTED Final   Candida tropicalis NOT DETECTED NOT DETECTED Final   Cryptococcus neoformans/gattii NOT DETECTED NOT DETECTED Final    Comment: Performed at [facility], [address] [place], [place] [address]  Resp panel by RT-PCR (RSV, Flu A&B, Covid) Anterior Nasal Swab     Status: None   Collection Time: 01/20/24 11:00 AM   Specimen: Anterior Nasal Swab  Result Value Ref Range Status   SARS Coronavirus 2  by RT PCR NEGATIVE NEGATIVE Final    Comment: (NOTE) SARS-CoV-2 target nucleic acids are NOT DETECTED.  The SARS-CoV-2 RNA is generally detectable in upper respiratory specimens during the acute phase of infection. The lowest concentration of SARS-CoV-2 viral copies this assay can detect is 138 copies/mL. A negative result does not preclude SARS-Cov-2 infection and should not be used as the sole basis for treatment or other patient management decisions. A negative result may occur with  improper specimen collection/handling, submission of specimen other than nasopharyngeal swab, presence of viral mutation(s) within the areas targeted by this assay, and inadequate number of viral copies(<138 copies/mL). A negative result must be combined with clinical observations, patient history, and epidemiological information. The expected result is Negative.  Fact Sheet for Patients:  BloggerCourse.com  Fact Sheet for Healthcare Providers:  SeriousBroker.it  This test is no t yet approved or cleared by the [place] FDA and  has been authorized for detection and/or diagnosis of SARS-CoV-2 by FDA under an Emergency Use Authorization (EUA). This EUA will remain  in effect (meaning this test can be used) for the duration of the COVID-19 declaration under Section 564(b)(1) of the Act, 21 U.S.C.section 360bbb-3(b)(1), unless the authorization is terminated  or revoked sooner.       Influenza A by PCR NEGATIVE NEGATIVE Final   Influenza B by PCR NEGATIVE NEGATIVE Final    Comment: (NOTE) The Xpert Xpress SARS-CoV-2/FLU/RSV plus assay is intended as an aid in the diagnosis of influenza from Nasopharyngeal swab specimens and should not be used as a sole basis for treatment. Nasal washings and aspirates are unacceptable for Xpert Xpress SARS-CoV-2/FLU/RSV testing.  Fact Sheet for Patients: BloggerCourse.com  Fact  Sheet for Healthcare Providers: SeriousBroker.it  This test is not yet approved or cleared by the [place] FDA and has been authorized for detection and/or diagnosis of SARS-CoV-2 by FDA under an Emergency Use Authorization (EUA). This EUA will remain in effect (meaning this test can be used) for the duration of the COVID-19 declaration under Section 564(b)(1) of the Act, 21 U.S.C. section 360bbb-3(b)(1), unless the authorization is terminated or revoked.     Resp Syncytial Virus by PCR NEGATIVE NEGATIVE Final    Comment: (NOTE) Fact Sheet for Patients: BloggerCourse.com  Fact Sheet for Healthcare Providers: SeriousBroker.it  This test is not yet approved or cleared by the [place] FDA and has been authorized for detection and/or diagnosis of SARS-CoV-2 by FDA under an Emergency Use Authorization (EUA). This EUA will remain in effect (meaning this test can be used) for the duration of the COVID-19 declaration under Section 564(b)(1) of the Act, 21 U.S.C. section 360bbb-3(b)(1), unless the authorization is terminated or revoked.  Performed at [name], [address] [place], [place] [address]   Blood culture (routine  x 2)     Status: Abnormal   Collection Time: 01/20/24  9:02 PM   Specimen: BLOOD LEFT WRIST  Result Value Ref Range Status   Specimen Description   Final    BLOOD LEFT WRIST Performed at [facility], [address], [place], [place] [address]    Special Requests   Final    BOTTLES DRAWN AEROBIC AND ANAEROBIC Blood Culture adequate volume Performed at [facility], [address] [place], [place] [address]    Culture  Setup Time   Final    GRAM POSITIVE COCCI IN BOTH AEROBIC AND ANAEROBIC BOTTLES CRITICAL VALUE NOTED.  VALUE IS CONSISTENT WITH PREVIOUSLY REPORTED AND CALLED VALUE. Performed at [facility], [address]  [place], [place] [address]    Culture (A)  Final    STAPHYLOCOCCUS HOMINIS STAPHYLOCOCCUS EPIDERMIDIS    Report Status 01/25/2024 FINAL  Final   Organism ID, Bacteria STAPHYLOCOCCUS HOMINIS  Final   Organism ID, Bacteria STAPHYLOCOCCUS EPIDERMIDIS  Final      Susceptibility   Staphylococcus epidermidis - MIC*    CIPROFLOXACIN >=8 RESISTANT Resistant     ERYTHROMYCIN >=8 RESISTANT Resistant     GENTAMICIN <=0.5 SENSITIVE Sensitive     OXACILLIN >=4 RESISTANT Resistant     TETRACYCLINE <=1 SENSITIVE Sensitive     VANCOMYCIN 2 SENSITIVE Sensitive     TRIMETH/SULFA 20 SENSITIVE Sensitive     CLINDAMYCIN RESISTANT Resistant     RIFAMPIN <=0.5 SENSITIVE Sensitive     Inducible Clindamycin POSITIVE Resistant     * STAPHYLOCOCCUS EPIDERMIDIS   Staphylococcus hominis - MIC*    CIPROFLOXACIN 4 RESISTANT Resistant     ERYTHROMYCIN RESISTANT Resistant     GENTAMICIN <=0.5 SENSITIVE Sensitive     OXACILLIN >=4 RESISTANT Resistant     TETRACYCLINE <=1 SENSITIVE Sensitive     VANCOMYCIN 1 SENSITIVE Sensitive     TRIMETH/SULFA <=10 SENSITIVE Sensitive     CLINDAMYCIN RESISTANT Resistant     RIFAMPIN <=0.5 SENSITIVE Sensitive     Inducible Clindamycin POSITIVE Resistant     * STAPHYLOCOCCUS HOMINIS  Culture, blood (Routine X 2) w Reflex to ID Panel     Status: None (Preliminary result)   Collection Time: 01/22/24 10:09 AM   Specimen: BLOOD  Result Value Ref Range Status   Specimen Description BLOOD BLOOD LEFT HAND  Final   Special Requests   Final    BOTTLES DRAWN AEROBIC AND ANAEROBIC Blood Culture results may not be optimal due to an inadequate volume of blood received in culture bottles   Culture   Final    NO GROWTH 4 DAYS Performed at [facility], [address] [place], [place] [address]    Report Status PENDING  Incomplete  Culture, blood (Routine X 2) w Reflex to ID Panel     Status: None (Preliminary result)   Collection Time: 01/22/24 10:16 AM   Specimen: BLOOD  Result  Value Ref Range Status   Specimen Description BLOOD BLOOD RIGHT HAND  Final   Special Requests   Final    BOTTLES DRAWN AEROBIC AND ANAEROBIC Blood Culture results may not be optimal due to an inadequate volume of blood received in culture bottles   Culture   Final    NO GROWTH 4 DAYS Performed at [facility], [address] [place], [place] [address]    Report Status PENDING  Incomplete    Procedures/Studies: DG Chest 2 View Result Date: 01/20/2024 CLINICAL DATA:  Altered mental status EXAM: CHEST - 2 VIEW COMPARISON:  02/16/2022 FINDINGS: Stable cardiomediastinal contours. Aortic atherosclerosis. No focal airspace consolidation, pleural effusion, or pneumothorax. IMPRESSION: No active cardiopulmonary disease. Electronically Signed   By: Duanne Guess D.O.   On: 01/20/2024 11:14   CT Head Wo Contrast Result Date: 01/20/2024 CLINICAL DATA:  Mental status change of unknown cause.  Somnolent. EXAM: CT HEAD WITHOUT CONTRAST TECHNIQUE: Contiguous axial images were obtained from the base of the skull through the vertex without intravenous contrast. RADIATION DOSE REDUCTION: This exam was performed according to the departmental dose-optimization program which includes automated exposure control, adjustment of the mA and/or kV according to patient size and/or use of iterative reconstruction technique. COMPARISON:  01/29/2022 FINDINGS: Brain: Age related volume loss. No sign of old or acute focal infarction, mass, hemorrhage or extra-axial collection. There is prominence of the lateral and third ventricles, minimally increased since the study of 2023. This may be due to central volume loss, but normal pressure hydrocephalus could be considered. Vascular: There is atherosclerotic calcification of the major vessels at the base of the brain. Skull: Negative Sinuses/Orbits: Clear/normal Other: None IMPRESSION: No acute CT finding. Age related volume loss. Prominence of the lateral and third  ventricles, minimally increased since the study of 2023. This may be due to central volume loss, but normal pressure hydrocephalus could be considered. Electronically Signed   By: Paulina Fusi M.D.   On: 01/20/2024 09:58    Time coordinating discharge: 45 mins  SIGNED:  Carollee Herter, DO [facility] 01/26/24, 10:12 AM

## 2024-01-26 NOTE — Progress Notes (Incomplete)
Report called to Therapist, sports at  [facility]. All discharge instructions/prescriptions place in discharge packet. IV removed with no complications noted. Pt will discharge via EMS. Family made aware. Pt sitting in bed watching TV.  All safety precautions maintained. Ladonna Snide RN

## 2024-01-26 NOTE — Progress Notes (Signed)
PROGRESS NOTE    Jacqueline Williams  VWU:[id number] DOB: August 03, 1951 DOA: 01/20/2024 PCP: Melonie Florida, FNP  Subjective: Pt seen and examined. Stable. Ready for DC.   Hospital Course: HPI: Jacqueline Williams is a 73 y.o. female with medical history significant of HTN, chronic pain syndrome, GERD, neuropathy, GAD, acquired scoliosis, obesity, history of neurogenic claudication due to lumbar spinal stenosis, severe depression, insomnia presenting with encephalopathy, AKI, UTI.  Limited history in the setting of encephalopathy.  Per report, patient sent from [facility] with concern for lethargy and mild confusion.  Some concern for?  CVA.  Also noted with recent UTI treatment though antibiotic regimen not specified.  No reported fevers or chills.  No reported chest pain or belly pain.  Mild back pain and?  Dysuria per patient.  No focal hemiparesis or confusion.   Significant Events: Admitted 01/20/2024 for AKI   Significant Labs: White count 6.5, hemoglobin 15.3, platelets 261, lactate 2.4. Troponin 30s to 20s. COVID flu and RSV negative. VBG stable. Creatinine 1.64.   Significant Imaging Studies: CT head shows No acute CT finding. Age related volume loss. Prominence of the lateral and third ventricles, minimally increased since the study of 2023. This may be due to central volume loss, but normal pressure hydrocephalus could be considered CXR shows No active cardiopulmonary disease   Antibiotic Therapy: Anti-infectives (From admission, onward)    Start     Dose/Rate Route Frequency Ordered Stop   01/21/24 1330  ciprofloxacin (CIPRO) IVPB 400 mg  Status:  Discontinued        400 mg 200 mL/hr over 60 Minutes Intravenous Every 24 hours 01/20/24 1615 01/20/24 1621   01/21/24 0200  ciprofloxacin (CIPRO) IVPB 400 mg        400 mg 200 mL/hr over 60 Minutes Intravenous Every 12 hours 01/20/24 1621     01/20/24 2300  ciprofloxacin (CIPRO) IVPB 400 mg  Status:  Discontinued        400 mg 200  mL/hr over 60 Minutes Intravenous Every 12 hours 01/20/24 1605 01/20/24 1615   01/20/24 1215  ciprofloxacin (CIPRO) IVPB 400 mg        400 mg 200 mL/hr over 60 Minutes Intravenous  Once 01/20/24 1204 01/20/24 1425       Procedures:   Consultants:     Assessment and Plan: * AKI (acute kidney injury) (HCC) On admission, Cr 1.6 today w/ GFR in the 30s . Baseline Cr 0.7. Suspect prerenal etiology. Will gently hydrate patient. Check FeNa. Hold nephrotoxic agents. Follow. 01-21-2024 AKI has resolved. Today, BUN 22, scr 0.89. stop IVF 01-22-2024 BUN 10, Scr 0.64 today. 01-24-2024 stable. Scr 0.64, BUN 8. Tolerating bactrim.  01-25-2024 resolved   Suspected UTI On admission, Noted worsening lethargy and confusion w/ ? Recent treatment for UTI. Pt does report ? Dysuria and back pain. UA pending. Will empirically cover w/ IV cipro pending urine studies. Otherwise monitor. De-escalate as appropriate. 01-21-2024 have placed call to [facility] to inquire if any urine cx were sent last week when she was treated for 5 days with macrobid. Will need to ask pharmacy about abx choices given listed "anaphylaxis" to PCN. Will need to investigate if pt has ever had cephalosporins before. Urine cx E. Coli 01-06-2024 Resistant to ampicillin, cipro, levaquin Sensitive to tobra, zosyn, macrobid, bactrim, cefepime, rocephin, invanz, gent, cefazolin. 01-22-2024 after discussion with ID pharmacist. Started 3 days of Bactrim DS to treat E. Coli UTI. Monitor Scr 01-23-2024 continue with bactrim.  Monitoring Scr 01-24-2024 Scr 0.64, BUN 8. Tolerating bactrim.  01-25-2024 resolved. Treated with 3 days of PO bactrim.  Gram-positive cocci bacteremia 01-22-2024 unclear if pt's GPC bacteremia is actually a true infection vs contaminant of blood cx. Will repeat blood cx today. Hold off on starting IV vanco. Discussed with ID pharmacist. Pt is hemodynamically stable. No leukocytosis. No fevers. Culture Results: Lab  Results  Component Value Date/Time   SDES  01/20/2024 09:02 PM    BLOOD LEFT WRIST Performed at [facility], [address], [place], [place] [address]    SDES  01/20/2024 10:54 AM    BLOOD RIGHT FOREARM Performed at [facility], [address] [place], [place] [address]    SPECREQUEST  01/20/2024 09:02 PM    BOTTLES DRAWN AEROBIC AND ANAEROBIC Blood Culture adequate volume   SPECREQUEST  01/20/2024 10:54 AM    BOTTLES DRAWN AEROBIC AND ANAEROBIC Blood Culture results may not be optimal due to an inadequate volume of blood received in culture bottles Performed at [facility], [address] [place], [place] [address]    SETUPTIME  01/20/2024 09:02 PM    GRAM POSITIVE COCCI IN BOTH AEROBIC AND ANAEROBIC BOTTLES CRITICAL VALUE NOTED.  VALUE IS CONSISTENT WITH PREVIOUSLY REPORTED AND CALLED VALUE. Performed at [facility], [address] [place], [place] [address]    SETUPTIME  01/20/2024 10:54 AM    Organism ID to follow GRAM POSITIVE COCCI IN BOTH AEROBIC AND ANAEROBIC BOTTLES CRITICAL RESULT CALLED TO, READ BACK BY AND VERIFIED WITH: JASON ROBBINS 01/21/24 0504 MW GRAM STAIN REVIEWED-AGREE WITH RESULT DRT Performed at [facility], [address], [place], [place] [address]    Elpidio Anis POSITIVE COCCI 01/20/2024 09:02 PM   CULT GRAM POSITIVE COCCI 01/20/2024 10:54 AM   REPTSTATUS PENDING 01/20/2024 09:02 PM   REPTSTATUS PENDING 01/20/2024 10:54 AM   01-23-2024 Blood cx grew staph epi and staph hominis from different bottles. I still think this is a blood cx contaminant.  Repeat blood cx pending. 01-24-2024 repeat blood negative. WBC 3.6 today. Afebrile. I still think her initial blood cx were contaminated since they grew two different organisms.  Staph hominis and staph epi   01-25-2024 repeat blood cx are negative. I think staph epi and staph hominis were both blood culture contaminant.  Acute metabolic encephalopathy On admission,  + generalized encephalopathy on presentation. Suspect likely multifactorial w/ contributions of AKI, UTI, chronic dementia. Unclear of baseline. CT head WNL. Monitor 01-21-2024 pt is awake and alert. She knows that she has been living at [facility] for several years now. Knows that she is in the hospital. 01-22-2024 resolved. Pt is AxOx3 now   Hypokalemia 01-21-2024 replete with po kcl 40 meq q4h prn.  01-22-2024 resolved. K 3.6 today  Benign essential HTN On admission, BP stable. Titrate home regimen. 01-21-2024 stable. 01-22-2024 stable 01-23-2024 stable. On norvasc 10 mg daily. Can restart ACEI if BP starts to rise but while on bactrim would be cautious. 01-24-2024 stable. On norvasc 10 mg daily. 01-25-2024 stable. On norvasc 10 mg daily.   Class 2 obesity 01-21-2024 BMI 34.23 01-22-2024 stable 01-23-2024 stable 01-25-2024 stable  Hypothyroidism On admission, Cont synthroid. 01-21-2024 continue synthroid 25 mg daily. 01-22-2024 stable 01-23-2024 stable 01-24-2024 stable  01-25-2024 stable       DVT prophylaxis:   Lovenox   Code Status: Full Code Family Communication: no family at bedside.  Attempted to call pt's dtr crystal @ [phone].  No answer. Disposition Plan: SNF Reason for continuing need for hospitalization: medically stable for DC.  Objective: Vitals:   01/25/24 1524 01/25/24 2039 01/26/24 0357 01/26/24 0832  BP: 133/69 (!) 171/83 129/72 137/67  Pulse: 81 87 73 82  Resp: 16 20 20 16   Temp: 99.2 F (37.3 C) 98.5 F (36.9 C) 98.1 F (36.7 C) 98.1 F (36.7 C)  TempSrc: Oral Oral Oral Oral  SpO2: 97% 92% 95% 95%  Weight:      Height:        Intake/Output Summary (Last 24 hours) at 01/26/2024 0935 Last data filed at 01/25/2024 2113 Gross per 24 hour  Intake --  Output 2350 ml  Net -2350 ml   Filed Weights   01/20/24 0918  Weight: 96.2 kg    Examination:  Physical Exam Vitals and nursing note reviewed.  Constitutional:      General:  She is not in acute distress.    Appearance: She is not toxic-appearing.  HENT:     Head: Normocephalic and atraumatic.     Nose: Nose normal.  Cardiovascular:     Rate and Rhythm: Normal rate and regular rhythm.  Pulmonary:     Effort: Pulmonary effort is normal.     Breath sounds: Normal breath sounds.  Abdominal:     General: Bowel sounds are normal. There is no distension.     Palpations: Abdomen is soft.  Musculoskeletal:     Right lower leg: No edema.     Left lower leg: No edema.  Skin:    General: Skin is warm and dry.     Capillary Refill: Capillary refill takes less than 2 seconds.  Neurological:     Mental Status: She is alert.     Data Reviewed: I have personally reviewed following labs and imaging studies  CBC: Recent Labs  Lab 01/20/24 0903 01/21/24 0455 01/22/24 0702 01/24/24 0606  WBC 6.5 6.1 4.1 3.6*  NEUTROABS  --   --  2.2 1.5*  HGB 15.3* 14.1 13.0 12.9  HCT 45.6 42.2 38.1 38.7  MCV 90.5 91.9 89.0 91.1  PLT 261 195 205 212   Basic Metabolic Panel: Recent Labs  Lab 01/20/24 0903 01/21/24 0455 01/22/24 0702 01/24/24 0606  NA 138 137 138 137  K 3.1* 3.1* 3.6 3.5  CL 100 101 104 102  CO2 25 25 24 24   GLUCOSE 99 114* 88 78  BUN 30* 22 10 8   CREATININE 1.64* 0.89 0.64 0.64  CALCIUM 8.6* 8.4* 8.3* 8.5*   GFR: Estimated Creatinine Clearance: 76.5 mL/min (by C-G formula based on SCr of 0.64 mg/dL). Liver Function Tests: Recent Labs  Lab 01/20/24 0903 01/21/24 0455 01/22/24 0702 01/24/24 0606  AST 23 26 18  33  ALT 18 21 17 29   ALKPHOS 76 70 64 62  BILITOT 0.5 0.4 0.6 0.6  PROT 6.8 6.2* 5.5* 5.7*  ALBUMIN 3.3* 2.9* 2.7* 2.7*    Recent Labs  Lab 01/20/24 1020  AMMONIA 17   Coagulation Profile: Recent Labs  Lab 01/20/24 0903  INR 1.0   BNP (last 3 results) Recent Labs    01/20/24 0903  BNP 25.8   CBG: Recent Labs  Lab 01/23/24 1209  GLUCAP 105*   Sepsis Labs: Recent Labs  Lab 01/20/24 1124 01/21/24 2107   LATICACIDVEN 2.4* 2.1*    Recent Results (from the past 240 hours)  Blood culture (routine x 2)     Status: Abnormal   Collection Time: 01/20/24 10:54 AM  Specimen: BLOOD RIGHT FOREARM  Result Value Ref Range Status   Specimen Description   Final    BLOOD RIGHT FOREARM Performed at [facility], [address], [place], [place] [address]    Special Requests   Final    BOTTLES DRAWN AEROBIC AND ANAEROBIC Blood Culture results may not be optimal due to an inadequate volume of blood received in culture bottles Performed at [facility], [address] [place], [place] [address]    Culture  Setup Time   Final    Organism ID to follow GRAM POSITIVE COCCI IN BOTH AEROBIC AND ANAEROBIC BOTTLES CRITICAL RESULT CALLED TO, READ BACK BY AND VERIFIED WITH: JASON ROBBINS 01/21/24 0504 MW GRAM STAIN REVIEWED-AGREE WITH RESULT DRT Performed at [facility], [address] [place], [place] [address]    Culture (A)  Final    STAPHYLOCOCCUS EPIDERMIDIS THE SIGNIFICANCE OF ISOLATING THIS ORGANISM FROM A SINGLE SET OF BLOOD CULTURES WHEN MULTIPLE SETS ARE DRAWN IS UNCERTAIN. PLEASE NOTIFY THE MICROBIOLOGY DEPARTMENT WITHIN ONE WEEK IF SPECIATION AND SENSITIVITIES ARE REQUIRED. STAPHYLOCOCCUS HOMINIS    Report Status 01/23/2024 FINAL  Final   Organism ID, Bacteria STAPHYLOCOCCUS HOMINIS  Final      Susceptibility   Staphylococcus hominis - MIC*    CIPROFLOXACIN <=0.5 SENSITIVE Sensitive     ERYTHROMYCIN <=0.25 SENSITIVE Sensitive     GENTAMICIN <=0.5 SENSITIVE Sensitive     OXACILLIN <=0.25 SENSITIVE Sensitive     TETRACYCLINE 2 SENSITIVE Sensitive     VANCOMYCIN <=0.5 SENSITIVE Sensitive     TRIMETH/SULFA <=10 SENSITIVE Sensitive     CLINDAMYCIN <=0.25 SENSITIVE Sensitive     RIFAMPIN <=0.5 SENSITIVE Sensitive     Inducible Clindamycin NEGATIVE Sensitive     * STAPHYLOCOCCUS HOMINIS  Blood Culture ID Panel (Reflexed)     Status: Abnormal   Collection Time: 01/20/24  10:54 AM  Result Value Ref Range Status   Enterococcus faecalis NOT DETECTED NOT DETECTED Final   Enterococcus Faecium NOT DETECTED NOT DETECTED Final   Listeria monocytogenes NOT DETECTED NOT DETECTED Final   Staphylococcus species DETECTED (A) NOT DETECTED Final    Comment: CRITICAL RESULT CALLED TO, READ BACK BY AND VERIFIED WITH: JASON ROBBINS 01/21/24 0504 MW    Staphylococcus aureus (BCID) NOT DETECTED NOT DETECTED Final   Staphylococcus epidermidis NOT DETECTED NOT DETECTED Final   Staphylococcus lugdunensis NOT DETECTED NOT DETECTED Final   Streptococcus species NOT DETECTED NOT DETECTED Final   Streptococcus agalactiae NOT DETECTED NOT DETECTED Final   Streptococcus pneumoniae NOT DETECTED NOT DETECTED Final   Streptococcus pyogenes NOT DETECTED NOT DETECTED Final   A.calcoaceticus-baumannii NOT DETECTED NOT DETECTED Final   Bacteroides fragilis NOT DETECTED NOT DETECTED Final   Enterobacterales NOT DETECTED NOT DETECTED Final   Enterobacter cloacae complex NOT DETECTED NOT DETECTED Final   Escherichia coli NOT DETECTED NOT DETECTED Final   Klebsiella aerogenes NOT DETECTED NOT DETECTED Final   Klebsiella oxytoca NOT DETECTED NOT DETECTED Final   Klebsiella pneumoniae NOT DETECTED NOT DETECTED Final   Proteus species NOT DETECTED NOT DETECTED Final   Salmonella species NOT DETECTED NOT DETECTED Final   Serratia marcescens NOT DETECTED NOT DETECTED Final   Haemophilus influenzae NOT DETECTED NOT DETECTED Final   Neisseria meningitidis NOT DETECTED NOT DETECTED Final   Pseudomonas aeruginosa NOT DETECTED NOT DETECTED Final   Stenotrophomonas maltophilia NOT DETECTED NOT DETECTED Final   Candida albicans NOT DETECTED NOT DETECTED Final   Candida auris NOT  DETECTED NOT DETECTED Final   Candida glabrata NOT DETECTED NOT DETECTED Final   Candida krusei NOT DETECTED NOT DETECTED Final   Candida parapsilosis NOT DETECTED NOT DETECTED Final   Candida tropicalis NOT DETECTED NOT  DETECTED Final   Cryptococcus neoformans/gattii NOT DETECTED NOT DETECTED Final    Comment: Performed at [facility], [address] [place], [place] [address]  Resp panel by RT-PCR (RSV, Flu A&B, Covid) Anterior Nasal Swab     Status: None   Collection Time: 01/20/24 11:00 AM   Specimen: Anterior Nasal Swab  Result Value Ref Range Status   SARS Coronavirus 2 by RT PCR NEGATIVE NEGATIVE Final    Comment: (NOTE) SARS-CoV-2 target nucleic acids are NOT DETECTED.  The SARS-CoV-2 RNA is generally detectable in upper respiratory specimens during the acute phase of infection. The lowest concentration of SARS-CoV-2 viral copies this assay can detect is 138 copies/mL. A negative result does not preclude SARS-Cov-2 infection and should not be used as the sole basis for treatment or other patient management decisions. A negative result may occur with  improper specimen collection/handling, submission of specimen other than nasopharyngeal swab, presence of viral mutation(s) within the areas targeted by this assay, and inadequate number of viral copies(<138 copies/mL). A negative result must be combined with clinical observations, patient history, and epidemiological information. The expected result is Negative.  Fact Sheet for Patients:  BloggerCourse.com  Fact Sheet for Healthcare Providers:  SeriousBroker.it  This test is no t yet approved or cleared by the [place] FDA and  has been authorized for detection and/or diagnosis of SARS-CoV-2 by FDA under an Emergency Use Authorization (EUA). This EUA will remain  in effect (meaning this test can be used) for the duration of the COVID-19 declaration under Section 564(b)(1) of the Act, 21 U.S.C.section 360bbb-3(b)(1), unless the authorization is terminated  or revoked sooner.       Influenza A by PCR NEGATIVE NEGATIVE Final   Influenza B by PCR NEGATIVE NEGATIVE Final     Comment: (NOTE) The Xpert Xpress SARS-CoV-2/FLU/RSV plus assay is intended as an aid in the diagnosis of influenza from Nasopharyngeal swab specimens and should not be used as a sole basis for treatment. Nasal washings and aspirates are unacceptable for Xpert Xpress SARS-CoV-2/FLU/RSV testing.  Fact Sheet for Patients: BloggerCourse.com  Fact Sheet for Healthcare Providers: SeriousBroker.it  This test is not yet approved or cleared by the [place] FDA and has been authorized for detection and/or diagnosis of SARS-CoV-2 by FDA under an Emergency Use Authorization (EUA). This EUA will remain in effect (meaning this test can be used) for the duration of the COVID-19 declaration under Section 564(b)(1) of the Act, 21 U.S.C. section 360bbb-3(b)(1), unless the authorization is terminated or revoked.     Resp Syncytial Virus by PCR NEGATIVE NEGATIVE Final    Comment: (NOTE) Fact Sheet for Patients: BloggerCourse.com  Fact Sheet for Healthcare Providers: SeriousBroker.it  This test is not yet approved or cleared by the [place] FDA and has been authorized for detection and/or diagnosis of SARS-CoV-2 by FDA under an Emergency Use Authorization (EUA). This EUA will remain in effect (meaning this test can be used) for the duration of the COVID-19 declaration under Section 564(b)(1) of the Act, 21 U.S.C. section 360bbb-3(b)(1), unless the authorization is terminated or revoked.  Performed at [facility], [address] [place], [place] [address]   Blood culture (routine x 2)     Status: Abnormal   Collection Time:  01/20/24  9:02 PM   Specimen: BLOOD LEFT WRIST  Result Value Ref Range Status   Specimen Description   Final    BLOOD LEFT WRIST Performed at [facility], [address], [place], [place] [address]    Special Requests   Final    BOTTLES DRAWN  AEROBIC AND ANAEROBIC Blood Culture adequate volume Performed at [facility], [address] [place], [place] [address]    Culture  Setup Time   Final    GRAM POSITIVE COCCI IN BOTH AEROBIC AND ANAEROBIC BOTTLES CRITICAL VALUE NOTED.  VALUE IS CONSISTENT WITH PREVIOUSLY REPORTED AND CALLED VALUE. Performed at [facility], [address] [place], [place] [address]    Culture (A)  Final    STAPHYLOCOCCUS HOMINIS STAPHYLOCOCCUS EPIDERMIDIS    Report Status 01/25/2024 FINAL  Final   Organism ID, Bacteria STAPHYLOCOCCUS HOMINIS  Final   Organism ID, Bacteria STAPHYLOCOCCUS EPIDERMIDIS  Final      Susceptibility   Staphylococcus epidermidis - MIC*    CIPROFLOXACIN >=8 RESISTANT Resistant     ERYTHROMYCIN >=8 RESISTANT Resistant     GENTAMICIN <=0.5 SENSITIVE Sensitive     OXACILLIN >=4 RESISTANT Resistant     TETRACYCLINE <=1 SENSITIVE Sensitive     VANCOMYCIN 2 SENSITIVE Sensitive     TRIMETH/SULFA 20 SENSITIVE Sensitive     CLINDAMYCIN RESISTANT Resistant     RIFAMPIN <=0.5 SENSITIVE Sensitive     Inducible Clindamycin POSITIVE Resistant     * STAPHYLOCOCCUS EPIDERMIDIS   Staphylococcus hominis - MIC*    CIPROFLOXACIN 4 RESISTANT Resistant     ERYTHROMYCIN RESISTANT Resistant     GENTAMICIN <=0.5 SENSITIVE Sensitive     OXACILLIN >=4 RESISTANT Resistant     TETRACYCLINE <=1 SENSITIVE Sensitive     VANCOMYCIN 1 SENSITIVE Sensitive     TRIMETH/SULFA <=10 SENSITIVE Sensitive     CLINDAMYCIN RESISTANT Resistant     RIFAMPIN <=0.5 SENSITIVE Sensitive     Inducible Clindamycin POSITIVE Resistant     * STAPHYLOCOCCUS HOMINIS  Culture, blood (Routine X 2) w Reflex to ID Panel     Status: None (Preliminary result)   Collection Time: 01/22/24 10:09 AM   Specimen: BLOOD  Result Value Ref Range Status   Specimen Description BLOOD BLOOD LEFT HAND  Final   Special Requests   Final    BOTTLES DRAWN AEROBIC AND ANAEROBIC Blood Culture results may not be optimal due  to an inadequate volume of blood received in culture bottles   Culture   Final    NO GROWTH 4 DAYS Performed at [facility], [address] [place], [place] [address]    Report Status PENDING  Incomplete  Culture, blood (Routine X 2) w Reflex to ID Panel     Status: None (Preliminary result)   Collection Time: 01/22/24 10:16 AM   Specimen: BLOOD  Result Value Ref Range Status   Specimen Description BLOOD BLOOD RIGHT HAND  Final   Special Requests   Final    BOTTLES DRAWN AEROBIC AND ANAEROBIC Blood Culture results may not be optimal due to an inadequate volume of blood received in culture bottles   Culture   Final    NO GROWTH 4 DAYS Performed at [facility], [address], [place], [place] [address]    Report Status PENDING  Incomplete     Radiology Studies: No results found.  Scheduled Meds:  amLODipine  10 mg Oral Daily   divalproex  250 mg  Oral BID   enoxaparin (LOVENOX) injection  45 mg Subcutaneous Q24H   levothyroxine  25 mcg Oral Q0600   lipase/protease/amylase  48,000 Units Oral TID AC   melatonin  10 mg Oral QHS   risperiDONE  2 mg Oral QHS   sodium chloride  1 g Oral BID   venlafaxine XR  150 mg Oral QHS   Continuous Infusions:   LOS: 6 days   Time spent: 40 minutes  Carollee Herter, DO  [facility]  01/26/2024, 9:35 AM

## 2024-01-26 NOTE — Care Management Important Message (Signed)
Important Message  Patient Details  Name: Jacqueline Williams MRN: [id number] Date of Birth: [birth year]   Important Message Given:  Yes - Medicare IM     Sherilyn Banker 01/26/2024, 1:59 PM

## 2024-01-27 LAB — CULTURE, BLOOD (ROUTINE X 2)
Culture: NO GROWTH
Culture: NO GROWTH
